# Patient Record
Sex: Female | Born: 1935
Health system: Southern US, Community
[De-identification: ages and names within clinical notes are randomized; demographics above are authoritative.]

## PROBLEM LIST (undated history)

## (undated) DIAGNOSIS — Z8711 Personal history of peptic ulcer disease: Secondary | ICD-10-CM

## (undated) DIAGNOSIS — F419 Anxiety disorder, unspecified: Secondary | ICD-10-CM

## (undated) DIAGNOSIS — Z8719 Personal history of other diseases of the digestive system: Secondary | ICD-10-CM

## (undated) DIAGNOSIS — N390 Urinary tract infection, site not specified: Secondary | ICD-10-CM

## (undated) DIAGNOSIS — M069 Rheumatoid arthritis, unspecified: Secondary | ICD-10-CM

## (undated) DIAGNOSIS — C801 Malignant (primary) neoplasm, unspecified: Secondary | ICD-10-CM

## (undated) DIAGNOSIS — K219 Gastro-esophageal reflux disease without esophagitis: Secondary | ICD-10-CM

## (undated) DIAGNOSIS — N19 Unspecified kidney failure: Secondary | ICD-10-CM

## (undated) DIAGNOSIS — N809 Endometriosis, unspecified: Secondary | ICD-10-CM

## (undated) HISTORY — DX: Personal history of other diseases of the digestive system: Z87.19

## (undated) HISTORY — PX: CATARACT EXTRACTION: SUR2

## (undated) HISTORY — DX: Personal history of peptic ulcer disease: Z87.11

## (undated) HISTORY — DX: Unspecified kidney failure: N19

## (undated) HISTORY — PX: CHOLECYSTECTOMY: SHX55

## (undated) HISTORY — DX: Gastro-esophageal reflux disease without esophagitis: K21.9

## (undated) HISTORY — PX: APPENDECTOMY: SHX54

## (undated) HISTORY — PX: ABDOMINAL HYSTERECTOMY: SHX81

## (undated) HISTORY — PX: TONSILLECTOMY: SUR1361

## (undated) HISTORY — DX: Endometriosis, unspecified: N80.9

## (undated) HISTORY — DX: Urinary tract infection, site not specified: N39.0

## (undated) HISTORY — PX: INNER EAR SURGERY: SHX679

## (undated) HISTORY — PX: COLON SURGERY: SHX602

## (undated) HISTORY — DX: Rheumatoid arthritis, unspecified: M06.9

---

## 2004-05-19 ENCOUNTER — Ambulatory Visit: Payer: Self-pay

## 2005-01-19 ENCOUNTER — Ambulatory Visit: Payer: Self-pay | Admitting: Physician Assistant

## 2006-09-27 ENCOUNTER — Ambulatory Visit: Payer: Self-pay

## 2007-12-23 ENCOUNTER — Ambulatory Visit: Payer: Self-pay

## 2009-05-24 ENCOUNTER — Ambulatory Visit: Payer: Self-pay

## 2010-05-26 ENCOUNTER — Ambulatory Visit: Payer: Self-pay | Admitting: Family Medicine

## 2011-06-01 ENCOUNTER — Ambulatory Visit: Payer: Self-pay | Admitting: Family Medicine

## 2012-06-04 ENCOUNTER — Ambulatory Visit: Payer: Self-pay | Admitting: Family Medicine

## 2012-06-20 ENCOUNTER — Ambulatory Visit: Payer: Self-pay | Admitting: Family Medicine

## 2013-01-22 ENCOUNTER — Ambulatory Visit: Payer: Self-pay | Admitting: Urology

## 2013-02-19 ENCOUNTER — Ambulatory Visit: Payer: Self-pay | Admitting: Obstetrics and Gynecology

## 2013-02-24 ENCOUNTER — Ambulatory Visit: Payer: Self-pay | Admitting: Obstetrics and Gynecology

## 2013-06-24 ENCOUNTER — Ambulatory Visit: Payer: Self-pay | Admitting: Family Medicine

## 2013-07-10 ENCOUNTER — Ambulatory Visit: Payer: Self-pay | Admitting: Gastroenterology

## 2013-07-11 ENCOUNTER — Ambulatory Visit: Payer: Self-pay | Admitting: Family Medicine

## 2013-07-11 LAB — PATHOLOGY REPORT

## 2014-07-17 NOTE — Op Note (Signed)
PATIENT NAME:  Heidi Arias, Heidi Arias MR#:  [id number] DATE OF BIRTH:  1932/03/16  DATE OF PROCEDURE:  02/24/2013  PREOPERATIVE DIAGNOSIS: Suburethral vaginal mass.  POSTOPERATIVE DIAGNOSIS: Suburethral vaginal mass.  PROCEDURE PERFORMED: Cystoscopy with excision of vaginal mass.  ANESTHESIA: General endotracheal.  SURGEON: Rubie Maid, MD   ASSISTANT: Malachi Paradise, MD  ESTIMATED BLOOD LOSS: Minimal.  OPERATIVE FLUIDS: 800 mL.  URINE OUTPUT: 300 mL.  COMPLICATIONS: None.  FINDINGS: A soft, 2 x 1 cm polypoid vascular vaginal mass that was flesh-colored, on the left anterior vaginal wall, near the suburethral region. There was no other drainage expressed. Cystoscopy revealed no gross abnormalities within the bladder, no evidence of suburethral diverticulum.  SPECIMENS: Vaginal mass.  PROCEDURE: The patient was taken to the Operating Room, where she was placed under general anesthesia without difficulty. She was placed in the dorsal lithotomy position, using candy-cane stirrups. She then was prepped and draped in the normal sterile fashion. A straight catheterization was performed. Next, a 0-degree cystoscope was inserted into the urethra and passed through to the bladder. No gross abnormalities were observed. On inspection of the bladder, both ureteral orifices were identified, and  no gross bladder abnormalities were noted.  The urethra was scanned, with no evidence of an ostium or diverticulum, thus confirming origin of mass from vagina.  The cystoscope was then removed from the urethra.  Next, a weighted speculum was then placed into the patient's vagina, and a sidewall retractor was placed on the left for better visualization. The mass was then palpated and then grasped, using an Allis clamp for manipulation. A 15-blade scalpel was used to excise the polypoid mass. After this the bovie was used for hemostasis.  Next, a interrupted sutures of 2-0 chromic were used to close the defect  of the excised mass. Good hemostasis was noted at this point.  The specimen was sent to pathology.   The weighted speculum and all other instruments were removed from the patient's vagina. Instrument and sponge counts were correct x2. The patient tolerated the procedure well, and was taken to the PACU stable condition.     ____________________________ Chesley Noon Marcelline Mates, MD asc:cg D: 02/24/2013 17:44:59 ET T: 02/24/2013 23:56:03 ET JOB#: [id number]  cc: Chesley Noon. Marcelline Mates, MD, <Dictator> Augusto Gamble MD ELECTRONICALLY SIGNED 02/25/2013 16:32

## 2014-09-23 ENCOUNTER — Other Ambulatory Visit: Payer: Self-pay | Admitting: Family Medicine

## 2014-09-23 DIAGNOSIS — Z1231 Encounter for screening mammogram for malignant neoplasm of breast: Secondary | ICD-10-CM

## 2014-09-29 ENCOUNTER — Ambulatory Visit
Admission: RE | Admit: 2014-09-29 | Discharge: 2014-09-29 | Disposition: A | Discharge status: Home / Self Care | Payer: Commercial Managed Care - HMO | Source: Ambulatory Visit | Attending: Family Medicine | Admitting: Family Medicine

## 2014-09-29 DIAGNOSIS — Z1231 Encounter for screening mammogram for malignant neoplasm of breast: Secondary | ICD-10-CM | POA: Insufficient documentation

## 2014-09-29 HISTORY — DX: Malignant (primary) neoplasm, unspecified: C80.1

## 2015-05-19 DIAGNOSIS — H7010 Chronic mastoiditis, unspecified ear: Secondary | ICD-10-CM | POA: Diagnosis not present

## 2015-05-19 DIAGNOSIS — H903 Sensorineural hearing loss, bilateral: Secondary | ICD-10-CM | POA: Diagnosis not present

## 2015-05-20 DIAGNOSIS — Z961 Presence of intraocular lens: Secondary | ICD-10-CM | POA: Diagnosis not present

## 2015-05-27 ENCOUNTER — Encounter: Payer: PRIVATE HEALTH INSURANCE | Admitting: Obstetrics and Gynecology

## 2015-06-17 ENCOUNTER — Encounter: Payer: Self-pay | Admitting: Obstetrics and Gynecology

## 2015-06-17 ENCOUNTER — Ambulatory Visit (INDEPENDENT_AMBULATORY_CARE_PROVIDER_SITE_OTHER): Payer: PRIVATE HEALTH INSURANCE | Admitting: Obstetrics and Gynecology

## 2015-06-17 VITALS — BP 133/72 | HR 76 | Ht 62.0 in | Wt 134.1 lb

## 2015-06-17 DIAGNOSIS — R6889 Other general symptoms and signs: Secondary | ICD-10-CM | POA: Diagnosis not present

## 2015-06-17 DIAGNOSIS — R159 Full incontinence of feces: Secondary | ICD-10-CM | POA: Diagnosis not present

## 2015-06-17 DIAGNOSIS — N952 Postmenopausal atrophic vaginitis: Secondary | ICD-10-CM | POA: Diagnosis not present

## 2015-06-17 DIAGNOSIS — R32 Unspecified urinary incontinence: Secondary | ICD-10-CM | POA: Diagnosis not present

## 2015-06-17 DIAGNOSIS — Z01419 Encounter for gynecological examination (general) (routine) without abnormal findings: Secondary | ICD-10-CM

## 2015-06-17 DIAGNOSIS — Z9181 History of falling: Secondary | ICD-10-CM | POA: Diagnosis not present

## 2015-06-17 NOTE — Patient Instructions (Signed)
Health Maintenance, Female Adopting a healthy lifestyle and getting preventive care can go a long way to promote health and wellness. Talk with your health care provider about what schedule of regular examinations is right for you. This is a good chance for you to check in with your provider about disease prevention and staying healthy. In between checkups, there are plenty of things you can do on your own. Experts have done a lot of research about which lifestyle changes and preventive measures are most likely to keep you healthy. Ask your health care provider for more information. WEIGHT AND DIET  Eat a healthy diet  Be sure to include plenty of vegetables, fruits, low-fat dairy products, and lean protein.  Do not eat a lot of foods high in solid fats, added sugars, or salt.  Get regular exercise. This is one of the most important things you can do for your health.  Most adults should exercise for at least 150 minutes each week. The exercise should increase your heart rate and make you sweat (moderate-intensity exercise).  Most adults should also do strengthening exercises at least twice a week. This is in addition to the moderate-intensity exercise.  Maintain a healthy weight  Body mass index (BMI) is a measurement that can be used to identify possible weight problems. It estimates body fat based on height and weight. Your health care provider can help determine your BMI and help you achieve or maintain a healthy weight.  For females 28 years of age and older:   A BMI below 18.5 is considered underweight.  A BMI of 18.5 to 24.9 is normal.  A BMI of 25 to 29.9 is considered overweight.  A BMI of 30 and above is considered obese.  Watch levels of cholesterol and blood lipids  You should start having your blood tested for lipids and cholesterol at 80 years of age, then have this test every 5 years.  You may need to have your cholesterol levels checked more often if:  Your lipid  or cholesterol levels are high.  You are older than 80 years of age.  You are at high risk for heart disease.  CANCER SCREENING   Lung Cancer  Lung cancer screening is recommended for adults 75-66 years old who are at high risk for lung cancer because of a history of smoking.  A yearly low-dose CT scan of the lungs is recommended for people who:  Currently smoke.  Have quit within the past 15 years.  Have at least a 30-pack-year history of smoking. A pack year is smoking an average of one pack of cigarettes a day for 1 year.  Yearly screening should continue until it has been 15 years since you quit.  Yearly screening should stop if you develop a health problem that would prevent you from having lung cancer treatment.  Breast Cancer  Practice breast self-awareness. This means understanding how your breasts normally appear and feel.  It also means doing regular breast self-exams. Let your health care provider know about any changes, no matter how small.  If you are in your 20s or 30s, you should have a clinical breast exam (CBE) by a health care provider every 1-3 years as part of a regular health exam.  If you are 25 or older, have a CBE every year. Also consider having a breast X-ray (mammogram) every year.  If you have a family history of breast cancer, talk to your health care provider about genetic screening.  If you  are at high risk for breast cancer, talk to your health care provider about having an MRI and a mammogram every year.  Breast cancer gene (BRCA) assessment is recommended for women who have family members with BRCA-related cancers. BRCA-related cancers include:  Breast.  Ovarian.  Tubal.  Peritoneal cancers.  Results of the assessment will determine the need for genetic counseling and BRCA1 and BRCA2 testing. Cervical Cancer Your health care provider may recommend that you be screened regularly for cancer of the pelvic organs (ovaries, uterus, and  vagina). This screening involves a pelvic examination, including checking for microscopic changes to the surface of your cervix (Pap test). You may be encouraged to have this screening done every 3 years, beginning at age 21.  For women ages 30-65, health care providers may recommend pelvic exams and Pap testing every 3 years, or they may recommend the Pap and pelvic exam, combined with testing for human papilloma virus (HPV), every 5 years. Some types of HPV increase your risk of cervical cancer. Testing for HPV may also be done on women of any age with unclear Pap test results.  Other health care providers may not recommend any screening for nonpregnant women who are considered low risk for pelvic cancer and who do not have symptoms. Ask your health care provider if a screening pelvic exam is right for you.  If you have had past treatment for cervical cancer or a condition that could lead to cancer, you need Pap tests and screening for cancer for at least 20 years after your treatment. If Pap tests have been discontinued, your risk factors (such as having a new sexual partner) need to be reassessed to determine if screening should resume. Some women have medical problems that increase the chance of getting cervical cancer. In these cases, your health care provider may recommend more frequent screening and Pap tests. Colorectal Cancer  This type of cancer can be detected and often prevented.  Routine colorectal cancer screening usually begins at 80 years of age and continues through 80 years of age.  Your health care provider may recommend screening at an earlier age if you have risk factors for colon cancer.  Your health care provider may also recommend using home test kits to check for hidden blood in the stool.  A small camera at the end of a tube can be used to examine your colon directly (sigmoidoscopy or colonoscopy). This is done to check for the earliest forms of colorectal  cancer.  Routine screening usually begins at age 50.  Direct examination of the colon should be repeated every 5-10 years through 80 years of age. However, you may need to be screened more often if early forms of precancerous polyps or small growths are found. Skin Cancer  Check your skin from head to toe regularly.  Tell your health care provider about any new moles or changes in moles, especially if there is a change in a mole's shape or color.  Also tell your health care provider if you have a mole that is larger than the size of a pencil eraser.  Always use sunscreen. Apply sunscreen liberally and repeatedly throughout the day.  Protect yourself by wearing long sleeves, pants, a wide-brimmed hat, and sunglasses whenever you are outside. HEART DISEASE, DIABETES, AND HIGH BLOOD PRESSURE   High blood pressure causes heart disease and increases the risk of stroke. High blood pressure is more likely to develop in:  People who have blood pressure in the high end   of the normal range (130-139/85-89 mm Hg).  People who are overweight or obese.  People who are African American.  If you are 38-23 years of age, have your blood pressure checked every 3-5 years. If you are 61 years of age or older, have your blood pressure checked every year. You should have your blood pressure measured twice--once when you are at a hospital or clinic, and once when you are not at a hospital or clinic. Record the average of the two measurements. To check your blood pressure when you are not at a hospital or clinic, you can use:  An automated blood pressure machine at a pharmacy.  A home blood pressure monitor.  If you are between 45 years and 39 years old, ask your health care provider if you should take aspirin to prevent strokes.  Have regular diabetes screenings. This involves taking a blood sample to check your fasting blood sugar level.  If you are at a normal weight and have a low risk for diabetes,  have this test once every three years after 80 years of age.  If you are overweight and have a high risk for diabetes, consider being tested at a younger age or more often. PREVENTING INFECTION  Hepatitis B  If you have a higher risk for hepatitis B, you should be screened for this virus. You are considered at high risk for hepatitis B if:  You were born in a country where hepatitis B is common. Ask your health care provider which countries are considered high risk.  Your parents were born in a high-risk country, and you have not been immunized against hepatitis B (hepatitis B vaccine).  You have HIV or AIDS.  You use needles to inject street drugs.  You live with someone who has hepatitis B.  You have had sex with someone who has hepatitis B.  You get hemodialysis treatment.  You take certain medicines for conditions, including cancer, organ transplantation, and autoimmune conditions. Hepatitis C  Blood testing is recommended for:  Everyone born from [birth year] through 1963.  Anyone with known risk factors for hepatitis C. Sexually transmitted infections (STIs)  You should be screened for sexually transmitted infections (STIs) including gonorrhea and chlamydia if:  You are sexually active and are younger than 80 years of age.  You are older than 80 years of age and your health care provider tells you that you are at risk for this type of infection.  Your sexual activity has changed since you were last screened and you are at an increased risk for chlamydia or gonorrhea. Ask your health care provider if you are at risk.  If you do not have HIV, but are at risk, it may be recommended that you take a prescription medicine daily to prevent HIV infection. This is called pre-exposure prophylaxis (PrEP). You are considered at risk if:  You are sexually active and do not regularly use condoms or know the HIV status of your partner(s).  You take drugs by injection.  You are sexually  active with a partner who has HIV. Talk with your health care provider about whether you are at high risk of being infected with HIV. If you choose to begin PrEP, you should first be tested for HIV. You should then be tested every 3 months for as long as you are taking PrEP.  PREGNANCY   If you are premenopausal and you may become pregnant, ask your health care provider about preconception counseling.  If you may  PrEP). You are considered at risk if:    You are sexually active and do not regularly use condoms or know the HIV status of your partner(s).    You take drugs by injection.    You are sexually  active with a partner who has HIV.  Talk with your health care provider about whether you are at high risk of being infected with HIV. If you choose to begin PrEP, you should first be tested for HIV. You should then be tested every 3 months for as long as you are taking PrEP.   PREGNANCY   · If you are premenopausal and you may become pregnant, ask your health care provider about preconception counseling.  · If you may become pregnant, take 400 to 800 micrograms (mcg) of folic acid every day.  · If you want to prevent pregnancy, talk to your health care provider about birth control (contraception).  OSTEOPOROSIS AND MENOPAUSE   · Osteoporosis is a disease in which the bones lose minerals and strength with aging. This can result in serious bone fractures. Your risk for osteoporosis can be identified using a bone density scan.  · If you are 65 years of age or older, or if you are at risk for osteoporosis and fractures, ask your health care provider if you should be screened.  · Ask your health care provider whether you should take a calcium or vitamin D supplement to lower your risk for osteoporosis.  · Menopause may have certain physical symptoms and risks.  · Hormone replacement therapy may reduce some of these symptoms and risks.  Talk to your health care provider about whether hormone replacement therapy is right for you.   HOME CARE INSTRUCTIONS   · Schedule regular health, dental, and eye exams.  · Stay current with your immunizations.    · Do not use any tobacco products including cigarettes, chewing tobacco, or electronic cigarettes.  · If you are pregnant, do not drink alcohol.  · If you are breastfeeding, limit how much and how often you drink alcohol.  · Limit alcohol intake to no more than 1 drink per day for nonpregnant women. One drink equals 12 ounces of beer, 5 ounces of wine, or 1½ ounces of hard liquor.  · Do not use street drugs.  · Do not share needles.  · Ask your health care provider for help if  you need support or information about quitting drugs.  · Tell your health care provider if you often feel depressed.  · Tell your health care provider if you have ever been abused or do not feel safe at home.     This information is not intended to replace advice given to you by your health care provider. Make sure you discuss any questions you have with your health care provider.     Document Released: 09/26/2010 Document Revised: 04/03/2014 Document Reviewed: 02/12/2013  [employer]vier Interactive Patient Education ©2016 [employer]  Menopause is a normal process in which your reproductive ability comes to an end. This process happens gradually over a span of months to years, usually between the ages of 48 and 55. Menopause is complete when you have missed 12 consecutive menstrual periods.  It is important to talk with your health care provider about some of the most common conditions that affect postmenopausal women, such as heart disease, cancer, and bone loss (osteoporosis). Adopting a healthy lifestyle and getting preventive care can help to promote your health and wellness. Those actions can also   lower your chances of developing some of these common conditions.  WHAT SHOULD I KNOW ABOUT MENOPAUSE?  During menopause, you may experience a number of symptoms, such as:  · Moderate-to-severe hot flashes.  · Night sweats.  · Decrease in sex drive.  · Mood swings.  · Headaches.  · Tiredness.  · Irritability.  · Memory problems.  · Insomnia.  Choosing to treat or not to treat menopausal changes is an individual decision that you make with your health care provider.  WHAT SHOULD I KNOW ABOUT HORMONE REPLACEMENT THERAPY AND SUPPLEMENTS?  Hormone therapy products are effective for treating symptoms that are associated with menopause, such as hot flashes and night sweats. Hormone replacement carries certain risks, especially as you become older. If you are thinking about using estrogen or estrogen with progestin treatments,  discuss the benefits and risks with your health care provider.  WHAT SHOULD I KNOW ABOUT HEART DISEASE AND STROKE?  Heart disease, heart attack, and stroke become more likely as you age. This may be due, in part, to the hormonal changes that your body experiences during menopause. These can affect how your body processes dietary fats, triglycerides, and cholesterol. Heart attack and stroke are both medical emergencies.  There are many things that you can do to help prevent heart disease and stroke:  · Have your blood pressure checked at least every 1-2 years. High blood pressure causes heart disease and increases the risk of stroke.  · If you are 55-79 years old, ask your health care provider if you should take aspirin to prevent a heart attack or a stroke.  · Do not use any tobacco products, including cigarettes, chewing tobacco, or electronic cigarettes. If you need help quitting, ask your health care provider.  · It is important to eat a healthy diet and maintain a healthy weight.    Be sure to include plenty of vegetables, fruits, low-fat dairy products, and lean protein.    Avoid eating foods that are high in solid fats, added sugars, or salt (sodium).  · Get regular exercise. This is one of the most important things that you can do for your health.    Try to exercise for at least 150 minutes each week. The type of exercise that you do should increase your heart rate and make you sweat. This is known as moderate-intensity exercise.    Try to do strengthening exercises at least twice each week. Do these in addition to the moderate-intensity exercise.  · Know your numbers. Ask your health care provider to check your cholesterol and your blood glucose. Continue to have your blood tested as directed by your health care provider.  WHAT SHOULD I KNOW ABOUT CANCER SCREENING?  There are several types of cancer. Take the following steps to reduce your risk and to catch any cancer development as early as  possible.  Breast Cancer  · Practice breast self-awareness.    This means understanding how your breasts normally appear and feel.    It also means doing regular breast self-exams. Let your health care provider know about any changes, no matter how small.  · If you are 40 or older, have a clinician do a breast exam (clinical breast exam or CBE) every year. Depending on your age, family history, and medical history, it may be recommended that you also have a yearly breast X-ray (mammogram).  · If you have a family history of breast cancer, talk with your health care provider about genetic screening.  ·   If you are at high risk for breast cancer, talk with your health care provider about having an MRI and a mammogram every year.  · Breast cancer (BRCA) gene test is recommended for women who have family members with BRCA-related cancers. Results of the assessment will determine the need for genetic counseling and BRCA1 and for BRCA2 testing. BRCA-related cancers include these types:    Breast. This occurs in males or females.    Ovarian.    Tubal. This may also be called fallopian tube cancer.    Cancer of the abdominal or pelvic lining (peritoneal cancer).    Prostate.    Pancreatic.  Cervical, Uterine, and Ovarian Cancer  Your health care provider may recommend that you be screened regularly for cancer of the pelvic organs. These include your ovaries, uterus, and vagina. This screening involves a pelvic exam, which includes checking for microscopic changes to the surface of your cervix (Pap test).  · For women ages 21-65, health care providers may recommend a pelvic exam and a Pap test every three years. For women ages 30-65, they may recommend the Pap test and pelvic exam, combined with testing for human papilloma virus (HPV), every five years. Some types of HPV increase your risk of cervical cancer. Testing for HPV may also be done on women of any age who have unclear Pap test results.  · Other health care providers  may not recommend any screening for nonpregnant women who are considered low risk for pelvic cancer and have no symptoms. Ask your health care provider if a screening pelvic exam is right for you.  · If you have had past treatment for cervical cancer or a condition that could lead to cancer, you need Pap tests and screening for cancer for at least 20 years after your treatment. If Pap tests have been discontinued for you, your risk factors (such as having a new sexual partner) need to be reassessed to determine if you should start having screenings again. Some women have medical problems that increase the chance of getting cervical cancer. In these cases, your health care provider may recommend that you have screening and Pap tests more often.  · If you have a family history of uterine cancer or ovarian cancer, talk with your health care provider about genetic screening.  · If you have vaginal bleeding after reaching menopause, tell your health care provider.  · There are currently no reliable tests available to screen for ovarian cancer.  Lung Cancer  Lung cancer screening is recommended for adults 55-80 years old who are at high risk for lung cancer because of a history of smoking. A yearly low-dose CT scan of the lungs is recommended if you:  · Currently smoke.  · Have a history of at least 30 pack-years of smoking and you currently smoke or have quit within the past 15 years. A pack-year is smoking an average of one pack of cigarettes per day for one year.  Yearly screening should:  · Continue until it has been 15 years since you quit.  · Stop if you develop a health problem that would prevent you from having lung cancer treatment.  Colorectal Cancer  · This type of cancer can be detected and can often be prevented.  · Routine colorectal cancer screening usually begins at age 50 and continues through age 75.  · If you have risk factors for colon cancer, your health care provider may recommend that you be  screened at an earlier age.  ·   If you have a family history of colorectal cancer, talk with your health care provider about genetic screening.  · Your health care provider may also recommend using home test kits to check for hidden blood in your stool.  · A small camera at the end of a tube can be used to examine your colon directly (sigmoidoscopy or colonoscopy). This is done to check for the earliest forms of colorectal cancer.  · Direct examination of the colon should be repeated every 5-10 years until age 75. However, if early forms of precancerous polyps or small growths are found or if you have a family history or genetic risk for colorectal cancer, you may need to be screened more often.  Skin Cancer  · Check your skin from head to toe regularly.  · Monitor any moles. Be sure to tell your health care provider:    About any new moles or changes in moles, especially if there is a change in a mole's shape or color.    If you have a mole that is larger than the size of a pencil eraser.  · If any of your family members has a history of skin cancer, especially at a young age, talk with your health care provider about genetic screening.  · Always use sunscreen. Apply sunscreen liberally and repeatedly throughout the day.  · Whenever you are outside, protect yourself by wearing long sleeves, pants, a wide-brimmed hat, and sunglasses.  WHAT SHOULD I KNOW ABOUT OSTEOPOROSIS?  Osteoporosis is a condition in which bone destruction happens more quickly than new bone creation. After menopause, you may be at an increased risk for osteoporosis. To help prevent osteoporosis or the bone fractures that can happen because of osteoporosis, the following is recommended:  · If you are 19-50 years old, get at least 1,000 mg of calcium and at least 600 mg of vitamin D per day.  · If you are older than age 50 but younger than age 70, get at least 1,200 mg of calcium and at least 600 mg of vitamin D per day.  · If you are older than  age 70, get at least 1,200 mg of calcium and at least 800 mg of vitamin D per day.  Smoking and excessive alcohol intake increase the risk of osteoporosis. Eat foods that are rich in calcium and vitamin D, and do weight-bearing exercises several times each week as directed by your health care provider.  WHAT SHOULD I KNOW ABOUT HOW MENOPAUSE AFFECTS MY MENTAL HEALTH?  Depression may occur at any age, but it is more common as you become older. Common symptoms of depression include:  · Low or sad mood.  · Changes in sleep patterns.  · Changes in appetite or eating patterns.  · Feeling an overall lack of motivation or enjoyment of activities that you previously enjoyed.  · Frequent crying spells.  Talk with your health care provider if you think that you are experiencing depression.  WHAT SHOULD I KNOW ABOUT IMMUNIZATIONS?  It is important that you get and maintain your immunizations. These include:  · Tetanus, diphtheria, and pertussis (Tdap) booster vaccine.  · Influenza every year before the flu season begins.  · Pneumonia vaccine.  · Shingles vaccine.  Your health care provider may also recommend other immunizations.     This information is not intended to replace advice given to you by your health care provider. Make sure you discuss any questions you have with your health care provider.

## 2015-06-17 NOTE — Progress Notes (Signed)
ANNUAL PREVENTATIVE CARE GYN  ENCOUNTER NOTE  Subjective:       Heidi Arias is a 82 y.o. P65 female here for a routine annual gynecologic exam. Denies postmenopausal symptoms.  Is not on hormone replacement therapy.  Denies postmenopausal bleeding. Does not currently participate in regular exercise. The patient is not sexually active. She denies history of domestic violence. Current complaints: 1.  Intermittent mild urinary and bowel incontinence over past 2-3 months.    Gynecologic History No LMP recorded. Patient has had a hysterectomy. Last Pap: cannot recall, but over 30 years ago. Results were: normal Last mammogram: 04/2014. Results were: normal Last Colonoscopy: ~ 2 years ago.  Results were normal.  Obstetric History OB History  Gravida Para Term Preterm AB SAB TAB Ectopic Multiple Living  2 2 2       2     # Outcome Date GA Lbr Len/2nd Weight Sex Delivery Anes PTL Lv  2 Term           1 Term               Past Medical History  Diagnosis Date  . Cancer ([place])     melanoma  . Recurrent UTI (urinary tract infection)   . GERD (gastroesophageal reflux disease)   . Endometriosis   . Rheumatoid arthritis ([place])     History reviewed. No pertinent family history.   Past Surgical History  Procedure Laterality Date  . Appendectomy    . Colon surgery    . Cataract extraction    . Cholecystectomy    . Abdominal hysterectomy    . Tonsillectomy      Social History   Social History  . Marital Status: Widowed    Spouse Name: N/A  . Number of Children: N/A  . Years of Education: N/A   Occupational History  . Not on file.   Social History Main Topics  . Smoking status: Never Smoker   . Smokeless tobacco: Not on file  . Alcohol Use: No  . Drug Use: No  . Sexual Activity: No   Other Topics Concern  . Not on file   Social History Narrative    Outpatient Encounter Prescriptions as of 06/17/2015  Medication Sig Note  . omeprazole (PRILOSEC) 20 MG capsule   06/17/2015: Received from: [facility]  . PREMARIN vaginal cream  06/17/2015: Received from: External Pharmacy  . sertraline (ZOLOFT) 50 MG tablet  06/17/2015: Received from: [facility]  . [DISCONTINUED] omeprazole (PRILOSEC) 20 MG capsule  06/17/2015: Received from: External Pharmacy  . [DISCONTINUED] sertraline (ZOLOFT) 50 MG tablet  06/17/2015: Received from: External Pharmacy   No facility-administered encounter medications on file as of 06/17/2015.     Allergies  Allergen Reactions  . Codeine Sulfate     Other reaction(s): Unknown  . Doxycycline Hyclate Rash  . Penicillin G Rash     The following portions of the patient's history were reviewed and updated as appropriate: allergies, current medications, past family history, past medical history, past social history, past surgical history and problem list.  Review of Systems ROS Review of Systems - General ROS: negative for - chills, fatigue, fever, hot flashes, night sweats, weight gain or weight loss Psychological ROS: negative for - anxiety, decreased libido, depression, mood swings, physical abuse or sexual abuse Ophthalmic ROS: negative for - blurry vision, eye pain or loss of vision ENT ROS: negative for - headaches, hearing change, visual changes or vocal changes Allergy and  Immunology ROS: negative for - hives, itchy/watery eyes or seasonal allergies Hematological and Lymphatic ROS: negative for - bleeding problems, bruising, swollen lymph nodes or weight loss Endocrine ROS: positive for cold intolerance.  negative for - galactorrhea, hair pattern changes, hot flashes, malaise/lethargy, mood swings, palpitations, polydipsia/polyuria, skin changes, temperature intolerance or unexpected weight changes Breast ROS: negative for - new or changing breast lumps or nipple discharge Respiratory ROS: negative for - cough or shortness of breath Cardiovascular ROS: negative for - chest pain, irregular  heartbeat, palpitations or shortness of breath Gastrointestinal ROS: positive for intermittent bowel incontinence.  No abdominal pain, change in bowel habits, or black or bloody stools Genito-Urinary ROS: positive for intermittent urinary incontinence. No dysuria, trouble voiding, or hematuria Musculoskeletal ROS: negative for - joint pain or joint stiffness Neurological ROS: positive for falls. Negative for - bowel and bladder control changes Dermatological ROS: negative for rash and skin lesion changes   Objective:   BP 133/72 mmHg  Pulse 76  Wt 134 lb 1.6 oz (60.827 kg) Body mass index is 24.52 kg/(m^2).  CONSTITUTIONAL: Well-developed, well-nourished female in no acute distress.  PSYCHIATRIC: Normal mood and affect. Normal behavior. Normal judgment and thought content. [place]: Alert and oriented to person, place, and time. Normal muscle tone coordination. No cranial nerve deficit noted. HENT:  Normocephalic, atraumatic, External right and left ear normal. Oropharynx is clear and moist EYES: Conjunctivae and EOM are normal. Pupils are equal, round, and reactive to light. No scleral icterus.  NECK: Normal range of motion, supple, no masses.  Normal thyroid.  SKIN: Skin is warm and dry. No rash noted. Not diaphoretic. No erythema. No pallor. CARDIOVASCULAR: Normal heart rate noted, regular rhythm, no murmur. RESPIRATORY: Clear to auscultation bilaterally. Effort and breath sounds normal, no problems with respiration noted. BREASTS: Symmetric in size. No masses, skin changes, nipple drainage, or lymphadenopathy. ABDOMEN: Soft, normal bowel sounds, no distention noted.  No tenderness, rebound or guarding.  BLADDER: Normal PELVIC:  External Genitalia: Normal  BUS: Normal  Vagina: Vaginal mucosa atrophic. Grade 1-2 cystocele present, Grade 1 rectocele.   Cervix: Surgically absent  Uterus: Surgically absent  Adnexa: Normal  RV: External Exam NormaI, No Rectal Masses and Normal  Sphincter tone  MUSCULOSKELETAL: Normal range of motion. No tenderness.  No cyanosis, clubbing, or edema.  2+ distal pulses. LYMPHATIC: No Axillary, Supraclavicular, or Inguinal Adenopathy.    Assessment:   Annual gynecologic examination 82 y.o. Contraception: status post hysterectomy Normal BMI  Vaginl atrophy Intermittent urinary and bowel incontinence Cold intolerance History of falls  Plan:  Pap: Not needed Mammogram: Completed 04/2014, was normal.  Stool Guaiac Testing:  Not Ordered.  Patient notes colonoscopy within past few years.  Labs: CBC, Complete Metabolic Panel, Vitamin D level, and TSH  DEXA scan ordered. Patient notes that she has never had one. Routine preventative health maintenance measures emphasized: Exercise/Diet/Weight control and Stress Management.  Advised on Vit D and calcium supplementation.  Patient notes using Premarin cream occasionally.  Discussed recent h/o falls (patient notes losing her balance more often).  Has been seen by ENT (has h/o bilateral eustachian tubes).  PCP also aware. Discussed Kegel exercises for incontinence.  Can refer to pelvic physical therapy, or can consider use of pessary.  Patient to schedule appointment if she desires to disucss management further.  Return to [place], MD

## 2015-06-18 LAB — COMPREHENSIVE METABOLIC PANEL
ALK PHOS: 86 IU/L (ref 39–117)
ALT: 6 IU/L (ref 0–32)
AST: 13 IU/L (ref 0–40)
Albumin/Globulin Ratio: 1.5 (ref 1.2–2.2)
Albumin: 4.1 g/dL (ref 3.5–4.7)
BUN/Creatinine Ratio: 20 (ref 11–26)
BUN: 19 mg/dL (ref 8–27)
CHLORIDE: 101 mmol/L (ref 96–106)
CO2: 18 mmol/L (ref 18–29)
Calcium: 9.6 mg/dL (ref 8.7–10.3)
Creatinine, Ser: 0.94 mg/dL (ref 0.57–1.00)
GFR calc Af Amer: 66 mL/min/{1.73_m2} (ref >59)
GFR calc non Af Amer: 57 mL/min/{1.73_m2} — ABNORMAL LOW (ref >59)
GLUCOSE: 81 mg/dL (ref 65–99)
Globulin, Total: 2.8 g/dL (ref 1.5–4.5)
Potassium: 4.7 mmol/L (ref 3.5–5.2)
Sodium: 138 mmol/L (ref 134–144)
TOTAL PROTEIN: 6.9 g/dL (ref 6.0–8.5)

## 2015-06-18 LAB — CBC
Hematocrit: 27.8 % — ABNORMAL LOW (ref 34.0–46.6)
Hemoglobin: 8.4 g/dL — ABNORMAL LOW (ref 11.1–15.9)
MCH: 20.9 pg — AB (ref 26.6–33.0)
MCHC: 30.2 g/dL — ABNORMAL LOW (ref 31.5–35.7)
MCV: 69 fL — ABNORMAL LOW (ref 79–97)
PLATELETS: 514 10*3/uL — AB (ref 150–379)
RBC: 4.02 x10E6/uL (ref 3.77–5.28)
RDW: 16.6 % — AB (ref 12.3–15.4)
WBC: 8.8 10*3/uL (ref 3.4–10.8)

## 2015-06-18 LAB — VITAMIN D 25 HYDROXY (VIT D DEFICIENCY, FRACTURES): VIT D 25 HYDROXY: 15.8 ng/mL — AB (ref 30.0–100.0)

## 2015-06-18 LAB — TSH: TSH: 2.13 u[IU]/mL (ref 0.450–4.500)

## 2015-06-19 ENCOUNTER — Encounter: Payer: Self-pay | Admitting: Obstetrics and Gynecology

## 2015-06-21 ENCOUNTER — Other Ambulatory Visit: Payer: Self-pay

## 2015-06-21 ENCOUNTER — Telehealth: Payer: Self-pay

## 2015-06-21 DIAGNOSIS — E559 Vitamin D deficiency, unspecified: Secondary | ICD-10-CM

## 2015-06-21 DIAGNOSIS — D509 Iron deficiency anemia, unspecified: Secondary | ICD-10-CM

## 2015-06-21 MED ORDER — FERROUS SULFATE 325 (65 FE) MG PO TABS: 325.0000 mg | ORAL_TABLET | Freq: Two times a day (BID) | ORAL | Status: DC

## 2015-06-21 MED ORDER — CALCIUM-MAGNESIUM-VITAMIN D ER 600-40-500 MG-MG-UNIT PO TB24: ORAL_TABLET | ORAL | Status: DC

## 2015-06-21 MED ORDER — FERROUS SULFATE 325 (65 FE) MG PO TABS
325.0000 mg | ORAL_TABLET | Freq: Two times a day (BID) | ORAL | Status: DC
Start: 2015-06-21 — End: 2015-06-21

## 2015-06-21 NOTE — Telephone Encounter (Signed)
-----   Message from Rubie Maid, MD sent at 06/18/2015  8:28 AM EDT ----- Please inform patient of moderate anemia.  Needs to begin taking iron tablets twice daily, and colace for constipation as needed. Should also f/u with PCP for further workup of anemia. Also with low vitamin d levels.  Would benefit from a dailyVit D + calcium daily supplement.

## 2015-06-21 NOTE — Telephone Encounter (Signed)
Called pt informed her of the need for supplements, rx sent in as pt states pharmacy delivers her medications and pt is hard of hearing.

## 2015-07-02 ENCOUNTER — Telehealth: Payer: Self-pay | Admitting: Obstetrics and Gynecology

## 2015-07-02 ENCOUNTER — Other Ambulatory Visit: Payer: Self-pay

## 2015-07-02 NOTE — Telephone Encounter (Signed)
Called Anderson Malta at corrected number [phone]. No answer, LM for Anderson Malta giving her the updated dx code Z87.898.

## 2015-07-02 NOTE — Telephone Encounter (Signed)
Bone Density ordered ..diagnosis code does not qualify for bone density. Please call Anderson Malta @ [place] [phone]

## 2015-07-07 DIAGNOSIS — D508 Other iron deficiency anemias: Secondary | ICD-10-CM | POA: Diagnosis not present

## 2015-07-07 DIAGNOSIS — F419 Anxiety disorder, unspecified: Secondary | ICD-10-CM | POA: Diagnosis not present

## 2015-07-07 DIAGNOSIS — R7989 Other specified abnormal findings of blood chemistry: Secondary | ICD-10-CM | POA: Diagnosis not present

## 2015-09-14 DIAGNOSIS — N390 Urinary tract infection, site not specified: Secondary | ICD-10-CM | POA: Diagnosis not present

## 2015-09-14 DIAGNOSIS — R300 Dysuria: Secondary | ICD-10-CM | POA: Diagnosis not present

## 2015-09-21 DIAGNOSIS — B370 Candidal stomatitis: Secondary | ICD-10-CM | POA: Diagnosis not present

## 2015-10-14 DIAGNOSIS — N390 Urinary tract infection, site not specified: Secondary | ICD-10-CM | POA: Diagnosis not present

## 2015-10-18 ENCOUNTER — Other Ambulatory Visit: Payer: Self-pay | Admitting: Family Medicine

## 2015-10-18 DIAGNOSIS — Z1231 Encounter for screening mammogram for malignant neoplasm of breast: Secondary | ICD-10-CM

## 2015-10-26 ENCOUNTER — Other Ambulatory Visit: Payer: Self-pay | Admitting: Family Medicine

## 2015-10-26 DIAGNOSIS — R1031 Right lower quadrant pain: Secondary | ICD-10-CM | POA: Diagnosis not present

## 2015-10-26 DIAGNOSIS — R1032 Left lower quadrant pain: Secondary | ICD-10-CM | POA: Diagnosis not present

## 2015-10-26 DIAGNOSIS — N390 Urinary tract infection, site not specified: Secondary | ICD-10-CM | POA: Diagnosis not present

## 2015-10-26 DIAGNOSIS — R300 Dysuria: Secondary | ICD-10-CM | POA: Diagnosis not present

## 2015-10-26 DIAGNOSIS — Z8742 Personal history of other diseases of the female genital tract: Secondary | ICD-10-CM

## 2015-10-28 ENCOUNTER — Ambulatory Visit
Admission: RE | Admit: 2015-10-28 | Discharge: 2015-10-28 | Disposition: A | Discharge status: Home / Self Care | Payer: PPO | Source: Ambulatory Visit | Attending: Family Medicine | Admitting: Family Medicine

## 2015-10-28 ENCOUNTER — Other Ambulatory Visit: Payer: Self-pay | Admitting: Family Medicine

## 2015-10-28 DIAGNOSIS — Z8742 Personal history of other diseases of the female genital tract: Secondary | ICD-10-CM | POA: Diagnosis not present

## 2015-10-28 DIAGNOSIS — Z90710 Acquired absence of both cervix and uterus: Secondary | ICD-10-CM | POA: Diagnosis not present

## 2015-10-28 DIAGNOSIS — R1032 Left lower quadrant pain: Principal | ICD-10-CM

## 2015-10-28 DIAGNOSIS — R1031 Right lower quadrant pain: Secondary | ICD-10-CM | POA: Diagnosis not present

## 2015-10-28 DIAGNOSIS — R1030 Lower abdominal pain, unspecified: Secondary | ICD-10-CM | POA: Diagnosis not present

## 2015-11-03 ENCOUNTER — Ambulatory Visit
Admission: RE | Admit: 2015-11-03 | Discharge: 2015-11-03 | Disposition: A | Discharge status: Home / Self Care | Payer: PPO | Source: Ambulatory Visit | Attending: Family Medicine | Admitting: Family Medicine

## 2015-11-03 DIAGNOSIS — Z1231 Encounter for screening mammogram for malignant neoplasm of breast: Secondary | ICD-10-CM | POA: Insufficient documentation

## 2015-11-10 DIAGNOSIS — M79602 Pain in left arm: Secondary | ICD-10-CM | POA: Diagnosis not present

## 2015-11-10 DIAGNOSIS — S42252A Displaced fracture of greater tuberosity of left humerus, initial encounter for closed fracture: Secondary | ICD-10-CM | POA: Diagnosis not present

## 2015-11-16 DIAGNOSIS — H7013 Chronic mastoiditis, bilateral: Secondary | ICD-10-CM | POA: Diagnosis not present

## 2015-11-16 DIAGNOSIS — H903 Sensorineural hearing loss, bilateral: Secondary | ICD-10-CM | POA: Diagnosis not present

## 2016-01-07 DIAGNOSIS — D649 Anemia, unspecified: Secondary | ICD-10-CM | POA: Diagnosis not present

## 2016-01-07 DIAGNOSIS — L989 Disorder of the skin and subcutaneous tissue, unspecified: Secondary | ICD-10-CM | POA: Diagnosis not present

## 2016-01-07 DIAGNOSIS — K219 Gastro-esophageal reflux disease without esophagitis: Secondary | ICD-10-CM | POA: Diagnosis not present

## 2016-01-07 DIAGNOSIS — F419 Anxiety disorder, unspecified: Secondary | ICD-10-CM | POA: Diagnosis not present

## 2016-01-12 ENCOUNTER — Encounter: Payer: Self-pay | Admitting: *Deleted

## 2016-01-12 ENCOUNTER — Observation Stay
Admission: EM | Admit: 2016-01-12 | Discharge: 2016-01-12 | Disposition: A | Discharge status: Home / Self Care | Payer: PPO | Attending: Internal Medicine | Admitting: Internal Medicine

## 2016-01-12 DIAGNOSIS — Z79899 Other long term (current) drug therapy: Secondary | ICD-10-CM | POA: Insufficient documentation

## 2016-01-12 DIAGNOSIS — K219 Gastro-esophageal reflux disease without esophagitis: Secondary | ICD-10-CM | POA: Diagnosis not present

## 2016-01-12 DIAGNOSIS — M069 Rheumatoid arthritis, unspecified: Secondary | ICD-10-CM | POA: Insufficient documentation

## 2016-01-12 DIAGNOSIS — D649 Anemia, unspecified: Secondary | ICD-10-CM

## 2016-01-12 DIAGNOSIS — Z85820 Personal history of malignant melanoma of skin: Secondary | ICD-10-CM | POA: Diagnosis not present

## 2016-01-12 DIAGNOSIS — F419 Anxiety disorder, unspecified: Secondary | ICD-10-CM | POA: Insufficient documentation

## 2016-01-12 DIAGNOSIS — Z87440 Personal history of urinary (tract) infections: Secondary | ICD-10-CM | POA: Insufficient documentation

## 2016-01-12 DIAGNOSIS — D509 Iron deficiency anemia, unspecified: Secondary | ICD-10-CM | POA: Diagnosis present

## 2016-01-12 DIAGNOSIS — R5383 Other fatigue: Secondary | ICD-10-CM | POA: Diagnosis not present

## 2016-01-12 DIAGNOSIS — D508 Other iron deficiency anemias: Secondary | ICD-10-CM | POA: Diagnosis not present

## 2016-01-12 DIAGNOSIS — F329 Major depressive disorder, single episode, unspecified: Secondary | ICD-10-CM | POA: Diagnosis not present

## 2016-01-12 HISTORY — DX: Anxiety disorder, unspecified: F41.9

## 2016-01-12 LAB — COMPREHENSIVE METABOLIC PANEL
ALK PHOS: 68 U/L (ref 38–126)
ALT: 12 U/L — AB (ref 14–54)
ANION GAP: 7 (ref 5–15)
AST: 17 U/L (ref 15–41)
Albumin: 3.6 g/dL (ref 3.5–5.0)
BUN: 13 mg/dL (ref 6–20)
CALCIUM: 8.8 mg/dL — AB (ref 8.9–10.3)
CHLORIDE: 108 mmol/L (ref 101–111)
CO2: 23 mmol/L (ref 22–32)
CREATININE: 0.83 mg/dL (ref 0.44–1.00)
Glucose, Bld: 104 mg/dL — ABNORMAL HIGH (ref 65–99)
Potassium: 3.6 mmol/L (ref 3.5–5.1)
SODIUM: 138 mmol/L (ref 135–145)
Total Bilirubin: 0.2 mg/dL — ABNORMAL LOW (ref 0.3–1.2)
Total Protein: 7 g/dL (ref 6.5–8.1)

## 2016-01-12 LAB — CBC
HCT: 23.5 % — ABNORMAL LOW (ref 35.0–47.0)
HEMOGLOBIN: 7.3 g/dL — AB (ref 12.0–16.0)
MCH: 20.1 pg — ABNORMAL LOW (ref 26.0–34.0)
MCHC: 31 g/dL — ABNORMAL LOW (ref 32.0–36.0)
MCV: 64.7 fL — ABNORMAL LOW (ref 80.0–100.0)
PLATELETS: 445 10*3/uL — AB (ref 150–440)
RBC: 3.63 MIL/uL — AB (ref 3.80–5.20)
RDW: 19 % — ABNORMAL HIGH (ref 11.5–14.5)
WBC: 7 10*3/uL (ref 3.6–11.0)

## 2016-01-12 LAB — LACTATE DEHYDROGENASE: LDH: 155 U/L (ref 98–192)

## 2016-01-12 LAB — RETICULOCYTES
RBC.: 3.73 MIL/uL — AB (ref 3.80–5.20)
RETIC COUNT ABSOLUTE: 41 10*3/uL (ref 19.0–183.0)
RETIC CT PCT: 1.1 % (ref 0.4–3.1)

## 2016-01-12 LAB — ABO/RH: ABO/RH(D): O POS

## 2016-01-12 LAB — VITAMIN B12: VITAMIN B 12: 304 pg/mL (ref 180–914)

## 2016-01-12 LAB — PREPARE RBC (CROSSMATCH)

## 2016-01-12 MED ORDER — IRON (FERROUS GLUCONATE) 256 (28 FE) MG PO TABS: 1.0000 | ORAL_TABLET | Freq: Every day | ORAL | 0 refills | Status: DC

## 2016-01-12 MED ORDER — ESTROGENS, CONJUGATED 0.625 MG/GM VA CREA: 1.0000 | TOPICAL_CREAM | Freq: Every day | VAGINAL | Status: DC

## 2016-01-12 MED ORDER — SERTRALINE HCL 50 MG PO TABS: 50.0000 mg | ORAL_TABLET | Freq: Every day | ORAL | Status: DC

## 2016-01-12 MED ORDER — ACETAMINOPHEN 325 MG PO TABS: 650.0000 mg | ORAL_TABLET | Freq: Four times a day (QID) | ORAL | Status: DC | PRN

## 2016-01-12 MED ORDER — ACETAMINOPHEN 650 MG RE SUPP: 650.0000 mg | Freq: Four times a day (QID) | RECTAL | Status: DC | PRN

## 2016-01-12 MED ORDER — SODIUM CHLORIDE 0.9 % IV SOLN
25.0000 mg | Freq: Once | INTRAVENOUS | Status: DC
  Filled 2016-01-12: qty 0.5

## 2016-01-12 MED ORDER — PANTOPRAZOLE SODIUM 40 MG PO TBEC
40.0000 mg | DELAYED_RELEASE_TABLET | Freq: Every day | ORAL | Status: DC
  Administered 2016-01-12: 40 mg via ORAL
  Filled 2016-01-12: qty 1

## 2016-01-12 MED ORDER — SODIUM CHLORIDE 0.9 % IV SOLN
400.0000 mg | Freq: Once | INTRAVENOUS | Status: AC
  Administered 2016-01-12: 400 mg via INTRAVENOUS
  Filled 2016-01-12: qty 20

## 2016-01-12 MED ORDER — FUROSEMIDE 10 MG/ML IJ SOLN
40.0000 mg | Freq: Once | INTRAMUSCULAR | Status: AC
  Administered 2016-01-12: 40 mg via INTRAVENOUS
  Filled 2016-01-12: qty 4

## 2016-01-12 MED ORDER — SODIUM CHLORIDE 0.9 % IV SOLN: 10.0000 mL/h | Freq: Once | INTRAVENOUS | Status: DC

## 2016-01-12 NOTE — Consult Note (Signed)
[facility] [place] NAME: Heidi Arias    MR#:  [id number]  DATE OF BIRTH:  Nov 18, 1931  DATE OF ADMISSION:  01/12/2016  PRIMARY CARE PHYSICIAN: Sherrin Daisy, MD   REQUESTING/REFERRING PHYSICIAN: Dr Merlyn Lot  CHIEF COMPLAINT:   Chief Complaint  Patient presents with  . Fatigue    HISTORY OF PRESENT ILLNESS:  Heidi Arias  is a 82 y.o. female was told to come in secondary to low blood counts. In the ER her hemoglobin was 7.3. Prior ferritin was 3 as outpatient. Patient does not notice any blood loss. She does feel weak with walking. Her legs cramp at night. She does complain of some fatigue. In the ER she was guaiac negative. Hospitalist services were contacted for further evaluation. I contacted Dr. Vira Agar gastroenterology and he said he could not do procedures today or tomorrow. She can follow-up as outpatient with him next week.  PAST MEDICAL HISTORY:   Past Medical History:  Diagnosis Date  . Anxiety   . Cancer ([place])    melanoma  . Endometriosis   . GERD (gastroesophageal reflux disease)   . Recurrent UTI (urinary tract infection)   . Rheumatoid arthritis ([place])     PAST SURGICAL HISTOIRY:   Past Surgical History:  Procedure Laterality Date  . ABDOMINAL HYSTERECTOMY    . APPENDECTOMY    . CATARACT EXTRACTION    . CHOLECYSTECTOMY    . COLON SURGERY    . TONSILLECTOMY      SOCIAL HISTORY:   Social History  Substance Use Topics  . Smoking status: Never Smoker  . Smokeless tobacco: Not on file  . Alcohol use No    FAMILY HISTORY:   Family History  Problem Relation Age of Onset  . CVA Father   . Breast cancer Neg Hx     DRUG ALLERGIES:   Allergies  Allergen Reactions  . Codeine Sulfate     Other reaction(s): Unknown  . Doxycycline Hyclate Rash  . Penicillin G Rash    REVIEW OF SYSTEMS:  CONSTITUTIONAL: No fever. Positive for fatigue. Cold feeling EYES: No blurred or double vision.  Wears glasses EARS, NOSE, AND THROAT: No tinnitus or ear pain. Has a hearing aid. Positive for runny nose. Positive for sore throat. Occasional dysphagia to solids RESPIRATORY: No cough, shortness of breath, wheezing or hemoptysis.  CARDIOVASCULAR: No chest pain, orthopnea, edema.  GASTROINTESTINAL: No nausea, vomiting, or abdominal pain. Occasional diarrhea GENITOURINARY: No dysuria, hematuria.  ENDOCRINE: No polyuria, nocturia,  HEMATOLOGY: Recent anemia SKIN: No rash or lesion. MUSCULOSKELETAL: Positive for joint pain NEUROLOGIC: No tingling, numbness, weakness.  PSYCHIATRY: No anxiety or depression.   MEDICATIONS AT HOME:   Prior to Admission medications   Medication Sig Start Date End Date Taking? Authorizing Provider  Calcium-Magnesium-Vitamin D [id number] MG-MG-UNIT TB24 Take 1 daily bu mouth 06/21/15   Rubie Maid, MD  ferrous sulfate 325 (65 FE) MG tablet Take 1 tablet (325 mg total) by mouth 2 (two) times daily with a meal. 06/21/15   Rubie Maid, MD  omeprazole (PRILOSEC) 20 MG capsule  05/13/15   Historical Provider, MD  PREMARIN vaginal cream  06/14/15   Historical Provider, MD  sertraline (ZOLOFT) 50 MG tablet  05/13/15   Historical Provider, MD      VITAL SIGNS:  Blood pressure 140/63, pulse 66, temperature 98.6 F (37 C), temperature source Oral, resp. rate 14, height 5\' 2"  (1.575 m), weight 62.6 kg (138 lb), SpO2 100 %.  PHYSICAL EXAMINATION:  GENERAL:  82 y.o.-year-old patient lying in the bed with no acute distress.  EYES: Pupils equal, round, reactive to light and accommodation. No scleral icterus. Extraocular muscles intact.  HEENT: Head atraumatic, normocephalic. Oropharynx and nasopharynx clear.  NECK:  Supple, no jugular venous distention. No thyroid enlargement, no tenderness.  LUNGS: Normal breath sounds bilaterally, no wheezing, rales,rhonchi or crepitation. No use of accessory muscles of respiration.  CARDIOVASCULAR: S1, S2 normal. No murmurs, rubs, or  gallops.  ABDOMEN: Soft, nontender, nondistended. Bowel sounds present. No organomegaly or mass.  EXTREMITIES: No pedal edema, cyanosis, or clubbing.  NEUROLOGIC: Cranial nerves II through XII are intact. Muscle strength 5/5 in all extremities. Sensation intact. Gait not checked.  PSYCHIATRIC: The patient is alert and oriented x 3.  SKIN: No obvious rash, lesion, or ulcer.   LABORATORY PANEL:   CBC  Recent Labs Lab 01/12/16 1044  WBC 7.0  HGB 7.3*  HCT 23.5*  PLT 445*   ------------------------------------------------------------------------------------------------------------------  Chemistries   Recent Labs Lab 01/12/16 1044  NA 138  K 3.6  CL 108  CO2 23  GLUCOSE 104*  BUN 13  CREATININE 0.83  CALCIUM 8.8*  AST 17  ALT 12*  ALKPHOS 68  BILITOT 0.2*   ------------------------------------------------------------------------------------------------------------------   IMPRESSION AND PLAN:   1. Iron deficiency anemia that is symptomatic with fatigue. Patient is guaiac negative. Case discussed with Dr. Vira Agar and he will unable to do an endoscopy today or tomorrow. Can follow-up in the office with him next week. ER physician ordered a transfusion of blood which we will do here in the ER. I will also give IV venofer for 400 mg IV 1. Continue iron as outpatient. 2. GERD and occasional dysphasia on PPI 3. Anxiety on Zoloft 4. Arthritis  All the records are reviewed and case discussed with Consulting provider. Management plans discussed with the patient, family and they are in agreement.  CODE STATUS: Full Code  TOTAL TIME TAKING CARE OF THIS PATIENT: 50 minutes.    Loletha Grayer M.D on 01/12/2016 at 1:42 PM  Between 7am to 6pm - Pager - [phone]  After 6pm go to [email or web address] - password [email or web address]  [facility]  Office  [phone]  CC: Primary care Physician: Sherrin Daisy, MD

## 2016-01-12 NOTE — ED Provider Notes (Signed)
-----------------------------------------   6:37 PM on 01/12/2016 -----------------------------------------   Blood pressure (!) 146/72, pulse 68, temperature 98.4 F (36.9 C), temperature source Oral, resp. rate 19, height 5\' 2"  (1.575 m), weight 138 lb (62.6 kg), SpO2 99 %.  Assuming care from Dr. Quentin Cornwall of [facility] [place] is a 82 y.o. female with a chief complaint of Fatigue .    82 year old female history of deficiency anemia who presented with severe fatigue and weakness. Patient was found to be anemic with hgb 7.3. Patient was initially admitted by Dr. Quentin Cornwall however due to lack of bed availability patient received 1 unit of PRBCs and IV iron infusion in the emergency room. He signed out to me and requested I recheck her vital signs and reassess patient after those infusions were done. Patient has been observed for an hour post infusion. She has no signs or symptoms of allergic reaction. Her vital signs are within normal limits. Patient feels markedly improved. Patient be discharged home with instructions provided by Dr. Quentin Cornwall and close follow-up with primary care doctor.   Rudene Re, MD 01/12/16 1840

## 2016-01-12 NOTE — ED Notes (Signed)
Pt assisted to restroom. Ambulated well with one assist. NAD noted.

## 2016-01-12 NOTE — ED Provider Notes (Signed)
[facility] Emergency Department Provider Note    First MD Initiated Contact with Patient 01/12/16 1156     (approximate)  I have reviewed the triage vital signs and the nursing notes.   HISTORY  Chief Complaint Fatigue    HPI Heidi Arias is a 82 y.o. female with a history of iron deficiency anemia presents with severe fatigue and weakness. Patient presented from primary care due to acutely abnormal low hemoglobin and report of worsening symptomatic anemia. Denies any melena or hematochezia. Does have a history of iron deficiency anemia. Denies any history of recurrent malignancy or lymphomas. Denies any shortness of breath or chest pain. Denies any fevers. No abdominal pain. History of diverticulitis.   Past Medical History:  Diagnosis Date  . Cancer ([place])    melanoma  . Endometriosis   . GERD (gastroesophageal reflux disease)   . Recurrent UTI (urinary tract infection)   . Rheumatoid arthritis ([place])     There are no active problems to display for this patient.   Past Surgical History:  Procedure Laterality Date  . ABDOMINAL HYSTERECTOMY    . APPENDECTOMY    . CATARACT EXTRACTION    . CHOLECYSTECTOMY    . COLON SURGERY    . TONSILLECTOMY      Prior to Admission medications   Medication Sig Start Date End Date Taking? Authorizing Provider  Calcium-Magnesium-Vitamin D [id number] MG-MG-UNIT TB24 Take 1 daily bu mouth 06/21/15   Rubie Maid, MD  ferrous sulfate 325 (65 FE) MG tablet Take 1 tablet (325 mg total) by mouth 2 (two) times daily with a meal. 06/21/15   Rubie Maid, MD  omeprazole (PRILOSEC) 20 MG capsule  05/13/15   Historical Provider, MD  PREMARIN vaginal cream  06/14/15   Historical Provider, MD  sertraline (ZOLOFT) 50 MG tablet  05/13/15   Historical Provider, MD    Allergies Codeine sulfate; Doxycycline hyclate; and Penicillin g  Family History  Problem Relation Age of Onset  . Breast cancer Neg Hx     Social  History Social History  Substance Use Topics  . Smoking status: Never Smoker  . Smokeless tobacco: Not on file  . Alcohol use No    Review of Systems Patient denies headaches, rhinorrhea, blurry vision, numbness, shortness of breath, chest pain, edema, cough, abdominal pain, nausea, vomiting, diarrhea, dysuria, fevers, rashes or hallucinations unless otherwise stated above in HPI. ____________________________________________   PHYSICAL EXAM:  VITAL SIGNS: Vitals:   01/12/16 1231 01/12/16 1232  BP: 140/63   Pulse:  66  Resp: 13 14  Temp:      Constitutional: Alert and oriented. Well appearing and in no acute distress. Eyes: Conjunctivae are pale. PERRL. EOMI. Head: Atraumatic. Nose: No congestion/rhinnorhea. Mouth/Throat: Mucous membranes are moist.  Oropharynx non-erythematous. Neck: No stridor. Painless ROM. No cervical spine tenderness to palpation Hematological/Lymphatic/Immunilogical: No cervical lymphadenopathy. Cardiovascular: Normal rate, regular rhythm. Grossly normal heart sounds.  Good peripheral circulation. Respiratory: Normal respiratory effort.  No retractions. Lungs CTAB. Gastrointestinal: Soft and nontender. No distention. No abdominal bruits. No CVA tenderness. Genitourinary:  Musculoskeletal: No lower extremity tenderness nor edema.  No joint effusions. Neurologic:  Normal speech and language. No gross focal neurologic deficits are appreciated. No gait instability. Skin:  Skin is warm, dry and intact. Pale , No rash noted. Psychiatric: Mood and affect are normal. Speech and behavior are normal. _______________________________________   LABS (all labs ordered are listed, but only abnormal results are displayed)  Results for orders placed  or performed during the hospital encounter of 01/12/16 (from the past 24 hour(s))  Comprehensive metabolic panel     Status: Abnormal   Collection Time: 01/12/16 10:44 AM  Result Value Ref Range   Sodium 138 135 - 145  mmol/L   Potassium 3.6 3.5 - 5.1 mmol/L   Chloride 108 101 - 111 mmol/L   CO2 23 22 - 32 mmol/L   Glucose, Bld 104 (H) 65 - 99 mg/dL   BUN 13 6 - 20 mg/dL   Creatinine, Ser 0.83 0.44 - 1.00 mg/dL   Calcium 8.8 (L) 8.9 - 10.3 mg/dL   Total Protein 7.0 6.5 - 8.1 g/dL   Albumin 3.6 3.5 - 5.0 g/dL   AST 17 15 - 41 U/L   ALT 12 (L) 14 - 54 U/L   Alkaline Phosphatase 68 38 - 126 U/L   Total Bilirubin 0.2 (L) 0.3 - 1.2 mg/dL   GFR calc non Af Amer >60 >60 mL/min   GFR calc Af Amer >60 >60 mL/min   Anion gap 7 5 - 15  CBC     Status: Abnormal   Collection Time: 01/12/16 10:44 AM  Result Value Ref Range   WBC 7.0 3.6 - 11.0 K/uL   RBC 3.63 (L) 3.80 - 5.20 MIL/uL   Hemoglobin 7.3 (L) 12.0 - 16.0 g/dL   HCT 23.5 (L) 35.0 - 47.0 %   MCV 64.7 (L) 80.0 - 100.0 fL   MCH 20.1 (L) 26.0 - 34.0 pg   MCHC 31.0 (L) 32.0 - 36.0 g/dL   RDW 19.0 (H) 11.5 - 14.5 %   Platelets 445 (H) 150 - 440 K/uL  Type and screen [facility]     Status: None   Collection Time: 01/12/16 10:45 AM  Result Value Ref Range   ABO/RH(D) O POS    Antibody Screen NEG    Sample Expiration 01/15/2016    ____________________________________________ ____________________________________________  RADIOLOGY   ____________________________________________   PROCEDURES  Procedure(s) performed: none    Critical Care performed: no ____________________________________________   INITIAL IMPRESSION / ASSESSMENT AND PLAN / ED COURSE  Pertinent labs & imaging results that were available during my care of the patient were reviewed by me and considered in my medical decision making (see chart for details).  DDX: ida, gi bleed, electrolyte abn,  malignancy  Heidi Arias is a 82 y.o. who presents to the ED with central back anemia. No evidence of GI bleed as she is guaiac negative. She is otherwise he mechanically stable but is having symptoms. Review of her chart does show profound iron  deficiency anemia. Based on her symptoms will transfuse 1 unit as well as give a test dose of IV iron. Patient will be admitted for further evaluation and management.  Have discussed with the patient and available family all diagnostics and treatments performed thus far and all questions were answered to the best of my ability. The patient demonstrates understanding and agreement with plan.   Clinical Course     ____________________________________________   FINAL CLINICAL IMPRESSION(S) / ED DIAGNOSES  Final diagnoses:  Other iron deficiency anemia  Symptomatic anemia      NEW MEDICATIONS STARTED DURING THIS VISIT:  New Prescriptions   No medications on file     Note:  This document was prepared using Dragon voice recognition software and may include unintentional dictation errors.    Merlyn Lot, MD 01/12/16 [phone]

## 2016-01-12 NOTE — ED Triage Notes (Signed)
Pt sent by Dr Lavone Neri for a hemglobin of 6, pt complains of generalized weakness

## 2016-01-12 NOTE — ED Notes (Signed)
Hemoccult stool negative

## 2016-01-13 LAB — TYPE AND SCREEN
ABO/RH(D): O POS
Antibody Screen: NEGATIVE
Unit division: 0

## 2016-01-13 LAB — HAPTOGLOBIN: HAPTOGLOBIN: 232 mg/dL — AB (ref 34–200)

## 2016-01-19 DIAGNOSIS — K219 Gastro-esophageal reflux disease without esophagitis: Secondary | ICD-10-CM | POA: Diagnosis not present

## 2016-01-19 DIAGNOSIS — D509 Iron deficiency anemia, unspecified: Secondary | ICD-10-CM | POA: Diagnosis not present

## 2016-01-19 DIAGNOSIS — Z8619 Personal history of other infectious and parasitic diseases: Secondary | ICD-10-CM | POA: Diagnosis not present

## 2016-01-27 DIAGNOSIS — L821 Other seborrheic keratosis: Secondary | ICD-10-CM | POA: Diagnosis not present

## 2016-01-27 DIAGNOSIS — L570 Actinic keratosis: Secondary | ICD-10-CM | POA: Diagnosis not present

## 2016-01-28 DIAGNOSIS — D509 Iron deficiency anemia, unspecified: Secondary | ICD-10-CM | POA: Diagnosis not present

## 2016-02-03 DIAGNOSIS — M79602 Pain in left arm: Secondary | ICD-10-CM | POA: Diagnosis not present

## 2016-02-03 DIAGNOSIS — S42255D Nondisplaced fracture of greater tuberosity of left humerus, subsequent encounter for fracture with routine healing: Secondary | ICD-10-CM | POA: Diagnosis not present

## 2016-02-03 DIAGNOSIS — S46002A Unspecified injury of muscle(s) and tendon(s) of the rotator cuff of left shoulder, initial encounter: Secondary | ICD-10-CM | POA: Diagnosis not present

## 2016-02-05 ENCOUNTER — Encounter: Payer: Self-pay | Admitting: Emergency Medicine

## 2016-02-05 ENCOUNTER — Emergency Department: Payer: PPO

## 2016-02-05 ENCOUNTER — Emergency Department
Admission: EM | Admit: 2016-02-05 | Discharge: 2016-02-05 | Disposition: A | Discharge status: Home / Self Care | Payer: PPO | Attending: Emergency Medicine | Admitting: Emergency Medicine

## 2016-02-05 DIAGNOSIS — Z79899 Other long term (current) drug therapy: Secondary | ICD-10-CM | POA: Insufficient documentation

## 2016-02-05 DIAGNOSIS — N390 Urinary tract infection, site not specified: Secondary | ICD-10-CM | POA: Diagnosis not present

## 2016-02-05 DIAGNOSIS — W19XXXA Unspecified fall, initial encounter: Secondary | ICD-10-CM | POA: Diagnosis not present

## 2016-02-05 DIAGNOSIS — Y929 Unspecified place or not applicable: Secondary | ICD-10-CM | POA: Diagnosis not present

## 2016-02-05 DIAGNOSIS — W1839XA Other fall on same level, initial encounter: Secondary | ICD-10-CM | POA: Diagnosis not present

## 2016-02-05 DIAGNOSIS — Y939 Activity, unspecified: Secondary | ICD-10-CM | POA: Insufficient documentation

## 2016-02-05 DIAGNOSIS — M25551 Pain in right hip: Secondary | ICD-10-CM | POA: Diagnosis not present

## 2016-02-05 DIAGNOSIS — S79911A Unspecified injury of right hip, initial encounter: Secondary | ICD-10-CM | POA: Diagnosis not present

## 2016-02-05 DIAGNOSIS — Y999 Unspecified external cause status: Secondary | ICD-10-CM | POA: Diagnosis not present

## 2016-02-05 DIAGNOSIS — Z8579 Personal history of other malignant neoplasms of lymphoid, hematopoietic and related tissues: Secondary | ICD-10-CM | POA: Insufficient documentation

## 2016-02-05 DIAGNOSIS — G9389 Other specified disorders of brain: Secondary | ICD-10-CM | POA: Diagnosis not present

## 2016-02-05 DIAGNOSIS — R319 Hematuria, unspecified: Secondary | ICD-10-CM

## 2016-02-05 DIAGNOSIS — M545 Low back pain: Secondary | ICD-10-CM | POA: Diagnosis not present

## 2016-02-05 DIAGNOSIS — S3992XA Unspecified injury of lower back, initial encounter: Secondary | ICD-10-CM | POA: Diagnosis not present

## 2016-02-05 DIAGNOSIS — S0990XA Unspecified injury of head, initial encounter: Secondary | ICD-10-CM | POA: Diagnosis not present

## 2016-02-05 LAB — CBC WITH DIFFERENTIAL/PLATELET
BASOS PCT: 1 %
Basophils Absolute: 0.1 10*3/uL (ref 0–0.1)
EOS ABS: 0 10*3/uL (ref 0–0.7)
Eosinophils Relative: 0 %
HCT: 36.2 % (ref 35.0–47.0)
HEMOGLOBIN: 11.3 g/dL — AB (ref 12.0–16.0)
Lymphocytes Relative: 8 %
Lymphs Abs: 1 10*3/uL (ref 1.0–3.6)
MCH: 23 pg — ABNORMAL LOW (ref 26.0–34.0)
MCHC: 31.1 g/dL — AB (ref 32.0–36.0)
MCV: 73.8 fL — ABNORMAL LOW (ref 80.0–100.0)
MONOS PCT: 7 %
Monocytes Absolute: 0.9 10*3/uL (ref 0.2–0.9)
NEUTROS PCT: 84 %
Neutro Abs: 10.8 10*3/uL — ABNORMAL HIGH (ref 1.4–6.5)
Platelets: 424 10*3/uL (ref 150–440)
RBC: 4.9 MIL/uL (ref 3.80–5.20)
RDW: 28.5 % — AB (ref 11.5–14.5)
WBC: 12.9 10*3/uL — ABNORMAL HIGH (ref 3.6–11.0)

## 2016-02-05 LAB — URINALYSIS COMPLETE WITH MICROSCOPIC (ARMC ONLY)
Bilirubin Urine: NEGATIVE
GLUCOSE, UA: 150 mg/dL — AB
Nitrite: NEGATIVE
Protein, ur: 100 mg/dL — AB
Specific Gravity, Urine: 1.018 (ref 1.005–1.030)
pH: 6 (ref 5.0–8.0)

## 2016-02-05 LAB — CK: CK TOTAL: 992 U/L — AB (ref 38–234)

## 2016-02-05 LAB — BASIC METABOLIC PANEL
ANION GAP: 9 (ref 5–15)
BUN: 16 mg/dL (ref 6–20)
CALCIUM: 9.2 mg/dL (ref 8.9–10.3)
CO2: 23 mmol/L (ref 22–32)
Chloride: 101 mmol/L (ref 101–111)
Creatinine, Ser: 0.78 mg/dL (ref 0.44–1.00)
GFR calc Af Amer: >60 mL/min (ref >60)
GLUCOSE: 117 mg/dL — AB (ref 65–99)
Potassium: 3.3 mmol/L — ABNORMAL LOW (ref 3.5–5.1)
Sodium: 133 mmol/L — ABNORMAL LOW (ref 135–145)

## 2016-02-05 LAB — TROPONIN I: TROPONIN I: 0.03 ng/mL — AB (ref <0.03)

## 2016-02-05 MED ORDER — CEFTRIAXONE SODIUM-DEXTROSE 1-3.74 GM-% IV SOLR
1.0000 g | Freq: Once | INTRAVENOUS | Status: AC
  Administered 2016-02-05: 1 g via INTRAVENOUS
  Filled 2016-02-05: qty 50

## 2016-02-05 MED ORDER — DEXTROSE 5 % IV SOLN: 1.0000 g | Freq: Once | INTRAVENOUS | Status: DC

## 2016-02-05 MED ORDER — CEPHALEXIN 500 MG PO CAPS: 500.0000 mg | ORAL_CAPSULE | Freq: Three times a day (TID) | ORAL | 0 refills | Status: DC

## 2016-02-05 MED ORDER — SODIUM CHLORIDE 0.9 % IV BOLUS (SEPSIS)
1000.0000 mL | Freq: Once | INTRAVENOUS | Status: AC
  Administered 2016-02-05: 1000 mL via INTRAVENOUS

## 2016-02-05 NOTE — ED Notes (Signed)
Pt recently completed course of Pylera for H. Pylori infection.

## 2016-02-05 NOTE — ED Notes (Signed)

## 2016-02-05 NOTE — ED Triage Notes (Signed)
Pt presents to ED via [facility] c/o fall occurring last night around 1800. Pt has been on floor all night long. States she felt her leg give out from under her, slid to floor and couldn't get back up. C/o pain to lower back and bil hips. Ambulatory with EMS per report. Denies hitting head, denies LOC. Finished abt treatment for "bacterial infection" yesterday.

## 2016-02-05 NOTE — ED Provider Notes (Signed)
[facility] Emergency Department Provider Note   ____________________________________________   I have reviewed the triage vital signs and the nursing notes.   HISTORY  Chief Complaint Fall   History limited by: Not Limited   HPI Heidi Arias is a 82 y.o. female who presents to ED after a fall that occurred yesterday. Patient states she was bending forward when she fell. She denies any chest pain, shortness of breath or lightheadedness during the fall. She was on the ground until EMS came this morning. She states she normally cannot get up off the ground when she falls. She denies hitting her head. She is complaining of some pain in her lower back and right hip although she was able to get up and put weight on when she was being transferred by EMS. The patient has not had any recent fevers. Was recently treated for h. Pylori.   Past Medical History:  Diagnosis Date  . Anxiety   . Cancer ([place])    melanoma  . Endometriosis   . GERD (gastroesophageal reflux disease)   . Recurrent UTI (urinary tract infection)   . Rheumatoid arthritis [facility])     Patient Active Problem List   Diagnosis Date Noted  . Iron deficiency anemia 01/12/2016    Past Surgical History:  Procedure Laterality Date  . ABDOMINAL HYSTERECTOMY    . APPENDECTOMY    . CATARACT EXTRACTION    . CHOLECYSTECTOMY    . COLON SURGERY    . TONSILLECTOMY      Prior to Admission medications   Medication Sig Start Date End Date Taking? Authorizing Provider  Iron, Ferrous Gluconate, 256 (28 Fe) MG TABS Take 1 tablet by mouth daily. 01/12/16  Yes Merlyn Lot, MD  omeprazole (PRILOSEC) 20 MG capsule Take 20 mg by mouth daily.  05/13/15  Yes Historical Provider, MD  omeprazole (PRILOSEC) 40 MG capsule Take 1 capsule by mouth 2 (two) times daily. Take 30 minutes before breakfast and take 30 minutes before dinner. 01/19/16  Yes Historical Provider, MD  PREMARIN vaginal cream  06/14/15   Yes Historical Provider, MD  sertraline (ZOLOFT) 50 MG tablet  05/13/15  Yes Historical Provider, MD    Allergies Codeine sulfate; Doxycycline hyclate; and Penicillin g  Family History  Problem Relation Age of Onset  . CVA Father   . Breast cancer Neg Hx     Social History Social History  Substance Use Topics  . Smoking status: Never Smoker  . Smokeless tobacco: Never Used  . Alcohol use No    Review of Systems  Constitutional: Negative for fever. Cardiovascular: Negative for chest pain. Respiratory: Negative for shortness of breath. Gastrointestinal: Negative for abdominal pain, vomiting. Musculoskeletal: Positive for right hip and lower back pain. Skin: Negative for rash. Neurological: Negative for headaches, focal weakness or numbness.  10-point ROS otherwise negative.  ____________________________________________   PHYSICAL EXAM:  VITAL SIGNS: ED Triage Vitals  Enc Vitals Group     BP 02/05/16 0954 120/68     Pulse Rate 02/05/16 0954 72     Resp 02/05/16 0954 13     Temp 02/05/16 0954 98 F (36.7 C)     Temp Source 02/05/16 0954 Oral     SpO2 02/05/16 0954 99 %     Weight 02/05/16 0955 138 lb (62.6 kg)     Height 02/05/16 0955 5\' 2"  (1.575 m)   Constitutional: Alert. Well appearing and in no distress. Eyes: Conjunctivae are normal. Normal extraocular movements. ENT  Head: Normocephalic and atraumatic.   Nose: No congestion/rhinnorhea.   Mouth/Throat: Mucous membranes are moist.   Neck: No stridor. Hematological/Lymphatic/Immunilogical: No cervical lymphadenopathy. Cardiovascular: Normal rate, regular rhythm.  No murmurs, rubs, or gallops.  Respiratory: Normal respiratory effort without tachypnea nor retractions. Breath sounds are clear and equal bilaterally. No wheezes/rales/rhonchi. Gastrointestinal: Soft and nontender. No distention.  Genitourinary: Deferred Musculoskeletal: Normal range of motion in all extremities. No lower extremity  edema. Neurologic:  Normal speech and language. Some confusion. No gross focal neurologic deficits are appreciated.  Skin:  Skin is warm, dry and intact. No rash noted. Psychiatric: Mood and affect are normal. Speech and behavior are normal. Patient exhibits appropriate insight and judgment.  ____________________________________________    LABS (pertinent positives/negatives)  Labs Reviewed  CBC WITH DIFFERENTIAL/PLATELET - Abnormal; Notable for the following:       Result Value   WBC 12.9 (*)    Hemoglobin 11.3 (*)    MCV 73.8 (*)    MCH 23.0 (*)    MCHC 31.1 (*)    RDW 28.5 (*)    Neutro Abs 10.8 (*)    All other components within normal limits  CK - Abnormal; Notable for the following:    Total CK 992 (*)    All other components within normal limits  TROPONIN I - Abnormal; Notable for the following:    Troponin I 0.03 (*)    All other components within normal limits  BASIC METABOLIC PANEL - Abnormal; Notable for the following:    Sodium 133 (*)    Potassium 3.3 (*)    Glucose, Bld 117 (*)    All other components within normal limits  URINALYSIS COMPLETEWITH MICROSCOPIC ([facility] ONLY) - Abnormal; Notable for the following:    Color, Urine YELLOW (*)    APPearance CLOUDY (*)    Glucose, UA 150 (*)    Ketones, ur TRACE (*)    Hgb urine dipstick 2+ (*)    Protein, ur 100 (*)    Leukocytes, UA 3+ (*)    Bacteria, UA RARE (*)    Squamous Epithelial / LPF 6-30 (*)    All other components within normal limits  URINE CULTURE     ____________________________________________   EKG  None  ____________________________________________    RADIOLOGY  CT head IMPRESSION:  No acute intracranial pathology.      Lumbar spine x-ray IMPRESSION:  1. No appreciable fracture or spondylolisthesis in the lumbar spine,  with limitations as described.  2. Moderate to severe degenerative disc disease throughout the  lumbar spine.  3. Mild symmetric facet arthropathy.       Right hip x-ray IMPRESSION:  No acute osseous injury of the right hip.      ____________________________________________   PROCEDURES  Procedures  ____________________________________________   INITIAL IMPRESSION / ASSESSMENT AND PLAN / ED COURSE  Pertinent labs & imaging results that were available during my care of the patient were reviewed by me and considered in my medical decision making (see chart for details).  Patient presented to the emergency department today after a fall and was found on the ground. She was on the ground for a number of hours. CK was slightly elevated at 992. She was given IV fluids for this. Creatinine is not elevated. Urine hemoglobin was positive. Patient also had white blood cells in the urine. Will give dose of IV antibiotics here. We will start on course of oral antibiotics for likely urinary tract infection. ____________________________________________   FINAL CLINICAL  IMPRESSION(S) / ED DIAGNOSES  Final diagnoses:  Fall, initial encounter  Urinary tract infection with hematuria, site unspecified     Note: This dictation was prepared with Dragon dictation. Any transcriptional errors that result from this process are unintentional    Nance Pear, MD 02/05/16 1552

## 2016-02-05 NOTE — Discharge Instructions (Signed)
Please seek medical attention for any high fevers, chest pain, shortness of breath, change in behavior, persistent vomiting, bloody stool or any other new or concerning symptoms.  

## 2016-02-06 LAB — URINE CULTURE

## 2016-02-24 DIAGNOSIS — L570 Actinic keratosis: Secondary | ICD-10-CM | POA: Diagnosis not present

## 2016-02-24 DIAGNOSIS — R300 Dysuria: Secondary | ICD-10-CM | POA: Diagnosis not present

## 2016-03-14 ENCOUNTER — Encounter: Payer: Self-pay | Admitting: *Deleted

## 2016-03-15 ENCOUNTER — Ambulatory Visit: Payer: PPO | Admitting: Anesthesiology

## 2016-03-15 ENCOUNTER — Ambulatory Visit
Admission: RE | Admit: 2016-03-15 | Discharge: 2016-03-15 | Disposition: A | Discharge status: Home / Self Care | Payer: PPO | Source: Ambulatory Visit | Attending: Unknown Physician Specialty | Admitting: Unknown Physician Specialty

## 2016-03-15 ENCOUNTER — Encounter
Admission: RE | Disposition: A | Discharge status: Home / Self Care | Payer: Self-pay | Source: Ambulatory Visit | Attending: Unknown Physician Specialty

## 2016-03-15 ENCOUNTER — Encounter: Payer: Self-pay | Admitting: *Deleted

## 2016-03-15 DIAGNOSIS — K219 Gastro-esophageal reflux disease without esophagitis: Secondary | ICD-10-CM | POA: Insufficient documentation

## 2016-03-15 DIAGNOSIS — K2950 Unspecified chronic gastritis without bleeding: Secondary | ICD-10-CM | POA: Diagnosis not present

## 2016-03-15 DIAGNOSIS — Z880 Allergy status to penicillin: Secondary | ICD-10-CM | POA: Insufficient documentation

## 2016-03-15 DIAGNOSIS — Z87440 Personal history of urinary (tract) infections: Secondary | ICD-10-CM | POA: Diagnosis not present

## 2016-03-15 DIAGNOSIS — M069 Rheumatoid arthritis, unspecified: Secondary | ICD-10-CM | POA: Insufficient documentation

## 2016-03-15 DIAGNOSIS — F419 Anxiety disorder, unspecified: Secondary | ICD-10-CM | POA: Insufficient documentation

## 2016-03-15 DIAGNOSIS — K2960 Other gastritis without bleeding: Secondary | ICD-10-CM | POA: Diagnosis not present

## 2016-03-15 DIAGNOSIS — D509 Iron deficiency anemia, unspecified: Secondary | ICD-10-CM | POA: Diagnosis not present

## 2016-03-15 DIAGNOSIS — Z85820 Personal history of malignant melanoma of skin: Secondary | ICD-10-CM | POA: Diagnosis not present

## 2016-03-15 DIAGNOSIS — K2990 Gastroduodenitis, unspecified, without bleeding: Secondary | ICD-10-CM | POA: Diagnosis not present

## 2016-03-15 DIAGNOSIS — K2970 Gastritis, unspecified, without bleeding: Secondary | ICD-10-CM | POA: Insufficient documentation

## 2016-03-15 DIAGNOSIS — K449 Diaphragmatic hernia without obstruction or gangrene: Secondary | ICD-10-CM | POA: Diagnosis not present

## 2016-03-15 DIAGNOSIS — K3189 Other diseases of stomach and duodenum: Secondary | ICD-10-CM | POA: Diagnosis not present

## 2016-03-15 DIAGNOSIS — F418 Other specified anxiety disorders: Secondary | ICD-10-CM | POA: Diagnosis not present

## 2016-03-15 DIAGNOSIS — K2980 Duodenitis without bleeding: Secondary | ICD-10-CM | POA: Diagnosis not present

## 2016-03-15 DIAGNOSIS — D219 Benign neoplasm of connective and other soft tissue, unspecified: Secondary | ICD-10-CM | POA: Diagnosis not present

## 2016-03-15 HISTORY — PX: ESOPHAGOGASTRODUODENOSCOPY (EGD) WITH PROPOFOL: SHX5813

## 2016-03-15 SURGERY — ESOPHAGOGASTRODUODENOSCOPY (EGD) WITH PROPOFOL
Anesthesia: General

## 2016-03-15 MED ORDER — PROPOFOL 10 MG/ML IV BOLUS
INTRAVENOUS | Status: AC
  Filled 2016-03-15: qty 20

## 2016-03-15 MED ORDER — SODIUM CHLORIDE 0.9 % IV SOLN: INTRAVENOUS | Status: DC

## 2016-03-15 MED ORDER — PROPOFOL 10 MG/ML IV BOLUS
INTRAVENOUS | Status: DC | PRN
  Administered 2016-03-15: 90 mg via INTRAVENOUS
  Administered 2016-03-15: 10 mg via INTRAVENOUS

## 2016-03-15 MED ORDER — SODIUM CHLORIDE 0.9 % IV SOLN
INTRAVENOUS | Status: DC
  Administered 2016-03-15: 10:00:00 via INTRAVENOUS

## 2016-03-15 NOTE — Transfer of Care (Signed)
Immediate Anesthesia Transfer of Care Note  Patient: Halsey Neal  Procedure(s) Performed: Procedure(s): ESOPHAGOGASTRODUODENOSCOPY (EGD) WITH PROPOFOL (N/A)  Patient Location: PACU and Endoscopy Unit  Anesthesia Type:General  Level of Consciousness: awake, alert  and oriented  Airway & Oxygen Therapy: Patient Spontanous Breathing and Patient connected to nasal cannula oxygen  Post-op Assessment: Report given to RN and Post -op Vital signs reviewed and stable  Post vital signs: Reviewed and stable  Last Vitals:  Vitals:   03/15/16 1040 03/15/16 1041  BP: (!) 117/54 (!) 117/54  Pulse: 73 74  Resp: 17 16  Temp: 36.5 C     Last Pain:  Vitals:   03/15/16 0951  TempSrc: Tympanic         Complications: No apparent anesthesia complications

## 2016-03-15 NOTE — Anesthesia Postprocedure Evaluation (Signed)
Anesthesia Post Note  Patient: Heidi Arias  Procedure(s) Performed: Procedure(s) (LRB): ESOPHAGOGASTRODUODENOSCOPY (EGD) WITH PROPOFOL (N/A)  Patient location during evaluation: PACU Anesthesia Type: General Level of consciousness: awake Pain management: pain level controlled Vital Signs Assessment: post-procedure vital signs reviewed and stable Cardiovascular status: stable Anesthetic complications: no     Last Vitals:  Vitals:   03/15/16 1100 03/15/16 1110  BP: 134/67 (!) 133/91  Pulse: 68 69  Resp: 12 14  Temp:      Last Pain:  Vitals:   03/15/16 0951  TempSrc: Tympanic                 VAN STAVEREN,Pang Robers

## 2016-03-15 NOTE — H&P (Signed)
   Primary Care Physician:  Sherrin Daisy, MD Primary Gastroenterologist:  Dr. Vira Agar  Pre-Procedure History & Physical: HPI:  Heidi Arias is a 82 y.o. female is here for an endoscopy.   Past Medical History:  Diagnosis Date  . Anxiety   . Cancer ([place])    melanoma  . Endometriosis   . GERD (gastroesophageal reflux disease)   . Recurrent UTI (urinary tract infection)   . Rheumatoid arthritis [facility])     Past Surgical History:  Procedure Laterality Date  . ABDOMINAL HYSTERECTOMY    . APPENDECTOMY    . CATARACT EXTRACTION    . CHOLECYSTECTOMY    . COLON SURGERY    . TONSILLECTOMY      Prior to Admission medications   Medication Sig Start Date End Date Taking? Authorizing Provider  Iron, Ferrous Gluconate, 256 (28 Fe) MG TABS Take 1 tablet by mouth daily. 01/12/16  Yes Merlyn Lot, MD  omeprazole (PRILOSEC) 40 MG capsule Take 1 capsule by mouth 2 (two) times daily. Take 30 minutes before breakfast and take 30 minutes before dinner. 01/19/16  Yes Historical Provider, MD  sertraline (ZOLOFT) 50 MG tablet  05/13/15  Yes Historical Provider, MD  cephALEXin (KEFLEX) 500 MG capsule Take 1 capsule (500 mg total) by mouth 3 (three) times daily. Patient not taking: Reported on 03/15/2016 02/05/16   Nance Pear, MD  omeprazole (PRILOSEC) 20 MG capsule Take 20 mg by mouth daily.  05/13/15   Historical Provider, MD  PREMARIN vaginal cream  06/14/15   Historical Provider, MD    Allergies as of 03/07/2016 - Review Complete 02/05/2016  Allergen Reaction Noted  . Codeine sulfate  06/17/2015  . Doxycycline hyclate Rash 06/17/2015  . Penicillin g Rash 06/17/2015    Family History  Problem Relation Age of Onset  . CVA Father   . Breast cancer Neg Hx     Social History   Social History  . Marital status: Widowed    Spouse name: N/A  . Number of children: N/A  . Years of education: N/A   Occupational History  . Not on file.   Social History Main Topics  .  Smoking status: Never Smoker  . Smokeless tobacco: Never Used  . Alcohol use No  . Drug use: No  . Sexual activity: No   Other Topics Concern  . Not on file   Social History Narrative  . No narrative on file    Review of Systems: See HPI, otherwise negative ROS  Physical Exam: BP 125/68   Pulse 68   Temp 97.9 F (36.6 C) (Tympanic)   Resp 18   Ht 5\' 2"  (1.575 m)   Wt 62.6 kg (138 lb)   SpO2 100%   BMI 25.24 kg/m  General:   Alert,  pleasant and cooperative in NAD Head:  Normocephalic and atraumatic. Neck:  Supple; no masses or thyromegaly. Lungs:  Clear throughout to auscultation.    Heart:  Regular rate and rhythm. Abdomen:  Soft, nontender and nondistended. Normal bowel sounds, without guarding, and without rebound.   Neurologic:  Alert and  oriented x4;  grossly normal neurologically.  Impression/Plan: Heidi Arias is here for an endoscopy to be performed for Iron def anemia.  Risks, benefits, limitations, and alternatives regarding  endoscopy have been reviewed with the patient.  Questions have been answered.  All parties agreeable.   Gaylyn Cheers, MD  03/15/2016, 10:19 AM

## 2016-03-15 NOTE — Anesthesia Preprocedure Evaluation (Signed)
Anesthesia Evaluation  Patient identified by MRN, date of birth, ID band Patient awake    Reviewed: Allergy & Precautions, NPO status , Patient's Chart, lab work & pertinent test results  Airway Mallampati: II       Dental  (+) Upper Dentures, Lower Dentures   Pulmonary neg pulmonary ROS,    breath sounds clear to auscultation       Cardiovascular Exercise Tolerance: Good  Rhythm:Regular     Neuro/Psych Anxiety negative neurological ROS     GI/Hepatic Neg liver ROS, GERD  Medicated,  Endo/Other  negative endocrine ROS  Renal/GU negative Renal ROS     Musculoskeletal   Abdominal Normal abdominal exam  (+)   Peds negative pediatric ROS (+)  Hematology  (+) anemia ,   Anesthesia Other Findings   Reproductive/Obstetrics                             Anesthesia Physical Anesthesia Plan  ASA: III  Anesthesia Plan: General   Post-op Pain Management:    Induction: Intravenous  Airway Management Planned: Natural Airway and Nasal Cannula  Additional Equipment:   Intra-op Plan:   Post-operative Plan:   Informed Consent: I have reviewed the patients History and Physical, chart, labs and discussed the procedure including the risks, benefits and alternatives for the proposed anesthesia with the patient or authorized representative who has indicated his/her understanding and acceptance.     Plan Discussed with: CRNA  Anesthesia Plan Comments:         Anesthesia Quick Evaluation

## 2016-03-15 NOTE — Op Note (Signed)
[facility] Gastroenterology Patient Name: Heidi Arias Procedure Date: 03/15/2016 10:22 AM MRN: [id number] Account #: [id number] Date of Birth: Oct 23, 1931 Admit Type: Outpatient Age: 82 Room: [facility] ENDO ROOM 4 Gender: Female Note Status: Finalized Procedure:            Upper GI endoscopy Indications:          Unexplained iron deficiency anemia Providers:            Manya Silvas, MD Referring MD:         Shirline Frees (Referring MD) Medicines:            Propofol per Anesthesia Complications:        No immediate complications. Procedure:            Pre-Anesthesia Assessment:                       - After reviewing the risks and benefits, the patient                        was deemed in satisfactory condition to undergo the                        procedure.                       After obtaining informed consent, the endoscope was                        passed under direct vision. Throughout the procedure,                        the patient's blood pressure, pulse, and oxygen                        saturations were monitored continuously. The                        Colonoscope was introduced through the mouth, and                        advanced to the second part of duodenum. The upper GI                        endoscopy was accomplished without difficulty. The                        patient tolerated the procedure well. Findings:      Mild rings were seen in the middle esophagus. A small- medium size       hiatal hernia was seen.      Diffuse and patchy mild inflammation characterized by erythema and       granularity was found in the gastric body and in the gastric antrum.       Biopsies were taken with a cold forceps for histology. Biopsies were       taken with a cold forceps for Helicobacter pylori testing.      The examined duodenum was normal. Impression:           - Non-obstructing and mild esophageal rings.                       -  Gastritis. Biopsied.                       - Normal examined duodenum. Recommendation:       - Await pathology results.                       - The findings and recommendations were discussed with                        the patient's family. Continue medications. Get blood                        count checked at primary doctor in a few weeks. Manya Silvas, MD 03/15/2016 10:41:07 AM This report has been signed electronically. Number of Addenda: 0 Note Initiated On: 03/15/2016 10:22 AM      [facility]

## 2016-03-16 ENCOUNTER — Encounter: Payer: Self-pay | Admitting: Unknown Physician Specialty

## 2016-03-17 LAB — SURGICAL PATHOLOGY

## 2016-03-21 DIAGNOSIS — D509 Iron deficiency anemia, unspecified: Secondary | ICD-10-CM | POA: Diagnosis not present

## 2016-03-21 DIAGNOSIS — R300 Dysuria: Secondary | ICD-10-CM | POA: Diagnosis not present

## 2016-04-27 ENCOUNTER — Other Ambulatory Visit: Payer: Self-pay | Admitting: *Deleted

## 2016-04-27 DIAGNOSIS — N390 Urinary tract infection, site not specified: Secondary | ICD-10-CM

## 2016-04-28 ENCOUNTER — Encounter: Payer: Self-pay | Admitting: Urology

## 2016-04-28 ENCOUNTER — Other Ambulatory Visit
Admission: RE | Admit: 2016-04-28 | Discharge: 2016-04-28 | Disposition: A | Discharge status: Home / Self Care | Payer: PPO | Source: Ambulatory Visit | Attending: Urology | Admitting: Urology

## 2016-04-28 ENCOUNTER — Ambulatory Visit (INDEPENDENT_AMBULATORY_CARE_PROVIDER_SITE_OTHER): Payer: PRIVATE HEALTH INSURANCE | Admitting: Urology

## 2016-04-28 VITALS — BP 132/67 | HR 73 | Ht 63.0 in | Wt 137.0 lb

## 2016-04-28 DIAGNOSIS — N390 Urinary tract infection, site not specified: Secondary | ICD-10-CM

## 2016-04-28 DIAGNOSIS — N952 Postmenopausal atrophic vaginitis: Secondary | ICD-10-CM | POA: Diagnosis not present

## 2016-04-28 LAB — URINALYSIS, COMPLETE (UACMP) WITH MICROSCOPIC
Bilirubin Urine: NEGATIVE
GLUCOSE, UA: NEGATIVE mg/dL
KETONES UR: NEGATIVE mg/dL
NITRITE: NEGATIVE
PH: 5.5 (ref 5.0–8.0)
PROTEIN: NEGATIVE mg/dL
Specific Gravity, Urine: 1.015 (ref 1.005–1.030)

## 2016-04-28 LAB — BLADDER SCAN AMB NON-IMAGING: Scan Result: 89

## 2016-04-28 MED ORDER — ESTROGENS, CONJUGATED 0.625 MG/GM VA CREA: 1.0000 | TOPICAL_CREAM | Freq: Every day | VAGINAL | 12 refills | Status: DC

## 2016-04-28 NOTE — Progress Notes (Signed)
04/28/2016 11:42 AM   Heidi Arias 07-Feb-1932 [id number]  Referring provider: Sherrin Daisy, MD [place], [place] [id number]  Chief Complaint  Patient presents with  . New Patient (Initial Visit)    recurrent uti referred by Dr. Kandice Arias    HPI: Patient is a 83 -year-old Caucasian female who is referred to [place] by, Dr. Kandice Arias, for recurrent urinary tract infections who presents with her daughter, Heidi Arias.    Patient states that she has had urinary tract infections since November 2017.  She has had three different rounds of antibiotics without relief.    Her symptoms with a urinary tract infection consist of mental status changes and suprapubic pain.  She denies dysuria, gross hematuria, back pain, abdominal pain and flank pain.  She has not had any recent fevers, chills, nausea or vomiting.  She does have a remote history of nephrolithiasis.   She denies GU surgery and GU trauma.   Reviewing her records,  she has had a positive culture was beta hemolytic streptococcus group, B in 01/2016 and enterobacter cloacae with variable resistance pattern in 02/2016.    She is not sexually active.  She is post menopausal.  She admits to diarrhea.  She does not engage in good perineal hygiene. She does not take tub baths.   She has incontinence.  She is using a whole lot of incontinence pads.  She is not having pain with bladder filling.    She has not had any recent imaging studies.    Her daughter is encouraging her to drink more water.  She drinks two cups of coffee daily.  She also drinks sweat tea.    Her PVR today is 89 mL.  Her UA today notes 6-30 WBC's.  She is using vaginal cream on a prn basis.     PMH: Past Medical History:  Diagnosis Date  . Anxiety   . Cancer ([place])    melanoma  . Endometriosis   . GERD (gastroesophageal reflux disease)   . History of stomach ulcers   . Kidney failure   . Recurrent UTI (urinary tract infection)   . Rheumatoid  arthritis [facility])     Surgical History: Past Surgical History:  Procedure Laterality Date  . ABDOMINAL HYSTERECTOMY    . APPENDECTOMY    . CATARACT EXTRACTION    . CHOLECYSTECTOMY    . COLON SURGERY    . ESOPHAGOGASTRODUODENOSCOPY (EGD) WITH PROPOFOL N/A 03/15/2016   Procedure: ESOPHAGOGASTRODUODENOSCOPY (EGD) WITH PROPOFOL;  Surgeon: Manya Silvas, MD;  Location: [facility] ENDOSCOPY;  Service: Endoscopy;  Laterality: N/A;  . [place]     wears hearing aide  . TONSILLECTOMY      Home Medications:  Allergies as of 04/28/2016      Reactions   Ciprofloxacin Hcl Rash   Codeine Sulfate    Other reaction(s): Unknown   Doxycycline Hyclate Rash   Penicillin G Rash   Sulfamethoxazole-trimethoprim Rash      Medication List       Accurate as of 04/28/16 11:42 AM. Always use your most recent med list.          cephALEXin 500 MG capsule Commonly known as:  KEFLEX Take 1 capsule (500 mg total) by mouth 3 (three) times daily.   Iron (Ferrous Gluconate) 256 (28 Fe) MG Tabs Take 1 tablet by mouth daily.   omeprazole 20 MG capsule Commonly known as:  PRILOSEC Take 20 mg by mouth daily.   omeprazole 40  MG capsule Commonly known as:  PRILOSEC Take 1 capsule by mouth 2 (two) times daily. Take 30 minutes before breakfast and take 30 minutes before dinner.   PREMARIN vaginal cream Generic drug:  conjugated estrogens   sertraline 50 MG tablet Commonly known as:  ZOLOFT   VITAMIN D3 PO Take by mouth.       Allergies:  Allergies  Allergen Reactions  . Ciprofloxacin Hcl Rash  . Codeine Sulfate     Other reaction(s): Unknown  . Doxycycline Hyclate Rash  . Penicillin G Rash  . Sulfamethoxazole-Trimethoprim Rash    Family History: Family History  Problem Relation Age of Onset  . CVA Father   . Breast cancer Neg Hx   . Prostate cancer Neg Hx   . Kidney cancer Neg Hx   . Kidney disease Neg Hx   . Bladder Cancer Neg Hx     Social History:  reports that she has  never smoked. She has never used smokeless tobacco. She reports that she does not drink alcohol or use drugs.  ROS: UROLOGY Frequent Urination?: Yes Hard to postpone urination?: Yes Burning/pain with urination?: No Get up at night to urinate?: Yes Leakage of urine?: Yes Urine stream starts and stops?: No Trouble starting stream?: No Do you have to strain to urinate?: No Blood in urine?: No Urinary tract infection?: Yes Sexually transmitted disease?: No Injury to kidneys or bladder?: No Painful intercourse?: No Weak stream?: No Currently pregnant?: No Vaginal bleeding?: No Last menstrual period?: n  Gastrointestinal Nausea?: No Vomiting?: No Indigestion/heartburn?: Yes Diarrhea?: Yes Constipation?: No  Constitutional Fever: No Night sweats?: No Weight loss?: No Fatigue?: No  Skin Skin rash/lesions?: No Itching?: No  Eyes Blurred vision?: No Double vision?: No  Ears/Nose/Throat Sore throat?: No Sinus problems?: No  Hematologic/Lymphatic Swollen glands?: No Easy bruising?: Yes  Cardiovascular Leg swelling?: No Chest pain?: No  Respiratory Cough?: No Shortness of breath?: No  Endocrine Excessive thirst?: No  Musculoskeletal Back pain?: Yes Joint pain?: Yes  Neurological Headaches?: No Dizziness?: No  Psychologic Depression?: Yes Anxiety?: Yes  Physical Exam: BP 132/67   Pulse 73   Ht 5\' 3"  (1.6 m)   Wt 137 lb (62.1 kg)   BMI 24.27 kg/m   Constitutional: Well nourished. Alert and oriented, No acute distress. HEENT:  AT, moist mucus membranes. Trachea midline, no masses. Cardiovascular: No clubbing, cyanosis, or edema. Respiratory: Normal respiratory effort, no increased work of breathing. GI: Abdomen is soft, non tender, non distended, no abdominal masses. Liver and spleen not palpable.  No hernias appreciated.  Stool sample for occult testing is not indicated.   GU: No CVA tenderness.  No bladder fullness or masses.  Atrophic  external genitalia, normal pubic hair distribution, no lesions.  Normal urethral meatus, no lesions, no prolapse, no discharge.   No urethral masses, tenderness and/or tenderness. No bladder fullness, tenderness or masses. Pale vagina mucosa, poor estrogen effect, no discharge, no lesions, good pelvic support, no cystocele or rectocele noted.  Cervix, uterus and left ovary are surgically absent.  Anus and perineum are without rashes or lesions.    Skin: No rashes, bruises or suspicious lesions. Lymph: No cervical or inguinal adenopathy. Neurologic: Grossly intact, no focal deficits, moving all 4 extremities. Psychiatric: Normal mood and affect.  Laboratory Data: Lab Results  Component Value Date   WBC 12.9 (H) 02/05/2016   HGB 11.3 (L) 02/05/2016   HCT 36.2 02/05/2016   MCV 73.8 (L) 02/05/2016   PLT 424 02/05/2016  Lab Results  Component Value Date   CREATININE 0.78 02/05/2016     Lab Results  Component Value Date   TSH 2.130 06/17/2015     Lab Results  Component Value Date   AST 17 01/12/2016   Lab Results  Component Value Date   ALT 12 (L) 01/12/2016     Urinalysis 6-30 WBC's.  See EPIC.    Assessment & Plan:    1. Recurrent UTI's  - Patient is instructed to increase her water intake until the urine is pale yellow or clear.  I have advised her to take probiotics (yogurt, oral pills or vaginal suppositories), take cranberry pills or drink the juice and use the estrogen cream.  She is to take Vitamin C 1,000 mg daily to acidify the urine.  She should also avoid soaking in tubs and wipe front to back after urinating.  She may benefit from core strengthening exercises.  We can refer her to PT if she desires.    - Because of her history of recurrent UTIs, I have asked the patient to contact our office if she should experience symptoms of urinary tract infection so that we can CATH her for an urine specimen for urinalysis and culture. This is to prevent a skin contaminant  from showing up in the urine culture.  If she should have her symptoms after hours or cannot get to our office, she should notify her other providers that she needs a catheterized specimen for UA and culture.   - I reviewed the symptoms of a urinary tract infection, such as a worsening of urinary urgency and frequency, dysuria, which is painful urination and not the pain of urine hitting sensitive perineal skin, hematuria, foul-smelling urine, suprapubic pain or mental status changes. Fevers, chills, nausea and or vomiting can also be signs of a possible UTI.  Positive urinalyses and positive urine cultures that are not associated with urinary symptoms should not be treated with antibiotics.    - I explained to the patient that being exposed to unnecessary antibiotics can put her at risk for increasing resistance of the bacteria to antibiotics, C. difficile and the side effects of the antibiotics.    2. Vaginal atrophy  - I explained to the patient that when women go through menopause and her estrogen levels are severely diminished, the normal vaginal flora will change.  This is due to an increase of the vaginal canal's pH. Because of this, the vaginal canal may be colonized by bacteria from the rectum instead of the protective lactobacillus.  This accompanied by the loss of the mucus barrier with vaginal atrophy is a cause of recurrent urinary tract infections.  - I also explained that in some studies, the use of vaginal estrogen cream has been demonstrated to reduce  recurrent urinary tract infections to one a year.    - Encouraged the patient that she needs to use the vaginal cream three nights weekly consistantly        Return in about 3 months (around 07/26/2016) for exam.  These notes generated with voice recognition software. I apologize for typographical errors.  [name], [facility] [address], [place], [place] [address] [phone]

## 2016-05-19 DIAGNOSIS — H7010 Chronic mastoiditis, unspecified ear: Secondary | ICD-10-CM | POA: Diagnosis not present

## 2016-05-19 DIAGNOSIS — H903 Sensorineural hearing loss, bilateral: Secondary | ICD-10-CM | POA: Diagnosis not present

## 2016-06-08 DIAGNOSIS — M545 Low back pain: Secondary | ICD-10-CM | POA: Diagnosis not present

## 2016-06-08 DIAGNOSIS — M25562 Pain in left knee: Secondary | ICD-10-CM | POA: Diagnosis not present

## 2016-06-08 DIAGNOSIS — D509 Iron deficiency anemia, unspecified: Secondary | ICD-10-CM | POA: Diagnosis not present

## 2016-06-21 ENCOUNTER — Encounter: Payer: PRIVATE HEALTH INSURANCE | Admitting: Obstetrics and Gynecology

## 2016-08-02 NOTE — Progress Notes (Signed)
08/04/2016 8:54 AM   Heidi Arias [place] 1932-03-10 [id number]  Referring provider: Sherrin Daisy, MD [place], [place] [address]  Chief Complaint  Patient presents with  . Vaginal Atrophy    3 month follow up  . Recurrent UTI    HPI: 83 yo WF who presents for a 3 month follow up for recurrent UTI's with her daughter, [name].    Background history Patient is a 89 -year-old Caucasian female who is referred to [place] by, Dr. Kandice Robinsons, for recurrent urinary tract infections who presents with her daughter, [name].  Patient states that she has had urinary tract infections since November 2017.  She has had three different rounds of antibiotics without relief.  Her symptoms with a urinary tract infection consist of mental status changes and suprapubic pain.  She denies dysuria, gross hematuria, back pain, abdominal pain and flank pain.  She has not had any recent fevers, chills, nausea or vomiting.  She does have a remote history of nephrolithiasis.   She denies GU surgery and GU trauma.  Reviewing her records,  she has had a positive culture was beta hemolytic streptococcus group, B in 01/2016 and enterobacter cloacae with variable resistance pattern in 02/2016.  She is not sexually active.  She is post menopausal.  She admits to diarrhea.  She does not engage in good perineal hygiene. She does not take tub baths.  She has incontinence.  She is using a whole lot of incontinence pads.  She is not having pain with bladder filling.  She has not had any recent imaging studies.  Her daughter is encouraging her to drink more water.  She drinks two cups of coffee daily.  She also drinks sweat tea.  Her PVR today is 89 mL.  Her UA notes 6-30 WBC's.  She is using vaginal cream on a prn basis.    At her last visit, we discussed UTI prevention strategies. We also increased her vaginal estrogen cream to 3 nights weekly. She has not had a urinary tract infection since her last visit with [place] 3  months ago.  Patient states that she has been having suprapubic pain in the morning for the last 3 weeks. She denies dysuria or gross hematuria.  She has not had fevers, chills, nausea or vomiting. She states that she has not noted a relationship of the pain to eating or bowel movements.  She is regular with her bowel movements.  UA today demonstrates 6-30 RBCs/hpf.    Today, The patient has been experiencing urgency x 0-3, frequency x 8, not is restricting fluids to avoid visits to the restroom, is engaging in toilet mapping, incontinence x 8 and nocturia x 0-3.   Her major complaints today are urinary frequency, nocturia and incontinence.    PMH: Past Medical History:  Diagnosis Date  . Anxiety   . Cancer ([place])    melanoma  . Endometriosis   . GERD (gastroesophageal reflux disease)   . History of stomach ulcers   . Kidney failure   . Recurrent UTI (urinary tract infection)   . Rheumatoid arthritis [facility])     Surgical History: Past Surgical History:  Procedure Laterality Date  . ABDOMINAL HYSTERECTOMY    . APPENDECTOMY    . CATARACT EXTRACTION    . CHOLECYSTECTOMY    . COLON SURGERY    . ESOPHAGOGASTRODUODENOSCOPY (EGD) WITH PROPOFOL N/A 03/15/2016   Procedure: ESOPHAGOGASTRODUODENOSCOPY (EGD) WITH PROPOFOL;  Surgeon: Manya Silvas, MD;  Location: [place];  Service: Endoscopy;  Laterality: N/A;  . [place]     wears hearing aide  . TONSILLECTOMY      Home Medications:  Allergies as of 08/04/2016      Reactions   Ciprofloxacin Hcl Rash   Codeine Sulfate    Other reaction(s): Unknown   Doxycycline Hyclate Rash   Penicillin G Rash   Sulfamethoxazole-trimethoprim Rash      Medication List       Accurate as of 08/04/16  8:54 AM. Always use your most recent med list.          cephALEXin 500 MG capsule Commonly known as:  KEFLEX Take 1 capsule (500 mg total) by mouth 3 (three) times daily.   Iron (Ferrous Gluconate) 256 (28 Fe) MG Tabs Take 1 tablet  by mouth daily.   omeprazole 20 MG capsule Commonly known as:  PRILOSEC Take 20 mg by mouth daily.   omeprazole 40 MG capsule Commonly known as:  PRILOSEC Take 1 capsule by mouth 2 (two) times daily. Take 30 minutes before breakfast and take 30 minutes before dinner.   PREMARIN vaginal cream Generic drug:  conjugated estrogens   conjugated estrogens vaginal cream Commonly known as:  PREMARIN Place 1 Applicatorful vaginally daily. Apply 0.5mg  (pea-sized amount)  just inside the vaginal introitus with a finger-tip every night for two weeks and then Monday, Wednesday and Friday nights.   sertraline 50 MG tablet Commonly known as:  ZOLOFT   VITAMIN D3 PO Take by mouth.       Allergies:  Allergies  Allergen Reactions  . Ciprofloxacin Hcl Rash  . Codeine Sulfate     Other reaction(s): Unknown  . Doxycycline Hyclate Rash  . Penicillin G Rash  . Sulfamethoxazole-Trimethoprim Rash    Family History: Family History  Problem Relation Age of Onset  . CVA Father   . Breast cancer Neg Hx   . Prostate cancer Neg Hx   . Kidney cancer Neg Hx   . Kidney disease Neg Hx   . Bladder Cancer Neg Hx     Social History:  reports that she has never smoked. She has never used smokeless tobacco. She reports that she does not drink alcohol or use drugs.  ROS: UROLOGY Frequent Urination?: Yes Hard to postpone urination?: No Burning/pain with urination?: No Get up at night to urinate?: Yes Leakage of urine?: Yes Urine stream starts and stops?: No Trouble starting stream?: No Do you have to strain to urinate?: No Blood in urine?: No Urinary tract infection?: Yes Sexually transmitted disease?: No Injury to kidneys or bladder?: No Painful intercourse?: No Weak stream?: No Currently pregnant?: No Vaginal bleeding?: No Last menstrual period?: n  Gastrointestinal Nausea?: No Vomiting?: No Indigestion/heartburn?: No Diarrhea?: No Constipation?: No  Constitutional Fever:  No Night sweats?: No Weight loss?: No Fatigue?: Yes  Skin Skin rash/lesions?: No Itching?: No  Eyes Blurred vision?: No Double vision?: No  Ears/Nose/Throat Sore throat?: No Sinus problems?: No  Hematologic/Lymphatic Swollen glands?: No Easy bruising?: No  Cardiovascular Leg swelling?: Yes Chest pain?: No  Respiratory Cough?: No Shortness of breath?: No  Endocrine Excessive thirst?: No  Musculoskeletal Back pain?: Yes Joint pain?: Yes  Neurological Headaches?: No Dizziness?: No  Psychologic Depression?: No Anxiety?: No  Physical Exam: BP 137/82   Pulse 79   Ht 5\' 2"  (1.575 m)   Wt 138 lb (62.6 kg)   BMI 25.24 kg/m   Constitutional: Well nourished. Alert and oriented, No acute distress. HEENT: [place] AT,  moist mucus membranes. Trachea midline, no masses. Cardiovascular: No clubbing, cyanosis, or edema. Respiratory: Normal respiratory effort, no increased work of breathing. GI: Abdomen is soft, non tender, non distended, no abdominal masses. Liver and spleen not palpable.  No hernias appreciated.  Stool sample for occult testing is not indicated.   GU: No CVA tenderness.  No bladder fullness or masses.  Atrophic external genitalia, normal pubic hair distribution, no lesions.  Normal urethral meatus, no lesions, no prolapse, no discharge.   No urethral masses, tenderness and/or tenderness. No bladder fullness, tenderness or masses. Pale vagina mucosa, poor estrogen effect, no discharge, no lesions, good pelvic support, Grade I cystocele is noted with leaking during Valsalva.  No rectocele noted.  Cervix, uterus and left ovary are surgically absent.  Anus and perineum are without rashes or lesions.    Skin: No rashes, bruises or suspicious lesions. Lymph: No cervical or inguinal adenopathy. Neurologic: Grossly intact, no focal deficits, moving all 4 extremities. Psychiatric: Normal mood and affect.  Laboratory Data: Lab Results  Component Value Date   WBC  12.9 (H) 02/05/2016   HGB 11.3 (L) 02/05/2016   HCT 36.2 02/05/2016   MCV 73.8 (L) 02/05/2016   PLT 424 02/05/2016    Lab Results  Component Value Date   CREATININE 0.78 02/05/2016     Lab Results  Component Value Date   TSH 2.130 06/17/2015     Lab Results  Component Value Date   AST 17 01/12/2016   Lab Results  Component Value Date   ALT 12 (L) 01/12/2016     Urinalysis 6-30 RBC's.  See EPIC.    Assessment & Plan:    1. Recurrent UTI's  - Reviewed UTI prevention strategies  - Asked the patient to contact us with symptoms of urinary tract infections so that we can manage her  - RTC in one year for OAB questionnaire  2. Vaginal atrophy  - Continue vaginal estrogen cream 3 nights weekly  - RTC in one year for exam  3. Suprapubic pain  - UA positive for 6-30 RBC's - will send for culture - if culture negative will need to pursue hematuria work up  4. Mixed urinary incontinence  - offered behavioral therapies, bladder training, bladder control strategies, pelvic floor muscle training - PT deferred  - fluid management - good intake  - offered medical therapy with anticholinergic therapy or beta-3 adrenergic receptor agonist and the potential side effects of each therapy - patient deferred  - offered refer to gynecology for a pessary fitting - patient deferred  - Patient will call if she decides to pursue one of these options  Return for pending UA results.  These notes generated with voice recognition software. I apologize for typographical errors.  [name], [facility] [address], [place], [place] [address] [phone]

## 2016-08-04 ENCOUNTER — Telehealth: Payer: Self-pay | Admitting: Urology

## 2016-08-04 ENCOUNTER — Encounter: Payer: Self-pay | Admitting: Urology

## 2016-08-04 ENCOUNTER — Ambulatory Visit (INDEPENDENT_AMBULATORY_CARE_PROVIDER_SITE_OTHER): Payer: PRIVATE HEALTH INSURANCE | Admitting: Urology

## 2016-08-04 ENCOUNTER — Other Ambulatory Visit
Admission: RE | Admit: 2016-08-04 | Discharge: 2016-08-04 | Disposition: A | Discharge status: Home / Self Care | Payer: PPO | Source: Ambulatory Visit | Attending: Urology | Admitting: Urology

## 2016-08-04 VITALS — BP 137/82 | HR 79 | Ht 62.0 in | Wt 138.0 lb

## 2016-08-04 DIAGNOSIS — N3946 Mixed incontinence: Secondary | ICD-10-CM | POA: Diagnosis not present

## 2016-08-04 DIAGNOSIS — N952 Postmenopausal atrophic vaginitis: Secondary | ICD-10-CM

## 2016-08-04 DIAGNOSIS — N390 Urinary tract infection, site not specified: Secondary | ICD-10-CM

## 2016-08-04 DIAGNOSIS — R102 Pelvic and perineal pain: Secondary | ICD-10-CM | POA: Insufficient documentation

## 2016-08-04 LAB — URINALYSIS, COMPLETE (UACMP) WITH MICROSCOPIC
BILIRUBIN URINE: NEGATIVE
GLUCOSE, UA: NEGATIVE mg/dL
Ketones, ur: NEGATIVE mg/dL
Leukocytes, UA: NEGATIVE
NITRITE: NEGATIVE
PH: 5.5 (ref 5.0–8.0)
Protein, ur: NEGATIVE mg/dL
SPECIFIC GRAVITY, URINE: 1.02 (ref 1.005–1.030)

## 2016-08-04 NOTE — Telephone Encounter (Signed)
Patient's UA is positive for hematuria. Please contact the patient concerning the results and let them know that we will be sending a urine for culture and will receive culture results on Monday.

## 2016-08-04 NOTE — Telephone Encounter (Signed)
Spoke with patient's daughter and gave message. Patient's daughter will be out of town next week but will have her cell with her. Ok with plan.

## 2016-08-05 LAB — URINE CULTURE

## 2016-08-07 ENCOUNTER — Telehealth: Payer: Self-pay | Admitting: Urology

## 2016-08-07 ENCOUNTER — Telehealth: Payer: Self-pay | Admitting: *Deleted

## 2016-08-07 DIAGNOSIS — R3129 Other microscopic hematuria: Secondary | ICD-10-CM

## 2016-08-07 NOTE — Telephone Encounter (Signed)
-----   Message from Nori Riis, PA-C sent at 08/05/2016  7:39 PM EDT ----- Please let the patient and her daughter know that the urine culture was negative.  She did have microscopic blood in her urine, so we need to pursue a hematuria workup.  She will need a CTU and cystoscopy.

## 2016-08-07 NOTE — Telephone Encounter (Signed)
done

## 2016-08-07 NOTE — Telephone Encounter (Signed)
Spoke with patient daughter, Let her know culture was negative but there is a need now for hematuria workup. Daughter ok with plan and awaiting call for CT appointment and follow up cystoscopy appointment. Referral made.

## 2016-08-24 ENCOUNTER — Other Ambulatory Visit: Payer: Self-pay

## 2016-08-24 DIAGNOSIS — R3129 Other microscopic hematuria: Secondary | ICD-10-CM

## 2016-08-25 ENCOUNTER — Ambulatory Visit
Admission: RE | Admit: 2016-08-25 | Discharge: 2016-08-25 | Disposition: A | Discharge status: Home / Self Care | Payer: PPO | Source: Ambulatory Visit | Attending: Urology | Admitting: Urology

## 2016-08-25 ENCOUNTER — Other Ambulatory Visit
Admission: RE | Admit: 2016-08-25 | Discharge: 2016-08-25 | Disposition: A | Discharge status: Home / Self Care | Payer: PPO | Source: Ambulatory Visit | Attending: Urology | Admitting: Urology

## 2016-08-25 DIAGNOSIS — R3129 Other microscopic hematuria: Secondary | ICD-10-CM | POA: Insufficient documentation

## 2016-08-25 DIAGNOSIS — I700 Atherosclerosis of aorta: Secondary | ICD-10-CM | POA: Diagnosis not present

## 2016-08-25 DIAGNOSIS — K862 Cyst of pancreas: Secondary | ICD-10-CM | POA: Insufficient documentation

## 2016-08-25 DIAGNOSIS — C44629 Squamous cell carcinoma of skin of left upper limb, including shoulder: Secondary | ICD-10-CM | POA: Diagnosis not present

## 2016-08-25 DIAGNOSIS — D485 Neoplasm of uncertain behavior of skin: Secondary | ICD-10-CM | POA: Diagnosis not present

## 2016-08-25 DIAGNOSIS — K5730 Diverticulosis of large intestine without perforation or abscess without bleeding: Secondary | ICD-10-CM | POA: Insufficient documentation

## 2016-08-25 LAB — CREATININE, SERUM
CREATININE: 0.92 mg/dL (ref 0.44–1.00)
GFR calc non Af Amer: 56 mL/min — ABNORMAL LOW (ref >60)

## 2016-08-25 LAB — BUN: BUN: 16 mg/dL (ref 6–20)

## 2016-08-25 MED ORDER — IOPAMIDOL (ISOVUE-300) INJECTION 61%
125.0000 mL | Freq: Once | INTRAVENOUS | Status: AC | PRN
  Administered 2016-08-25: 125 mL via INTRAVENOUS

## 2016-08-31 ENCOUNTER — Other Ambulatory Visit: Payer: Self-pay

## 2016-08-31 DIAGNOSIS — R3129 Other microscopic hematuria: Secondary | ICD-10-CM

## 2016-09-01 ENCOUNTER — Encounter: Payer: Self-pay | Admitting: Urology

## 2016-09-01 ENCOUNTER — Ambulatory Visit (INDEPENDENT_AMBULATORY_CARE_PROVIDER_SITE_OTHER): Payer: PPO | Admitting: Urology

## 2016-09-01 ENCOUNTER — Other Ambulatory Visit
Admission: RE | Admit: 2016-09-01 | Discharge: 2016-09-01 | Disposition: A | Discharge status: Home / Self Care | Payer: PPO | Source: Ambulatory Visit | Attending: Urology | Admitting: Urology

## 2016-09-01 VITALS — BP 141/69 | HR 70 | Ht 62.0 in | Wt 135.0 lb

## 2016-09-01 DIAGNOSIS — R3129 Other microscopic hematuria: Secondary | ICD-10-CM | POA: Insufficient documentation

## 2016-09-01 DIAGNOSIS — N390 Urinary tract infection, site not specified: Secondary | ICD-10-CM

## 2016-09-01 DIAGNOSIS — N952 Postmenopausal atrophic vaginitis: Secondary | ICD-10-CM

## 2016-09-01 LAB — URINALYSIS, COMPLETE (UACMP) WITH MICROSCOPIC
Bilirubin Urine: NEGATIVE
Glucose, UA: NEGATIVE mg/dL
KETONES UR: NEGATIVE mg/dL
LEUKOCYTES UA: NEGATIVE
Nitrite: NEGATIVE
PROTEIN: NEGATIVE mg/dL
Specific Gravity, Urine: >1.03 — ABNORMAL HIGH (ref 1.005–1.030)
pH: 5.5 (ref 5.0–8.0)

## 2016-09-01 MED ORDER — LIDOCAINE HCL 2 % EX GEL
1.0000 "application " | Freq: Once | CUTANEOUS | Status: AC
  Administered 2016-09-01: 1 via URETHRAL

## 2016-09-01 MED ORDER — GENTAMICIN SULFATE 40 MG/ML IJ SOLN
80.0000 mg | Freq: Once | INTRAMUSCULAR | Status: AC
  Administered 2016-09-01: 80 mg via INTRAMUSCULAR

## 2016-09-01 NOTE — Progress Notes (Signed)
   09/01/16  CC:  Chief Complaint  Patient presents with  . Cysto    HPI: 83 year old female with a history of recurrent urinary tract infections and microscopic hematuria.  She underwent CT urogram on 08/25/2016 which shows no GU pathology. This was reviewed personally today. She does have a 1 cm pancreatic head cyst which is stable since 2014 and per radiology requires no further workup.  Blood pressure (!) 141/69, pulse 70, height 5\' 2"  (1.575 m), weight 135 lb (61.2 kg). NED. A&Ox3.   No respiratory distress   Abd soft, NT, ND Normal external genitalia with patent urethral meatus  Cystoscopy Procedure Note  Patient identification was confirmed, informed consent was obtained, and patient was prepped using Betadine solution.  Lidocaine jelly was administered per urethral meatus.    Preoperative abx where received prior to procedure.    Procedure: - Flexible cystoscope introduced, without any difficulty.   - Thorough search of the bladder revealed:    normal urethral meatus    normal urothelium other than as below    no stones    no ulcers     no obvious tumors tumors     no urethral polyps    Mild trabeculation with a large wide mouth diverticulum involving the majority of the right lateral wall  - Ureteral orifices were normal in position and appearance with trigonitis extending between the UO and extending beyond left UO (slightly rased bullous appearing lesions, multiple)  Post-Procedure: - Patient tolerated the procedure well  Assessment/ Plan:  1. Microscopic hematuria CT urogram unremarkable Cystoscopy today with some changes around the trigone which I suspect her inflammatory based on her history, we'll send urine cytology for completeness Options including more aggressively pursuing biopsy the operating room versus repeat cystoscopy to ensure stability were discussed She and her daughter or motion interested in repeating cystoscopy in 3 months, if stable no  further workup needed pending urine cytology - gentamicin (GARAMYCIN) injection 80 mg; Inject 2 mLs (80 mg total) into the muscle once. - lidocaine (XYLOCAINE) 2 % jelly 1 application; Place 1 application into the urethra once.  2. Recurrent UTI Continue estrace cream  3. Vaginal atrophy Same as #2   Return in about 3 months (around 12/02/2016) for cysto.  Hollice Espy, MD

## 2016-09-07 ENCOUNTER — Other Ambulatory Visit: Payer: Self-pay | Admitting: Urology

## 2016-09-08 ENCOUNTER — Telehealth: Payer: Self-pay | Admitting: *Deleted

## 2016-09-08 DIAGNOSIS — N183 Chronic kidney disease, stage 3 (moderate): Secondary | ICD-10-CM | POA: Diagnosis not present

## 2016-09-08 DIAGNOSIS — M25562 Pain in left knee: Secondary | ICD-10-CM | POA: Diagnosis not present

## 2016-09-08 DIAGNOSIS — E538 Deficiency of other specified B group vitamins: Secondary | ICD-10-CM | POA: Diagnosis not present

## 2016-09-08 DIAGNOSIS — F419 Anxiety disorder, unspecified: Secondary | ICD-10-CM | POA: Diagnosis not present

## 2016-09-08 DIAGNOSIS — R7302 Impaired glucose tolerance (oral): Secondary | ICD-10-CM | POA: Diagnosis not present

## 2016-09-08 DIAGNOSIS — M1712 Unilateral primary osteoarthritis, left knee: Secondary | ICD-10-CM | POA: Diagnosis not present

## 2016-09-08 DIAGNOSIS — G8929 Other chronic pain: Secondary | ICD-10-CM | POA: Diagnosis not present

## 2016-09-08 DIAGNOSIS — Z79899 Other long term (current) drug therapy: Secondary | ICD-10-CM | POA: Diagnosis not present

## 2016-09-08 DIAGNOSIS — D509 Iron deficiency anemia, unspecified: Secondary | ICD-10-CM | POA: Diagnosis not present

## 2016-09-08 NOTE — Telephone Encounter (Signed)
Tried to call patient mailbox is full can not leave message.

## 2016-09-08 NOTE — Telephone Encounter (Signed)
-----   Message from Hollice Espy, MD sent at 09/07/2016  6:39 PM EDT ----- Please let her know urine cytology negative which is reassuring.  Good new.  Hollice Espy, MD

## 2016-09-11 NOTE — Telephone Encounter (Signed)
Tried to call patient mailbox full still and can not leave message.

## 2016-09-12 NOTE — Telephone Encounter (Signed)
Tried to call patient mailbox full can not leave message. Letter sent.

## 2016-09-20 DIAGNOSIS — C44629 Squamous cell carcinoma of skin of left upper limb, including shoulder: Secondary | ICD-10-CM | POA: Diagnosis not present

## 2016-10-05 DIAGNOSIS — M25561 Pain in right knee: Secondary | ICD-10-CM | POA: Diagnosis not present

## 2016-10-05 DIAGNOSIS — G8929 Other chronic pain: Secondary | ICD-10-CM | POA: Diagnosis not present

## 2016-10-05 DIAGNOSIS — M25562 Pain in left knee: Secondary | ICD-10-CM | POA: Diagnosis not present

## 2016-10-06 ENCOUNTER — Other Ambulatory Visit: Payer: Self-pay | Admitting: Nurse Practitioner

## 2016-10-06 DIAGNOSIS — Z1231 Encounter for screening mammogram for malignant neoplasm of breast: Secondary | ICD-10-CM

## 2016-11-16 ENCOUNTER — Ambulatory Visit
Admission: RE | Admit: 2016-11-16 | Discharge: 2016-11-16 | Disposition: A | Discharge status: Home / Self Care | Payer: PPO | Source: Ambulatory Visit | Attending: Nurse Practitioner | Admitting: Nurse Practitioner

## 2016-11-16 DIAGNOSIS — Z1231 Encounter for screening mammogram for malignant neoplasm of breast: Secondary | ICD-10-CM | POA: Diagnosis not present

## 2016-11-24 DIAGNOSIS — H7011 Chronic mastoiditis, right ear: Secondary | ICD-10-CM | POA: Diagnosis not present

## 2016-11-24 DIAGNOSIS — H903 Sensorineural hearing loss, bilateral: Secondary | ICD-10-CM | POA: Diagnosis not present

## 2016-11-30 ENCOUNTER — Ambulatory Visit: Payer: PPO

## 2016-11-30 ENCOUNTER — Other Ambulatory Visit: Payer: Self-pay | Admitting: *Deleted

## 2016-11-30 DIAGNOSIS — R3129 Other microscopic hematuria: Secondary | ICD-10-CM

## 2016-12-01 ENCOUNTER — Other Ambulatory Visit
Admission: RE | Admit: 2016-12-01 | Discharge: 2016-12-01 | Disposition: A | Discharge status: Home / Self Care | Payer: PPO | Source: Ambulatory Visit | Attending: Urology | Admitting: Urology

## 2016-12-01 ENCOUNTER — Encounter: Payer: Self-pay | Admitting: Urology

## 2016-12-01 ENCOUNTER — Ambulatory Visit (INDEPENDENT_AMBULATORY_CARE_PROVIDER_SITE_OTHER): Payer: PPO | Admitting: Urology

## 2016-12-01 VITALS — BP 131/67 | HR 67 | Ht 63.0 in | Wt 134.0 lb

## 2016-12-01 DIAGNOSIS — R300 Dysuria: Secondary | ICD-10-CM | POA: Diagnosis not present

## 2016-12-01 DIAGNOSIS — N329 Bladder disorder, unspecified: Secondary | ICD-10-CM

## 2016-12-01 DIAGNOSIS — N3000 Acute cystitis without hematuria: Secondary | ICD-10-CM | POA: Diagnosis not present

## 2016-12-01 DIAGNOSIS — R3129 Other microscopic hematuria: Secondary | ICD-10-CM | POA: Insufficient documentation

## 2016-12-01 LAB — URINALYSIS, COMPLETE (UACMP) WITH MICROSCOPIC
BILIRUBIN URINE: NEGATIVE
Glucose, UA: NEGATIVE mg/dL
KETONES UR: NEGATIVE mg/dL
NITRITE: POSITIVE — AB
PH: 5.5 (ref 5.0–8.0)
Protein, ur: NEGATIVE mg/dL
SPECIFIC GRAVITY, URINE: 1.025 (ref 1.005–1.030)

## 2016-12-01 MED ORDER — CEPHALEXIN 500 MG PO CAPS: 500.0000 mg | ORAL_CAPSULE | Freq: Three times a day (TID) | ORAL | 0 refills | Status: DC

## 2016-12-01 NOTE — Progress Notes (Signed)
   12/01/16  CC:  Chief Complaint  Patient presents with  . Cysto    HPI: 83 year old female who presents today for repeat cystoscopy to follow up cystoscopic changes involving the trigone and be on the left ureteral orifice (slightly raised bolus appearance).  Urine cytology was negative. Unfortunately today, her UA is frankly positive for UTI. She reports that over the past several days, she's had some lower abdominal discomfort which she generally experiences with her infections. No fevers or chills. Frequency and incontinence is at its baseline.  Blood pressure 131/67, pulse 67, height 5\' 3"  (1.6 m), weight 134 lb (60.8 kg). NED. A&Ox3.  Hard of hearing. Accompanied by daughter. No respiratory distress   Abd soft, NT, ND No CVA tenderness. Ambulating with cane.  Results for orders placed or performed during the hospital encounter of 12/01/16  Urinalysis, Complete w Microscopic  Result Value Ref Range   Color, Urine YELLOW YELLOW   APPearance HAZY (A) CLEAR   Specific Gravity, Urine 1.025 1.005 - 1.030   pH 5.5 5.0 - 8.0   Glucose, UA NEGATIVE NEGATIVE mg/dL   Hgb urine dipstick SMALL (A) NEGATIVE   Bilirubin Urine NEGATIVE NEGATIVE   Ketones, ur NEGATIVE NEGATIVE mg/dL   Protein, ur NEGATIVE NEGATIVE mg/dL   Nitrite POSITIVE (A) NEGATIVE   Leukocytes, UA MODERATE (A) NEGATIVE   Squamous Epithelial / LPF 0-5 (A) NONE SEEN   WBC, UA TOO NUMEROUS TO COUNT 0 - 5 WBC/hpf   RBC / HPF 0-5 0 - 5 RBC/hpf   Bacteria, UA MANY (A) NONE SEEN    Assessment/ Plan:  1. Acute cystitis without hematuria UA suspicious for infection today, will go ahead and 2 with Keflex 500 mg 3 times a day which she is tolerated previously despite her penicillin allergy Follow-up urine culture and adjust as needed - Urine Culture; Future  2. Lesion of bladder We'll defer cystoscopy today as this may contribute to abnormal cystoscopic findings, reschedule once urine culture has been adequately  treated Previously negative urine cytology reassuring   Return in about 2 weeks (around 12/15/2016) for reschedule cysto.   Hollice Espy, MD

## 2016-12-04 ENCOUNTER — Telehealth: Payer: Self-pay

## 2016-12-04 LAB — URINE CULTURE

## 2016-12-04 NOTE — Telephone Encounter (Signed)
-----   Message from Hollice Espy, MD sent at 12/04/2016  2:12 PM EDT ----- Please call [facility] and see why sensitivities not done....  Hollice Espy, MD

## 2016-12-04 NOTE — Telephone Encounter (Signed)
Patient's daughter notified culture was positive and on correct abx per Dr. Erlene Quan

## 2016-12-21 ENCOUNTER — Other Ambulatory Visit: Payer: Self-pay

## 2016-12-21 DIAGNOSIS — R3129 Other microscopic hematuria: Secondary | ICD-10-CM

## 2016-12-22 ENCOUNTER — Other Ambulatory Visit
Admission: RE | Admit: 2016-12-22 | Discharge: 2016-12-22 | Disposition: A | Discharge status: Home / Self Care | Payer: PPO | Source: Ambulatory Visit | Attending: Urology | Admitting: Urology

## 2016-12-22 ENCOUNTER — Encounter: Payer: Self-pay | Admitting: Urology

## 2016-12-22 ENCOUNTER — Ambulatory Visit (INDEPENDENT_AMBULATORY_CARE_PROVIDER_SITE_OTHER): Payer: PPO | Admitting: Urology

## 2016-12-22 VITALS — BP 147/71 | HR 68 | Ht 63.0 in | Wt 136.0 lb

## 2016-12-22 DIAGNOSIS — N390 Urinary tract infection, site not specified: Secondary | ICD-10-CM

## 2016-12-22 DIAGNOSIS — R3129 Other microscopic hematuria: Secondary | ICD-10-CM | POA: Diagnosis not present

## 2016-12-22 DIAGNOSIS — N329 Bladder disorder, unspecified: Secondary | ICD-10-CM | POA: Diagnosis not present

## 2016-12-22 LAB — URINALYSIS, COMPLETE (UACMP) WITH MICROSCOPIC
Bilirubin Urine: NEGATIVE
GLUCOSE, UA: NEGATIVE mg/dL
Ketones, ur: NEGATIVE mg/dL
Leukocytes, UA: NEGATIVE
NITRITE: POSITIVE — AB
Protein, ur: NEGATIVE mg/dL
SPECIFIC GRAVITY, URINE: 1.02 (ref 1.005–1.030)
pH: 5.5 (ref 5.0–8.0)

## 2016-12-22 MED ORDER — TRIMETHOPRIM 100 MG PO TABS: 100.0000 mg | ORAL_TABLET | Freq: Every day | ORAL | 0 refills | Status: DC

## 2016-12-22 MED ORDER — GENTAMICIN SULFATE 40 MG/ML IJ SOLN
80.0000 mg | Freq: Once | INTRAMUSCULAR | Status: AC
  Administered 2016-12-22: 80 mg

## 2016-12-22 MED ORDER — CEPHALEXIN 500 MG PO CAPS: 500.0000 mg | ORAL_CAPSULE | Freq: Three times a day (TID) | ORAL | 0 refills | Status: DC

## 2016-12-22 MED ORDER — LIDOCAINE HCL 2 % EX GEL
1.0000 "application " | Freq: Once | CUTANEOUS | Status: AC
  Administered 2016-12-22: 1 via URETHRAL

## 2016-12-22 NOTE — Progress Notes (Signed)
IM Injection  Patient is present today for an IM Injection before cystoscopy because of allergy restraints. Drug: Gentamycin Dose:80 mg Location:Right Buttock Lot: [id number] Exp: 12/18 Patient tolerated well.  Preformed by: Lyndee Hensen CMA

## 2016-12-22 NOTE — Progress Notes (Signed)
12/22/16  CC:  Chief Complaint  Patient presents with  . Cysto    HPI: 83 year old female who presents today for repeat cystoscopy to follow up cystoscopic changes involving the trigone and be on the left ureteral orifice (slightly raised bolus appearance).  Urine cytology was negative. She returned on 12/01/2016 at which time her UA was frankly positive. She ended up growing out 100,000 colonies of Klebsiella pneumonia treated with Keflex which is well tolerated.  Her UA frankly positive for UTI.  Frequency and incontinence is at its baseline.  No fevers or chills.  No AMS per daughter.    After discussing risks and benefits on whether or not to proceed, she did elect for cystoscopy today. She was given IM gentamicin as a precaution.   Blood pressure 131/67, pulse 67, height 5\' 3"  (1.6 m), weight 134 lb (60.8 kg). NED. A&Ox3.  Hard of hearing. Accompanied by daughter. No respiratory distress   Abd soft, NT, ND No CVA tenderness. Ambulating with cane.  Results for orders placed or performed during the hospital encounter of 12/22/16  Urinalysis, Complete w Microscopic  Result Value Ref Range   Color, Urine YELLOW YELLOW   APPearance HAZY (A) CLEAR   Specific Gravity, Urine 1.020 1.005 - 1.030   pH 5.5 5.0 - 8.0   Glucose, UA NEGATIVE NEGATIVE mg/dL   Hgb urine dipstick SMALL (A) NEGATIVE   Bilirubin Urine NEGATIVE NEGATIVE   Ketones, ur NEGATIVE NEGATIVE mg/dL   Protein, ur NEGATIVE NEGATIVE mg/dL   Nitrite POSITIVE (A) NEGATIVE   Leukocytes, UA NEGATIVE NEGATIVE   Squamous Epithelial / LPF 0-5 (A) NONE SEEN   WBC, UA 6-30 0 - 5 WBC/hpf   RBC / HPF 0-5 0 - 5 RBC/hpf   Bacteria, UA MANY (A) NONE SEEN   Amorphous Crystal PRESENT     Cystoscopy Procedure Note  Patient identification was confirmed, informed consent was obtained, and patient was prepped using Betadine solution.  Lidocaine jelly was administered per urethral meatus.    Preoperative abx where received  prior to procedure.    Procedure: - Flexible cystoscope introduced, without any difficulty.   - Thorough search of the bladder revealed:    normal urethral meatus    normal urothelium other than as below    no stones    no ulcers     no obvious tumors tumors     no urethral polyps    Mild trabeculation with a large wide mouth diverticulum involving the majority of the right lateral wall  - Ureteral orifices were normal in position and appearance with trigonitis extending between the UOs.  No further extension of edema or change around the left ureteral orifice as previously appreciated, regression noted.  Post-Procedure: - Patient tolerated the procedure well  Assessment/ Plan:  1. Acute cystitis without hematuria Elected to proceed with procedure today given its been deferred no several occasions in the setting of UTI UA suspicious again today infection today, will go ahead and 2 with Keflex 500 mg 3 times a day which she is tolerated previously despite her penicillin allergy then continue with daily trimethoprim for 3 months for suppression Follow-up urine culture and adjust as needed IM gent given today Waring symptoms and indication for more urgent follow-up were reviewed in detail including fevers, chills, altered mental status, amongst others. - Urine Culture; Future  2. Lesion of bladder Cystoscopy today reassuring, appears to be chronic inflammatory condition of the trigone with regression of the lesion around the  ureteral orifice Likely related to recurrent urinary tract infections, pseudomembranous change Urine cytology negative, reassuring   Return in about 3 months (around 03/23/2017) for [facility] for UA/ UCx, symptoms recheck.   Hollice Espy, MD

## 2016-12-25 ENCOUNTER — Telehealth: Payer: Self-pay | Admitting: Urology

## 2016-12-25 LAB — URINE CULTURE

## 2016-12-25 NOTE — Telephone Encounter (Signed)
This patient grew Pseudomonas in her urine  which is highly resistent to antibiotics. She also has multiple drug allergies including to Cipro.  I would like her to have a repeat UA/ UCx via catheterized specimen to ensure this UTI is real prior to committing her to IV abx.    If UTI is real, she will need to referred ID for treatment of her  UTI.  Please contact with plan (her daughter).  Hollice Espy, MD

## 2016-12-25 NOTE — Telephone Encounter (Signed)
Called daughter. No answer. Left vmail.

## 2016-12-25 NOTE — Telephone Encounter (Signed)
Spoke to daughter. Gave results and instructions per Dr. Cherrie Gauze previous note. Dghtr will bring pt in for cath ua/cx on tomorrow morning. Scheduled nurse visit for tomorrow.  Dghtr verbalized understanding.

## 2016-12-26 ENCOUNTER — Ambulatory Visit (INDEPENDENT_AMBULATORY_CARE_PROVIDER_SITE_OTHER): Payer: PPO

## 2016-12-26 VITALS — BP 128/68 | HR 76 | Ht 62.0 in | Wt 135.4 lb

## 2016-12-26 DIAGNOSIS — N390 Urinary tract infection, site not specified: Secondary | ICD-10-CM

## 2016-12-26 LAB — URINALYSIS, COMPLETE
Bilirubin, UA: NEGATIVE
Glucose, UA: NEGATIVE
Ketones, UA: NEGATIVE
LEUKOCYTES UA: NEGATIVE
Nitrite, UA: POSITIVE — AB
PH UA: 6.5 (ref 5.0–7.5)
Protein, UA: NEGATIVE
Specific Gravity, UA: 1.01 (ref 1.005–1.030)
Urobilinogen, Ur: 0.2 mg/dL (ref 0.2–1.0)

## 2016-12-26 LAB — MICROSCOPIC EXAMINATION
EPITHELIAL CELLS (NON RENAL): NONE SEEN /HPF (ref 0–10)
RBC, UA: NONE SEEN /hpf (ref 0–?)

## 2016-12-26 NOTE — Progress Notes (Signed)
Pt presents today with c/o urinary frequency and urgency, hard to postpone urination, dysuria, leakage of urine, back/flank pain, and lower abd pain. Pt is on abx currently. A CATH specimen was obtained for u/a and cx.  Blood pressure 128/68, pulse 76, height 5\' 2"  (1.575 m), weight 135 lb 6.4 oz (61.4 kg).

## 2016-12-29 ENCOUNTER — Telehealth: Payer: Self-pay

## 2016-12-29 DIAGNOSIS — N390 Urinary tract infection, site not specified: Secondary | ICD-10-CM

## 2016-12-29 LAB — CULTURE, URINE COMPREHENSIVE

## 2016-12-29 MED ORDER — CIPROFLOXACIN HCL 500 MG PO TABS: 500.0000 mg | ORAL_TABLET | Freq: Two times a day (BID) | ORAL | 0 refills | Status: DC

## 2016-12-29 NOTE — Telephone Encounter (Signed)
-----   Message from Hollice Espy, MD sent at 12/29/2016  4:00 PM EDT ----- Urine culture does show pseudomonas. Unfortunately, the only medication that would work orally would be Cipro. She has an allergy in the form of a rash based on documentation. Either we can try Cipro 500 mg po bid x 7day with the addition of Benadryl to treat this infection, otherwise she needs referral to infectious disease for IV antibiotics.

## 2016-12-29 NOTE — Telephone Encounter (Signed)
Spoke with pt daughter in reference to +ucx and cipro. Daughter stated that pt will try cipro and benadryl. Daughter will call back Monday if pt is unable to take medication.

## 2016-12-29 NOTE — Telephone Encounter (Signed)
LMOM

## 2017-01-01 ENCOUNTER — Telehealth: Payer: Self-pay

## 2017-01-01 NOTE — Telephone Encounter (Signed)
Pt daughter called stating pt has been taking abx with benadryl and shes been able to tolerate the abx. Daughter did inquire about needing a cath specimen post abx to ensure infection is truly cleared. Please advise.

## 2017-01-01 NOTE — Telephone Encounter (Signed)
Yes, lets repeat culture in 10 days after completion of abx.  Hollice Espy, MD

## 2017-01-01 NOTE — Telephone Encounter (Signed)
Spoke with pt daughter in reference to checking pt urine again post abx therapy. Daughter voiced understanding. Nurse visit made.

## 2017-01-08 ENCOUNTER — Telehealth: Payer: Self-pay | Admitting: Urology

## 2017-01-16 ENCOUNTER — Ambulatory Visit: Payer: PPO

## 2017-01-17 ENCOUNTER — Ambulatory Visit (INDEPENDENT_AMBULATORY_CARE_PROVIDER_SITE_OTHER): Payer: PPO

## 2017-01-17 VITALS — BP 144/82 | HR 75 | Ht 62.0 in | Wt 134.1 lb

## 2017-01-17 DIAGNOSIS — N390 Urinary tract infection, site not specified: Secondary | ICD-10-CM

## 2017-01-17 NOTE — Progress Notes (Signed)
Pt presents today for a post abx therapy cath specimen. Pt still c/o urinary frequency and urgency, dysuria, leakage of urine, and back pain. Urine was sent for u/a and cx.  Blood pressure (!) 144/82, pulse 75, height 5\' 2"  (1.575 m), weight 134 lb 1.6 oz (60.8 kg).

## 2017-01-19 LAB — URINALYSIS, COMPLETE
BILIRUBIN UA: NEGATIVE
Glucose, UA: NEGATIVE
KETONES UA: NEGATIVE
LEUKOCYTES UA: NEGATIVE
Nitrite, UA: NEGATIVE
PH UA: 6 (ref 5.0–7.5)
PROTEIN UA: NEGATIVE
SPEC GRAV UA: 1.015 (ref 1.005–1.030)
UUROB: 0.2 mg/dL (ref 0.2–1.0)

## 2017-01-19 LAB — MICROSCOPIC EXAMINATION
BACTERIA UA: NONE SEEN
RBC, UA: NONE SEEN /hpf (ref 0–?)

## 2017-01-21 LAB — CULTURE, URINE COMPREHENSIVE

## 2017-03-09 DIAGNOSIS — N183 Chronic kidney disease, stage 3 (moderate): Secondary | ICD-10-CM | POA: Diagnosis not present

## 2017-03-09 DIAGNOSIS — Z79899 Other long term (current) drug therapy: Secondary | ICD-10-CM | POA: Diagnosis not present

## 2017-03-09 DIAGNOSIS — D509 Iron deficiency anemia, unspecified: Secondary | ICD-10-CM | POA: Diagnosis not present

## 2017-03-09 DIAGNOSIS — R238 Other skin changes: Secondary | ICD-10-CM | POA: Diagnosis not present

## 2017-03-09 DIAGNOSIS — K219 Gastro-esophageal reflux disease without esophagitis: Secondary | ICD-10-CM | POA: Diagnosis not present

## 2017-03-09 DIAGNOSIS — F419 Anxiety disorder, unspecified: Secondary | ICD-10-CM | POA: Diagnosis not present

## 2017-03-09 DIAGNOSIS — J00 Acute nasopharyngitis [common cold]: Secondary | ICD-10-CM | POA: Diagnosis not present

## 2017-03-29 NOTE — Progress Notes (Signed)
03/30/2017 8:44 AM   Heidi Arias [place] Jun 11, 1931 [id number]  Referring provider: Sallee Lange, NP [address] [place],  [address]      Chief Complaint  Patient presents with  . Recurrent UTI    56month    HPI: 84 yo WF with a history of hematuria, vaginal atrophy, recurrent UTI's and mixed incontinence who presents for a three month follow up.  History of hematuria CTU, cystoscopy and urine cytology did not result in worrisome findings.  She does not report any gross hematuria.  Her UA today is negative for hematuria today.    Vaginal atrophy She is applying the vaginal estrogen cream three nights weekly.  She is not experiencing vaginal burning or discharge.  Recurrent UTI's Patient's last positive urine culture was in 12/2016.  She is not having symptoms of an UTI at this time.  She is currently on suppressive therapy with trimethoprim.   She is experiencing suprapubic pressure.  Her UA today is positive for nitrite and many bacteria.  This is a CATH UA.  Mixed incontinence The patient has been experiencing urgency x 4-7, frequency x 8 or more, not restricting fluids to avoid visits to the restroom, is engaging in toilet mapping, incontinence x 4-7 and nocturia x 0-3.  Her main complaints today are frequency, urgency, nocturia and incontinence.    PMH: Past Medical History:  Diagnosis Date  . Anxiety   . Cancer ([place])    melanoma  . Cancer ([facility])    squamous cell  . Endometriosis   . GERD (gastroesophageal reflux disease)   . History of stomach ulcers   . Kidney failure   . Recurrent UTI (urinary tract infection)   . Rheumatoid arthritis [facility])     Surgical History: Past Surgical History:  Procedure Laterality Date  . ABDOMINAL HYSTERECTOMY    . APPENDECTOMY    . CATARACT EXTRACTION    . CHOLECYSTECTOMY    . COLON SURGERY    . ESOPHAGOGASTRODUODENOSCOPY (EGD) WITH PROPOFOL N/A 03/15/2016   Procedure: ESOPHAGOGASTRODUODENOSCOPY (EGD) WITH  PROPOFOL;  Surgeon: Manya Silvas, MD;  Location: [facility] ENDOSCOPY;  Service: Endoscopy;  Laterality: N/A;  . [place]     wears hearing aide  . TONSILLECTOMY      Home Medications:  Allergies as of 03/30/2017      Reactions   Ciprofloxacin Hcl Rash   Codeine Sulfate    Other reaction(s): Unknown   Doxycycline Hyclate Rash   Penicillin G Rash   Sulfamethoxazole-trimethoprim Rash      Medication List        Accurate as of 03/30/17  8:44 AM. Always use your most recent med list.          Iron (Ferrous Gluconate) 256 (28 Fe) MG Tabs Take 1 tablet by mouth daily.   omeprazole 40 MG capsule Commonly known as:  PRILOSEC Take 1 capsule by mouth 2 (two) times daily. Take 30 minutes before breakfast and take 30 minutes before dinner.   PREMARIN vaginal cream Generic drug:  conjugated estrogens   conjugated estrogens vaginal cream Commonly known as:  PREMARIN Place 1 Applicatorful vaginally daily. Apply 0.5mg  (pea-sized amount)  just inside the vaginal introitus with a finger-tip every night for two weeks and then Monday, Wednesday and Friday nights.   sertraline 50 MG tablet Commonly known as:  ZOLOFT   trimethoprim 100 MG tablet Commonly known as:  TRIMPEX Take 1 tablet (100 mg total) by mouth daily.  VITAMIN C PO Take by mouth.   VITAMIN D3 PO Take by mouth.       Allergies:  Allergies  Allergen Reactions  . Ciprofloxacin Hcl Rash  . Codeine Sulfate     Other reaction(s): Unknown  . Doxycycline Hyclate Rash  . Penicillin G Rash  . Sulfamethoxazole-Trimethoprim Rash    Family History: Family History  Problem Relation Age of Onset  . CVA Father   . Breast cancer Cousin        mat cousin  . Prostate cancer Neg Hx   . Kidney cancer Neg Hx   . Kidney disease Neg Hx   . Bladder Cancer Neg Hx     Social History:  reports that  has never smoked. she has never used smokeless tobacco. She reports that she does not drink alcohol or use  drugs.  ROS: UROLOGY Frequent Urination?: Yes Hard to postpone urination?: Yes Burning/pain with urination?: No Get up at night to urinate?: Yes Leakage of urine?: Yes Urine stream starts and stops?: No Trouble starting stream?: No Do you have to strain to urinate?: No Blood in urine?: No Urinary tract infection?: No Sexually transmitted disease?: No Injury to kidneys or bladder?: No Painful intercourse?: No Weak stream?: No Currently pregnant?: No Vaginal bleeding?: No Last menstrual period?: n  Gastrointestinal Nausea?: No Vomiting?: No Indigestion/heartburn?: No Diarrhea?: Yes Constipation?: No  Constitutional Fever: No Night sweats?: No Weight loss?: No Fatigue?: Yes  Skin Skin rash/lesions?: No Itching?: Yes  Eyes Blurred vision?: No Double vision?: No  Ears/Nose/Throat Sore throat?: No Sinus problems?: No  Hematologic/Lymphatic Swollen glands?: No Easy bruising?: Yes  Cardiovascular Leg swelling?: No Chest pain?: No  Respiratory Cough?: No Shortness of breath?: No  Endocrine Excessive thirst?: No  Musculoskeletal Back pain?: Yes Joint pain?: No  Neurological Headaches?: No Dizziness?: No  Psychologic Depression?: Yes Anxiety?: Yes  Physical Exam: BP 131/68   Pulse 73   Ht 5\' 3"  (1.6 m)   Wt 137 lb (62.1 kg)   BMI 24.27 kg/m   Constitutional: Well nourished. Alert and oriented, No acute distress. HEENT: [place] AT, moist mucus membranes. Trachea midline, no masses. Cardiovascular: No clubbing, cyanosis, or edema. Respiratory: Normal respiratory effort, no increased work of breathing. GI: Abdomen is soft, non tender, non distended, no abdominal masses. Liver and spleen not palpable.  No hernias appreciated.  Stool sample for occult testing is not indicated.   GU: No CVA tenderness.  No bladder fullness or masses.   Skin: No rashes, bruises or suspicious lesions. Lymph: No cervical or inguinal adenopathy. Neurologic: Grossly  intact, no focal deficits, moving all 4 extremities. Psychiatric: Normal mood and affect.  Laboratory Data: Lab Results  Component Value Date   CREATININE 0.92 08/25/2016    Urinalysis Positive for nitrite and many bacteria.  See Epic.   I have reviewed the labs.   1. History of hematuria  - CTU and cystoscopy x 2 and urine cytology - inflammatory changes noted on cystoscopy  - UA today negative for hematuria  - patient will report any gross hematuria  - RTC in one year for UA  2. Vaginal atrophy  - continue vaginal estrogen cream three nights weekly  - RTC in one year for exam  3. History of recurrent UTI's  - patient has completed three months of suppressive therapy  - reviewed UTI prevention strategies  - asked to contact us for symptoms of UTI's  - UA is suspicious for infection, continue trimethoprim, urine sent for  culture  - RTC in one year for symptom recheck  4. Mixed incontinence  - will have a trial of Myrbetriq 50 mg daily, # 28 samples given; I have advised the patient of the side effects of Myrbetriq, such as: elevation in BP, urinary retention and/or HA.  - RTC in 3 weeks for OAB questionnaire, BP check and PVR    Return in about 3 weeks (around 04/20/2017) for PVR and OAB questionnaire.  These notes generated with voice recognition software. I apologize for typographical errors.  [name], [facility] [address] [place], [place] [address] [phone]

## 2017-03-30 ENCOUNTER — Other Ambulatory Visit
Admission: RE | Admit: 2017-03-30 | Discharge: 2017-03-30 | Disposition: A | Discharge status: Home / Self Care | Payer: PPO | Source: Ambulatory Visit | Attending: Urology | Admitting: Urology

## 2017-03-30 ENCOUNTER — Encounter: Payer: Self-pay | Admitting: Urology

## 2017-03-30 ENCOUNTER — Ambulatory Visit (INDEPENDENT_AMBULATORY_CARE_PROVIDER_SITE_OTHER): Payer: PPO | Admitting: Urology

## 2017-03-30 VITALS — BP 131/68 | HR 73 | Ht 63.0 in | Wt 137.0 lb

## 2017-03-30 DIAGNOSIS — Z87440 Personal history of urinary (tract) infections: Secondary | ICD-10-CM | POA: Diagnosis not present

## 2017-03-30 DIAGNOSIS — N952 Postmenopausal atrophic vaginitis: Secondary | ICD-10-CM | POA: Diagnosis not present

## 2017-03-30 DIAGNOSIS — Z87448 Personal history of other diseases of urinary system: Secondary | ICD-10-CM | POA: Diagnosis not present

## 2017-03-30 DIAGNOSIS — N3946 Mixed incontinence: Secondary | ICD-10-CM | POA: Diagnosis not present

## 2017-03-30 LAB — URINALYSIS, COMPLETE (UACMP) WITH MICROSCOPIC
BILIRUBIN URINE: NEGATIVE
GLUCOSE, UA: NEGATIVE mg/dL
KETONES UR: NEGATIVE mg/dL
LEUKOCYTES UA: NEGATIVE
NITRITE: POSITIVE — AB
PH: 6 (ref 5.0–8.0)
Protein, ur: NEGATIVE mg/dL
RBC / HPF: NONE SEEN RBC/hpf (ref 0–5)
SPECIFIC GRAVITY, URINE: 1.015 (ref 1.005–1.030)

## 2017-03-30 NOTE — Progress Notes (Signed)
In and Out Catheterization  Patient is present today for a I & O catheterization due to recurrent UTI. Patient was cleaned and prepped in a sterile fashion with betadine and Lidocaine 2% jelly was instilled into the urethra.  A 14FR cath was inserted no complications were noted , 254ml of urine return was noted, urine was cloudy yellow in color. A clean urine sample was collected for UA &CX. Bladder was drained  And catheter was removed with out difficulty.    Preformed by: Fonnie Jarvis, CMA

## 2017-04-01 LAB — URINE CULTURE: Culture: >=100000 — AB

## 2017-04-02 ENCOUNTER — Telehealth: Payer: Self-pay

## 2017-04-02 MED ORDER — NITROFURANTOIN MONOHYD MACRO 100 MG PO CAPS: 100.0000 mg | ORAL_CAPSULE | Freq: Two times a day (BID) | ORAL | 0 refills | Status: DC

## 2017-04-02 NOTE — Telephone Encounter (Signed)
-----   Message from Nori Riis, PA-C sent at 04/01/2017  5:40 PM EST ----- Please let Heidi Arias's daughter know that she has a positive urine culture.  She needs to discontinue the trimethoprim and start nitrofurantoin 100 mg, bid x 7. We need a CATH UA two weeks after finishing her antibiotic.

## 2017-04-02 NOTE — Telephone Encounter (Signed)
Lattie Haw read pt daughter message. Sent in abx.

## 2017-04-05 DIAGNOSIS — L817 Pigmented purpuric dermatosis: Secondary | ICD-10-CM | POA: Diagnosis not present

## 2017-04-05 DIAGNOSIS — L853 Xerosis cutis: Secondary | ICD-10-CM | POA: Diagnosis not present

## 2017-04-05 DIAGNOSIS — Z859 Personal history of malignant neoplasm, unspecified: Secondary | ICD-10-CM | POA: Diagnosis not present

## 2017-04-05 DIAGNOSIS — L570 Actinic keratosis: Secondary | ICD-10-CM | POA: Diagnosis not present

## 2017-04-05 DIAGNOSIS — L578 Other skin changes due to chronic exposure to nonionizing radiation: Secondary | ICD-10-CM | POA: Diagnosis not present

## 2017-04-05 DIAGNOSIS — L821 Other seborrheic keratosis: Secondary | ICD-10-CM | POA: Diagnosis not present

## 2017-04-05 DIAGNOSIS — Z85820 Personal history of malignant melanoma of skin: Secondary | ICD-10-CM | POA: Diagnosis not present

## 2017-04-05 DIAGNOSIS — Z872 Personal history of diseases of the skin and subcutaneous tissue: Secondary | ICD-10-CM | POA: Diagnosis not present

## 2017-04-05 DIAGNOSIS — L918 Other hypertrophic disorders of the skin: Secondary | ICD-10-CM | POA: Diagnosis not present

## 2017-04-17 ENCOUNTER — Ambulatory Visit: Payer: PPO

## 2017-04-27 ENCOUNTER — Ambulatory Visit (INDEPENDENT_AMBULATORY_CARE_PROVIDER_SITE_OTHER): Payer: PPO | Admitting: Urology

## 2017-04-27 ENCOUNTER — Other Ambulatory Visit
Admission: RE | Admit: 2017-04-27 | Discharge: 2017-04-27 | Disposition: A | Discharge status: Home / Self Care | Payer: PPO | Source: Ambulatory Visit | Attending: Urology | Admitting: Urology

## 2017-04-27 ENCOUNTER — Encounter: Payer: Self-pay | Admitting: Urology

## 2017-04-27 VITALS — BP 133/67 | HR 74 | Ht 63.0 in | Wt 138.0 lb

## 2017-04-27 DIAGNOSIS — Z889 Allergy status to unspecified drugs, medicaments and biological substances status: Secondary | ICD-10-CM | POA: Diagnosis not present

## 2017-04-27 DIAGNOSIS — N390 Urinary tract infection, site not specified: Secondary | ICD-10-CM | POA: Diagnosis not present

## 2017-04-27 DIAGNOSIS — N3946 Mixed incontinence: Secondary | ICD-10-CM

## 2017-04-27 DIAGNOSIS — N952 Postmenopausal atrophic vaginitis: Secondary | ICD-10-CM | POA: Diagnosis not present

## 2017-04-27 LAB — URINALYSIS, COMPLETE (UACMP) WITH MICROSCOPIC
BACTERIA UA: NONE SEEN
BILIRUBIN URINE: NEGATIVE
Glucose, UA: NEGATIVE mg/dL
Hgb urine dipstick: NEGATIVE
KETONES UR: NEGATIVE mg/dL
LEUKOCYTES UA: NEGATIVE
Nitrite: NEGATIVE
PROTEIN: NEGATIVE mg/dL
RBC / HPF: NONE SEEN RBC/hpf (ref 0–5)
Specific Gravity, Urine: 1.015 (ref 1.005–1.030)
pH: 5.5 (ref 5.0–8.0)

## 2017-04-27 NOTE — Progress Notes (Signed)
In and Out Catheterization  Patient is present today for a I & O catheterization due to recurrent UTI. Patient was cleaned and prepped in a sterile fashion with betadine and Lidocaine 2% jelly was instilled into the urethra.  A 14FR cath was inserted no complications were noted , 18ml of urine return was noted, urine was dark yellow in color. A clean urine sample was collected for UA and culture. Bladder was drained  And catheter was removed with out difficulty.    Preformed by: Fonnie Jarvis, CMA

## 2017-04-27 NOTE — Progress Notes (Signed)
04/27/2017 5:09 PM   Heidi Arias [birth year] [id number]  Referring provider: Sallee Lange, NP [address] [place], [place] [address]  Chief Complaint  Patient presents with  . Follow-up    HPI: 84 year old female with multiple GU issues including history of hematuria status post negative workup, vaginal atrophy, recurrent UTIs, and mixed incontinence returns today for routine follow-up.  She was last seen in 03/2017 with Heidi Arias with symptoms of urinary tract infection.  Her catheterized UA was grossly positive and she ultimately grew staph epidermidis in her urine which was resistant to Bactrim which she had been previously on for suppression (trimethoprim 100 mg daily).  She was treated with Macrobid twice daily for 10 days and is not subsequently resumed her suppressive antibiotics.  She continues to use probiotics, topical estrogen cream, and cranberry tablets but recently ran out of this supplement.  She questions some of her hygiene practices today as she is not able to get in the bathtub and bathes herself using a "birdbath" technique.  Today, she can planes of her normal baseline urinary urgency frequency and incontinence.  These are unchanged from her baseline.  She does complain of some occasional dysuria but not consistently.  No fevers or chills.  No altered mental status.  Catheterized UA today is unremarkable.  She did try Myrbetriq 50 mg samples but stopped it as soon as she found out she had a UTI.  She is not resume this medication.  She is unsure whether this helped or not.   PMH: Past Medical History:  Diagnosis Date  . Anxiety   . Cancer ([place])    melanoma  . Cancer (HCC)    squamous cell  . Endometriosis   . GERD (gastroesophageal reflux disease)   . History of stomach ulcers   . Kidney failure   . Recurrent UTI (urinary tract infection)   . Rheumatoid arthritis [facility])     Surgical History: Past Surgical History:  Procedure  Laterality Date  . ABDOMINAL HYSTERECTOMY    . APPENDECTOMY    . CATARACT EXTRACTION    . CHOLECYSTECTOMY    . COLON SURGERY    . ESOPHAGOGASTRODUODENOSCOPY (EGD) WITH PROPOFOL N/A 03/15/2016   Procedure: ESOPHAGOGASTRODUODENOSCOPY (EGD) WITH PROPOFOL;  Surgeon: Manya Silvas, MD;  Location: [facility] ENDOSCOPY;  Service: Endoscopy;  Laterality: N/A;  . [place]     wears hearing aide  . TONSILLECTOMY      Home Medications:  Allergies as of 04/27/2017      Reactions   Ciprofloxacin Hcl Rash   Codeine Sulfate    Other reaction(s): Unknown   Doxycycline Hyclate Rash   Penicillin G Rash   Sulfamethoxazole-trimethoprim Rash      Medication List        Accurate as of 04/27/17  5:09 PM. Always use your most recent med list.          Iron (Ferrous Gluconate) 256 (28 Fe) MG Tabs Take 1 tablet by mouth daily.   nitrofurantoin (macrocrystal-monohydrate) 100 MG capsule Commonly known as:  MACROBID Take 1 capsule (100 mg total) by mouth every 12 (twelve) hours.   omeprazole 40 MG capsule Commonly known as:  PRILOSEC Take 1 capsule by mouth 2 (two) times daily. Take 30 minutes before breakfast and take 30 minutes before dinner.   PREMARIN vaginal cream Generic drug:  conjugated estrogens   conjugated estrogens vaginal cream Commonly known as:  PREMARIN Place 1 Applicatorful vaginally daily. Apply 0.5mg  (pea-sized amount)  just inside the vaginal introitus with a finger-tip every night for two weeks and then Monday, Wednesday and Friday nights.   sertraline 50 MG tablet Commonly known as:  ZOLOFT   trimethoprim 100 MG tablet Commonly known as:  TRIMPEX Take 1 tablet (100 mg total) by mouth daily.   VITAMIN C PO Take by mouth.   VITAMIN D3 PO Take by mouth.       Allergies:  Allergies  Allergen Reactions  . Ciprofloxacin Hcl Rash  . Codeine Sulfate     Other reaction(s): Unknown  . Doxycycline Hyclate Rash  . Penicillin G Rash  .  Sulfamethoxazole-Trimethoprim Rash    Family History: Family History  Problem Relation Age of Onset  . CVA Father   . Breast cancer Cousin        mat cousin  . Prostate cancer Neg Hx   . Kidney cancer Neg Hx   . Kidney disease Neg Hx   . Bladder Cancer Neg Hx     Social History:  reports that  has never smoked. she has never used smokeless tobacco. She reports that she does not drink alcohol or use drugs.  ROS: UROLOGY Frequent Urination?: Yes Hard to postpone urination?: No Burning/pain with urination?: No Get up at night to urinate?: Yes Leakage of urine?: Yes Urine stream starts and stops?: No Trouble starting stream?: No Do you have to strain to urinate?: No Blood in urine?: No Urinary tract infection?: Yes Sexually transmitted disease?: No Injury to kidneys or bladder?: No Painful intercourse?: No Weak stream?: No Currently pregnant?: No Vaginal bleeding?: No Last menstrual period?: n  Gastrointestinal Nausea?: No Vomiting?: No Indigestion/heartburn?: No Diarrhea?: No Constipation?: No  Constitutional Fever: No Night sweats?: No Weight loss?: No Fatigue?: No  Skin Skin rash/lesions?: No Itching?: No  Eyes Blurred vision?: No Double vision?: No  Ears/Nose/Throat Sore throat?: No Sinus problems?: No  Hematologic/Lymphatic Swollen glands?: No Easy bruising?: No  Cardiovascular Leg swelling?: No Chest pain?: No  Respiratory Cough?: No Shortness of breath?: No  Endocrine Excessive thirst?: No  Musculoskeletal Back pain?: No Joint pain?: No  Neurological Headaches?: No Dizziness?: No  Psychologic Depression?: No Anxiety?: No  Physical Exam: BP 133/67   Pulse 74   Ht 5\' 3"  (1.6 m)   Wt 138 lb (62.6 kg)   BMI 24.45 kg/m   Constitutional:  Alert and oriented, No acute distress. Accompanied daughter today. HEENT: [place] AT, moist mucus membranes.  Trachea midline, no masses.  Wearing hearing aids, hard of  hearing. Cardiovascular: No clubbing, cyanosis, or edema. Respiratory: Normal respiratory effort, no increased work of breathing. Skin: No rashes, bruises or suspicious lesions. Neurologic: Grossly intact, no focal deficits, moving all 4 extremities.  Ambulating slowly with cane. Psychiatric: Normal mood and affect.  Laboratory Data: Lab Results  Component Value Date   WBC 12.9 (H) 02/05/2016   HGB 11.3 (L) 02/05/2016   HCT 36.2 02/05/2016   MCV 73.8 (L) 02/05/2016   PLT 424 02/05/2016    Lab Results  Component Value Date   CREATININE 0.92 08/25/2016    Urinalysis Lab Results  Component Value Date   SPECGRAV 1.015 01/17/2017   PHUR 6.0 01/17/2017   COLORU Yellow 01/17/2017   APPEARANCEUR CLEAR 04/27/2017   LEUKOCYTESUR NEGATIVE 04/27/2017   PROTEINUR NEGATIVE 04/27/2017   GLUCOSEU NEGATIVE 04/27/2017   KETONESU Negative 01/17/2017   RBCU NONE SEEN 04/27/2017   BILIRUBINUR NEGATIVE 04/27/2017   UUROB 0.2 01/17/2017   NITRITE NEGATIVE 04/27/2017  Lab Results  Component Value Date   LABMICR See below: 01/17/2017   WBCUA 0-5 01/17/2017   RBCUA None seen 01/17/2017   LABEPIT 0-10 01/17/2017   BACTERIA NONE SEEN 04/27/2017    Pertinent Imaging: N/a  Assessment & Plan:    1. Recurrent UTI UA today reassuring after recent treatment for UTI Urine culture to rule out any occult infection Reviewed/educated about recurrent UTIs Reviewed symptoms of UTIs and if she develops symptoms, would recommend catheterized specimen only  Given her most recent UTI was resistant to Bactrim, would not recommend any further suppression with trimethoprim  Discussion today about antibiotic stewardship and other prevention techniques including continued use of topical estrogen, cranberry tabs, vitamin C, probiotics S treatment strategy Hygiene techniques also discussed today - Urinalysis, Complete w Microscopic; Future - Urine Culture; Future  2. Vaginal atrophy Any topical  estrogen cream at least 3 times per week  3. Mixed incontinence Advised to resume Myrbetriq 50 mg and let us know if this is efficacious for her OAB symptoms  4. Multiple drug allergies Discussion today about concern that she has multiple antibiotic drug allergies with increased in variable bacterial resistance patterns In the future, this may be problematic I have recommended consideration of allergy testing/referral to allergist to explore this further to ensure that she is truly allergic to these medications in order to be able to treat possible further urinary tract infections down the road She will let us know in the future if she like to be referred to an allergist for this purpose   Hollice Espy, MD  [place] [address], [place], [place] [address] [phone]  I spent 25 min with this patient of which greater than 50% was spent in counseling and coordination of care with the patient.

## 2017-04-29 LAB — URINE CULTURE: Culture: NO GROWTH

## 2017-04-30 ENCOUNTER — Telehealth: Payer: Self-pay | Admitting: Family Medicine

## 2017-04-30 ENCOUNTER — Encounter: Payer: Self-pay | Admitting: Family Medicine

## 2017-04-30 NOTE — Telephone Encounter (Signed)
Letter sent.

## 2017-04-30 NOTE — Telephone Encounter (Signed)
-----   Message from Hollice Espy, MD sent at 04/30/2017  9:05 AM EST ----- Infection has cleared! Great news.    Hollice Espy, MD

## 2017-05-02 NOTE — Telephone Encounter (Signed)
errouneous 

## 2017-06-01 ENCOUNTER — Other Ambulatory Visit: Payer: Self-pay

## 2017-06-01 ENCOUNTER — Ambulatory Visit (INDEPENDENT_AMBULATORY_CARE_PROVIDER_SITE_OTHER): Payer: PPO | Admitting: Urology

## 2017-06-01 ENCOUNTER — Other Ambulatory Visit
Admission: RE | Admit: 2017-06-01 | Discharge: 2017-06-01 | Disposition: A | Discharge status: Home / Self Care | Payer: PPO | Source: Ambulatory Visit | Attending: Urology | Admitting: Urology

## 2017-06-01 ENCOUNTER — Encounter: Payer: Self-pay | Admitting: Urology

## 2017-06-01 VITALS — BP 141/66 | HR 69 | Ht 62.0 in | Wt 140.0 lb

## 2017-06-01 DIAGNOSIS — N952 Postmenopausal atrophic vaginitis: Secondary | ICD-10-CM

## 2017-06-01 DIAGNOSIS — N390 Urinary tract infection, site not specified: Secondary | ICD-10-CM

## 2017-06-01 DIAGNOSIS — N3946 Mixed incontinence: Secondary | ICD-10-CM

## 2017-06-01 DIAGNOSIS — H903 Sensorineural hearing loss, bilateral: Secondary | ICD-10-CM | POA: Diagnosis not present

## 2017-06-01 DIAGNOSIS — H7012 Chronic mastoiditis, left ear: Secondary | ICD-10-CM | POA: Diagnosis not present

## 2017-06-01 LAB — URINALYSIS, COMPLETE (UACMP) WITH MICROSCOPIC
BILIRUBIN URINE: NEGATIVE
GLUCOSE, UA: NEGATIVE mg/dL
HGB URINE DIPSTICK: NEGATIVE
LEUKOCYTES UA: NEGATIVE
NITRITE: NEGATIVE
PROTEIN: NEGATIVE mg/dL
SPECIFIC GRAVITY, URINE: 1.025 (ref 1.005–1.030)
pH: 5 (ref 5.0–8.0)

## 2017-06-01 MED ORDER — MIRABEGRON ER 50 MG PO TB24: 50.0000 mg | ORAL_TABLET | Freq: Every day | ORAL | 3 refills | Status: DC

## 2017-06-01 NOTE — Progress Notes (Signed)
06/01/2017 10:42 AM   [name] March 27, 1932 [id number]  Referring provider: Sallee Lange, NP [address] [place], [place] [address]  Chief Complaint  Patient presents with  . Urinary Tract Infection  . Follow-up    HPI: 84 year old female with multiple GU issues including history of hematuria status post negative workup, vaginal atrophy, recurrent UTIs, and mixed incontinence returns today for routine follow-up who presents with a friend/neighbor,   Background history She was last seen in 03/2017 with [name] with symptoms of urinary tract infection.  Her catheterized UA was grossly positive and she ultimately grew staph epidermidis in her urine which was resistant to Bactrim which she had been previously on for suppression (trimethoprim 100 mg daily).  She was treated with Macrobid twice daily for 10 days and is not subsequently resumed her suppressive antibiotics.  She continues to use probiotics, topical estrogen cream, and cranberry tablets but recently ran out of this supplement.  She questions some of her hygiene practices today as she is not able to get in the bathtub and bathes herself using a "birdbath" technique.  She did try Myrbetriq 50 mg samples but stopped it as soon as she found out she had a UTI.  She is not resume this medication.  She is unsure whether this helped or not.  Today, she complains of frequency, urgency, nocturia, incontinence and some vaginal burning.  Patient denies any gross hematuria, dysuria or suprapubic/flank pain.  Patient denies any fevers, chills, nausea or vomiting.  She does admit to not drinking a lot of water and she is not taking probiotics.  She stated that she restarted her Myrbetriq on Sunday.  CATH UA was positive for 0-5 WBC's and RBC's.     PMH: Past Medical History:  Diagnosis Date  . Anxiety   . Cancer ([place])    melanoma  . Cancer (HCC)    squamous cell  . Endometriosis   . GERD (gastroesophageal  reflux disease)   . History of stomach ulcers   . Kidney failure   . Recurrent UTI (urinary tract infection)   . Rheumatoid arthritis [facility])     Surgical History: Past Surgical History:  Procedure Laterality Date  . ABDOMINAL HYSTERECTOMY    . APPENDECTOMY    . CATARACT EXTRACTION    . CHOLECYSTECTOMY    . COLON SURGERY    . ESOPHAGOGASTRODUODENOSCOPY (EGD) WITH PROPOFOL N/A 03/15/2016   Procedure: ESOPHAGOGASTRODUODENOSCOPY (EGD) WITH PROPOFOL;  Surgeon: Manya Silvas, MD;  Location: [facility] ENDOSCOPY;  Service: Endoscopy;  Laterality: N/A;  . [place]     wears hearing aide  . TONSILLECTOMY      Home Medications:  Allergies as of 06/01/2017      Reactions   Ciprofloxacin Hcl Rash   Codeine Sulfate    Other reaction(s): Unknown   Doxycycline Hyclate Rash   Penicillin G Rash   Sulfamethoxazole-trimethoprim Rash      Medication List        Accurate as of 06/01/17 10:42 AM. Always use your most recent med list.          Iron (Ferrous Gluconate) 256 (28 Fe) MG Tabs Take 1 tablet by mouth daily.   mirabegron ER 50 MG Tb24 tablet Commonly known as:  MYRBETRIQ Take 1 tablet (50 mg total) by mouth daily.   omeprazole 40 MG capsule Commonly known as:  PRILOSEC Take 1 capsule by mouth 2 (two) times daily. Take 30 minutes before breakfast and take 30  minutes before dinner.   PREMARIN vaginal cream Generic drug:  conjugated estrogens   conjugated estrogens vaginal cream Commonly known as:  PREMARIN Place 1 Applicatorful vaginally daily. Apply 0.5mg  (pea-sized amount)  just inside the vaginal introitus with a finger-tip every night for two weeks and then Monday, Wednesday and Friday nights.   sertraline 50 MG tablet Commonly known as:  ZOLOFT   VITAMIN C PO Take by mouth.   VITAMIN D3 PO Take by mouth.       Allergies:  Allergies  Allergen Reactions  . Ciprofloxacin Hcl Rash  . Codeine Sulfate     Other reaction(s): Unknown  . Doxycycline Hyclate  Rash  . Penicillin G Rash  . Sulfamethoxazole-Trimethoprim Rash    Family History: Family History  Problem Relation Age of Onset  . CVA Father   . Breast cancer Cousin        mat cousin  . Prostate cancer Neg Hx   . Kidney cancer Neg Hx   . Kidney disease Neg Hx   . Bladder Cancer Neg Hx     Social History:  reports that  has never smoked. she has never used smokeless tobacco. She reports that she does not drink alcohol or use drugs.  ROS: UROLOGY Frequent Urination?: Yes Hard to postpone urination?: Yes Burning/pain with urination?: No Get up at night to urinate?: Yes Leakage of urine?: Yes Urine stream starts and stops?: No Trouble starting stream?: No Do you have to strain to urinate?: No Blood in urine?: No Urinary tract infection?: Yes Sexually transmitted disease?: No Injury to kidneys or bladder?: No Painful intercourse?: No Weak stream?: No Currently pregnant?: No Vaginal bleeding?: No Last menstrual period?: n  Gastrointestinal Nausea?: No Vomiting?: No Indigestion/heartburn?: No Diarrhea?: No Constipation?: No  Constitutional Fever: No Night sweats?: No Weight loss?: No Fatigue?: Yes  Skin Skin rash/lesions?: No Itching?: Yes  Eyes Blurred vision?: No Double vision?: No  Ears/Nose/Throat Sore throat?: No Sinus problems?: No  Hematologic/Lymphatic Swollen glands?: No Easy bruising?: Yes  Cardiovascular Leg swelling?: Yes Chest pain?: No  Respiratory Cough?: No Shortness of breath?: No  Endocrine Excessive thirst?: No  Musculoskeletal Back pain?: Yes Joint pain?: No  Neurological Headaches?: No Dizziness?: No  Psychologic Depression?: No Anxiety?: No  Physical Exam: BP (!) 141/66   Pulse 69   Ht 5\' 2"  (1.575 m)   Wt 140 lb (63.5 kg)   BMI 25.61 kg/m   Constitutional: Well nourished. Alert and oriented, No acute distress. HEENT:  AT, moist mucus membranes. Trachea midline, no masses. Cardiovascular: No  clubbing, cyanosis, or edema. Respiratory: Normal respiratory effort, no increased work of breathing. GI: Abdomen is soft, non tender, non distended, no abdominal masses. Liver and spleen not palpable.  No hernias appreciated.  Stool sample for occult testing is not indicated.   GU: No CVA tenderness.  No bladder fullness or masses.  Atrophic external genitalia, normal pubic hair distribution, no lesions.  Normal urethral meatus, no lesions, no prolapse, no discharge.   No urethral masses, tenderness and/or tenderness. No bladder fullness, tenderness or masses. Pale vagina mucosa, poor estrogen effect, no discharge, no lesions, poor pelvic support, Grade II cystocele is noted.  No rectocele noted. Cervix and uterus are surgically absent.  No adnexal/parametria masses or tenderness noted.  Anus and perineum are without rashes or lesions.    Skin: No rashes, bruises or suspicious lesions. Lymph: No cervical or inguinal adenopathy. Neurologic: Grossly intact, no focal deficits, moving all 4 extremities. Psychiatric: Normal  mood and affect.   Laboratory Data: Lab Results  Component Value Date   WBC 12.9 (H) 02/05/2016   HGB 11.3 (L) 02/05/2016   HCT 36.2 02/05/2016   MCV 73.8 (L) 02/05/2016   PLT 424 02/05/2016    Lab Results  Component Value Date   CREATININE 0.92 08/25/2016   Urinalysis CATH UA positive for 0-5 WBC's and 0-5 RBC's.  See Epic.    Pertinent Imaging: N/a  Assessment & Plan:    1. Recurrent UTI UA today reassuring - Urine culture to rule out any occult infection Reviewed/educated about recurrent UTIs Reviewed symptoms of UTIs and if she develops symptoms, would recommend catheterized specimen only  Discussion today about antibiotic stewardship and other prevention techniques including continued use of topical estrogen, cranberry tabs, vitamin C, probiotics treatment strategy Hygiene techniques also discussed today- suggested Hibiclens  2. Vaginal atrophy Any topical  estrogen cream at least 3 times per week  3. Mixed incontinence Continue  Myrbetriq 50 mg; refills given  4. Multiple drug allergies Discussion today about concern that she has multiple antibiotic drug allergies with increased in variable bacterial resistance patterns In the future, this may be problematic I have recommended consideration of allergy testing/referral to allergist to explore this further to ensure that she is truly allergic to these medications in order to be able to treat possible further urinary tract infections down the road She will let us know in the future if she like to be referred to an allergist for this purpose   [name], [place] [address], [place], [place] [address] [phone]

## 2017-06-01 NOTE — Patient Instructions (Addendum)
                                             Urinary Tract Infection Prevention Patient Education Stay Hydrated: Urinary tract infections (UTIs) are less likely to occur in someone who is drinking enough water to promote regular urination, so it is very important to stay hydrated in order to help flush out bacteria from the urinary tract. Respond to "Nature's Call": It is always a good idea to urinate as soon as you feel the need. While "holding it in" does not directly cause an infection, it can cause overdistension that can damage the lining of the bladder, making it more vulnerable to bacteria. Remove Tampons Before Going: Remember to always take out tampons before urinating, and change tampons often.  Practice Proper Bathroom Hygiene: To keep bacteria near the urethral opening to a minimum, it is important to practice proper wiping techniques (i.e. front to back wiping) to help prevent rectal bacteria from entering the uretro-genital area. It can also be helpful to take showers and avoid soaking in the bathtub.  Take a Vitamin C Supplement: About 1,000 milligrams of vitamin C taken daily can help inhibit the growth of some bacteria by acidifying the urine. Maintain Control with Cranberries: Cranberries contain hippuronic acid, which is a natural antiseptic that may help prevent the adherence of bacteria to the bladder lining. Drinking 100% pure cranberry juice or taking over the counter cranberry supplements twice daily may help to prevent an infection. However, it is important to note that cranberry juices/supplements are not helpful once a urinary tract infection (UTI) is present. Strengthen Your Core: Often, a lazy bladder (unable to empty urine properly) occurs due to lower back problem, so consider doing exercises to help strengthen your back, pelvic floor, and stomach muscles.  Pay Attention to Your Urine: Your urine can change color for a variety of reasons, including from the medications you  take, so pay close attention to it to monitor your overall health. One key thing to note is that if your urine is typically a darker yellow, your body is dehydrated, so you need to step up your water intake.   Vaginal cream on Monday, Wednesday and Friday nights.

## 2017-06-02 LAB — URINE CULTURE: CULTURE: NO GROWTH

## 2017-06-04 ENCOUNTER — Telehealth: Payer: Self-pay | Admitting: Urology

## 2017-06-04 ENCOUNTER — Telehealth: Payer: Self-pay

## 2017-06-04 NOTE — Telephone Encounter (Signed)
App made and mailed to patient ° °Heidi Arias °

## 2017-06-04 NOTE — Telephone Encounter (Signed)
Patient's daughter called the office this morning requesting results of urine culture.  Note from Remy:  Please let Heidi Arias know that her urine culture was negative. I would like her to follow up in one year or sooner if she should have symptoms of an UTI.  Advised patient's daughter.  She expressed understanding.

## 2017-06-04 NOTE — Telephone Encounter (Signed)
Patient's daughter notified, please schedule 1 year follow up in [place] thanks

## 2017-06-04 NOTE — Telephone Encounter (Signed)
-----   Message from Nori Riis, PA-C sent at 06/03/2017  6:50 PM EDT ----- Please let Heidi Arias know that her urine culture was negative.  I would like her to follow up in one year or sooner if she should have symptoms of an UTI.

## 2017-08-06 ENCOUNTER — Telehealth: Payer: Self-pay | Admitting: Urology

## 2017-08-06 NOTE — Telephone Encounter (Signed)
noted 

## 2017-08-06 NOTE — Telephone Encounter (Signed)
Pt is having UTI symptoms, hurts when she urinates and pressure.  She will need a cath U/A.  Daughter lives in [place] and pt lives in [place].  I added her tomorrow at 8 per daughter's request.

## 2017-08-07 ENCOUNTER — Other Ambulatory Visit: Payer: Self-pay | Admitting: Urology

## 2017-08-07 ENCOUNTER — Ambulatory Visit (INDEPENDENT_AMBULATORY_CARE_PROVIDER_SITE_OTHER): Payer: PPO | Admitting: Family Medicine

## 2017-08-07 DIAGNOSIS — N390 Urinary tract infection, site not specified: Secondary | ICD-10-CM | POA: Diagnosis not present

## 2017-08-07 DIAGNOSIS — R300 Dysuria: Secondary | ICD-10-CM

## 2017-08-07 LAB — URINALYSIS, COMPLETE
BILIRUBIN UA: NEGATIVE
Glucose, UA: NEGATIVE
Ketones, UA: NEGATIVE
Nitrite, UA: POSITIVE — AB
Protein, UA: NEGATIVE
SPEC GRAV UA: 1.015 (ref 1.005–1.030)
Urobilinogen, Ur: 0.2 mg/dL (ref 0.2–1.0)
pH, UA: 5.5 (ref 5.0–7.5)

## 2017-08-07 LAB — MICROSCOPIC EXAMINATION

## 2017-08-07 MED ORDER — NITROFURANTOIN MONOHYD MACRO 100 MG PO CAPS: 100.0000 mg | ORAL_CAPSULE | Freq: Two times a day (BID) | ORAL | 0 refills | Status: DC

## 2017-08-07 NOTE — Progress Notes (Signed)
I do not see an urine culture ordered for Heidi Arias.  Would you place the order?

## 2017-08-07 NOTE — Addendum Note (Signed)
Addended by: Kyra Manges on: 08/07/2017 11:56 AM   Modules accepted: Orders

## 2017-08-07 NOTE — Progress Notes (Signed)
Patient presents today with dysuria and flank pain. Her symptoms started 4 days ago. She has not been on ABX or had any Urological surgeries in the last 30 days. A cath specimen was collected for UA, UCX.  In and Out Catheterization  Patient is present today for a I & O catheterization due to dysuria. Patient was cleaned and prepped in a sterile fashion with betadine and Lidocaine 2% jelly was instilled into the urethra.  A 14FR cath was inserted no complications were noted , 269ml of urine return was noted, urine was yellow in color. A clean urine sample was collected for UA, UCX. Bladder was drained  And catheter was removed with out difficulty.    Preformed by: Elberta Leatherwood, CMA

## 2017-08-09 ENCOUNTER — Telehealth: Payer: Self-pay

## 2017-08-09 LAB — CULTURE, URINE COMPREHENSIVE

## 2017-08-09 NOTE — Telephone Encounter (Signed)
-----   Message from Hollice Espy, MD sent at 08/09/2017  4:56 PM EDT ----- +UCX, symptomatic.  Please treat with Macrobid bid x 10 days.  Hollice Espy, MD

## 2017-08-09 NOTE — Telephone Encounter (Signed)
Called pt's daughter informed her of the information below. Pt gave verbal understanding.

## 2017-08-29 ENCOUNTER — Other Ambulatory Visit: Payer: Self-pay | Admitting: Urology

## 2017-08-29 ENCOUNTER — Telehealth: Payer: Self-pay | Admitting: Urology

## 2017-08-29 NOTE — Telephone Encounter (Signed)
Was Heidi Arias able to complete her course of Macrobid in May?  If not, we need to send in a script for Macrobid.  If she was able to take her Macrobid and she is still having symptoms, she needs to have a CATH UA and culture prior to prescribing more antibiotics.

## 2017-08-30 ENCOUNTER — Telehealth: Payer: Self-pay | Admitting: Urology

## 2017-08-30 NOTE — Telephone Encounter (Signed)
Spoke to patient's daughter and she will bring her mother in to [facility] tomorrow at [place]

## 2017-08-30 NOTE — Telephone Encounter (Signed)
Pt dghtr called office LMOM stating that her mother was dx with UTI May 16, states mom (pt) has completed her abx but doesn't feel it's 100% clear, [name], pt dghtr is asking if more abx could be called in for her mother. Please advise [name] at [phone].

## 2017-08-31 ENCOUNTER — Other Ambulatory Visit
Admission: RE | Admit: 2017-08-31 | Discharge: 2017-08-31 | Disposition: A | Discharge status: Home / Self Care | Payer: PPO | Source: Ambulatory Visit | Attending: Urology | Admitting: Urology

## 2017-08-31 ENCOUNTER — Ambulatory Visit (INDEPENDENT_AMBULATORY_CARE_PROVIDER_SITE_OTHER): Payer: PPO | Admitting: Urology

## 2017-08-31 DIAGNOSIS — N390 Urinary tract infection, site not specified: Secondary | ICD-10-CM | POA: Insufficient documentation

## 2017-08-31 DIAGNOSIS — R3989 Other symptoms and signs involving the genitourinary system: Secondary | ICD-10-CM | POA: Insufficient documentation

## 2017-08-31 LAB — URINALYSIS, COMPLETE (UACMP) WITH MICROSCOPIC
BILIRUBIN URINE: NEGATIVE
Bacteria, UA: NONE SEEN
GLUCOSE, UA: NEGATIVE mg/dL
Hgb urine dipstick: NEGATIVE
Ketones, ur: NEGATIVE mg/dL
LEUKOCYTES UA: NEGATIVE
NITRITE: NEGATIVE
PH: 6 (ref 5.0–8.0)
PROTEIN: NEGATIVE mg/dL
Specific Gravity, Urine: 1.015 (ref 1.005–1.030)
WBC, UA: NONE SEEN WBC/hpf (ref 0–5)

## 2017-08-31 NOTE — Progress Notes (Signed)
In and Out Catheterization  Patient is present today for a I & O catheterization due to possible infection. Patient was cleaned and prepped in a sterile fashion with betadine and Lidocaine 2% jelly was instilled into the urethra.  A 14FR cath was inserted no complications were noted , 266ml of urine return was noted, urine was yellow in color. A clean urine sample was collected for UA and culture. Bladder was drained  And catheter was removed with out difficulty.    Preformed by: Fonnie Jarvis, CMA  Follow up/ Additional notes: will call with results

## 2017-09-01 LAB — URINE CULTURE: Culture: NO GROWTH

## 2017-09-03 ENCOUNTER — Telehealth: Payer: Self-pay

## 2017-09-03 ENCOUNTER — Other Ambulatory Visit: Payer: Self-pay | Admitting: Urology

## 2017-09-03 NOTE — Telephone Encounter (Signed)
Patient's daughter notified, confirmed patient is taking Myrbetriq

## 2017-09-03 NOTE — Telephone Encounter (Signed)
-----   Message from Nori Riis, PA-C sent at 09/01/2017 11:40 AM EDT ----- Please let Heidi Arias and her daughter know that her urine culture was negative.  Is she taking Myrbetriq?

## 2017-09-04 NOTE — Telephone Encounter (Signed)
Pt daughter called asking for refill of vaginal cream (premarin) she unaware of the Mybetriq question noted in previous message. Please call [name] at [phone] to clarify. Thanks.

## 2017-09-07 DIAGNOSIS — R7302 Impaired glucose tolerance (oral): Secondary | ICD-10-CM | POA: Diagnosis not present

## 2017-09-07 DIAGNOSIS — K219 Gastro-esophageal reflux disease without esophagitis: Secondary | ICD-10-CM | POA: Diagnosis not present

## 2017-09-07 DIAGNOSIS — D509 Iron deficiency anemia, unspecified: Secondary | ICD-10-CM | POA: Diagnosis not present

## 2017-09-07 DIAGNOSIS — F419 Anxiety disorder, unspecified: Secondary | ICD-10-CM | POA: Diagnosis not present

## 2017-09-07 DIAGNOSIS — N183 Chronic kidney disease, stage 3 (moderate): Secondary | ICD-10-CM | POA: Diagnosis not present

## 2017-09-07 DIAGNOSIS — Z79899 Other long term (current) drug therapy: Secondary | ICD-10-CM | POA: Diagnosis not present

## 2017-10-04 DIAGNOSIS — Z859 Personal history of malignant neoplasm, unspecified: Secondary | ICD-10-CM | POA: Diagnosis not present

## 2017-10-04 DIAGNOSIS — Z1283 Encounter for screening for malignant neoplasm of skin: Secondary | ICD-10-CM | POA: Diagnosis not present

## 2017-10-04 DIAGNOSIS — L578 Other skin changes due to chronic exposure to nonionizing radiation: Secondary | ICD-10-CM | POA: Diagnosis not present

## 2017-10-04 DIAGNOSIS — L570 Actinic keratosis: Secondary | ICD-10-CM | POA: Diagnosis not present

## 2017-10-04 DIAGNOSIS — Z872 Personal history of diseases of the skin and subcutaneous tissue: Secondary | ICD-10-CM | POA: Diagnosis not present

## 2017-10-04 DIAGNOSIS — Z85820 Personal history of malignant melanoma of skin: Secondary | ICD-10-CM | POA: Diagnosis not present

## 2017-11-22 DIAGNOSIS — H43813 Vitreous degeneration, bilateral: Secondary | ICD-10-CM | POA: Diagnosis not present

## 2017-11-30 DIAGNOSIS — H7012 Chronic mastoiditis, left ear: Secondary | ICD-10-CM | POA: Diagnosis not present

## 2017-11-30 DIAGNOSIS — H7011 Chronic mastoiditis, right ear: Secondary | ICD-10-CM | POA: Diagnosis not present

## 2017-11-30 DIAGNOSIS — H903 Sensorineural hearing loss, bilateral: Secondary | ICD-10-CM | POA: Diagnosis not present

## 2018-01-04 ENCOUNTER — Ambulatory Visit (INDEPENDENT_AMBULATORY_CARE_PROVIDER_SITE_OTHER): Payer: PPO | Admitting: Urology

## 2018-01-04 ENCOUNTER — Encounter: Payer: Self-pay | Admitting: Urology

## 2018-01-04 ENCOUNTER — Other Ambulatory Visit
Admission: RE | Admit: 2018-01-04 | Discharge: 2018-01-04 | Disposition: A | Discharge status: Home / Self Care | Payer: PPO | Source: Ambulatory Visit | Attending: Urology | Admitting: Urology

## 2018-01-04 VITALS — BP 146/69 | HR 75 | Ht 63.0 in | Wt 140.0 lb

## 2018-01-04 DIAGNOSIS — N390 Urinary tract infection, site not specified: Secondary | ICD-10-CM | POA: Diagnosis not present

## 2018-01-04 DIAGNOSIS — N3000 Acute cystitis without hematuria: Secondary | ICD-10-CM | POA: Diagnosis not present

## 2018-01-04 DIAGNOSIS — N3946 Mixed incontinence: Secondary | ICD-10-CM

## 2018-01-04 LAB — URINALYSIS, COMPLETE (UACMP) WITH MICROSCOPIC
Bilirubin Urine: NEGATIVE
GLUCOSE, UA: NEGATIVE mg/dL
Ketones, ur: NEGATIVE mg/dL
Leukocytes, UA: NEGATIVE
Nitrite: POSITIVE — AB
PH: 5.5 (ref 5.0–8.0)
PROTEIN: NEGATIVE mg/dL
SPECIFIC GRAVITY, URINE: 1.01 (ref 1.005–1.030)

## 2018-01-04 MED ORDER — NITROFURANTOIN MONOHYD MACRO 100 MG PO CAPS: 100.0000 mg | ORAL_CAPSULE | Freq: Two times a day (BID) | ORAL | 0 refills | Status: DC

## 2018-01-04 NOTE — Progress Notes (Signed)
01/04/2018 9:44 AM   Heidi Arias 1932/03/18 [id number]  Referring provider: Sallee Lange, NP [address] [place], [place] [address]  Chief Complaint  Patient presents with  . Recurrent UTI    HPI: 84 year old female with recurrent urinary tract infections who presents today with acute onset urinary symptoms.  She reports that she is developed dysuria, urgency and frequency over the past several days.  She is concerned she has an infection.  No fevers or chills.  No flank pain.  No altered mental status.  With catheterized today in the office with a suspicious appearing urine.  She continues to use probiotics, topical estrogen cream, cranberry tablets.  She was on suppressive therapy for about 3 months earlier this year but not currently.  PMH: Past Medical History:  Diagnosis Date  . Anxiety   . Cancer ([place])    melanoma  . Cancer ([facility])    squamous cell  . Endometriosis   . GERD (gastroesophageal reflux disease)   . History of stomach ulcers   . Kidney failure   . Recurrent UTI (urinary tract infection)   . Rheumatoid arthritis [facility])     Surgical History: Past Surgical History:  Procedure Laterality Date  . ABDOMINAL HYSTERECTOMY    . APPENDECTOMY    . CATARACT EXTRACTION    . CHOLECYSTECTOMY    . COLON SURGERY    . ESOPHAGOGASTRODUODENOSCOPY (EGD) WITH PROPOFOL N/A 03/15/2016   Procedure: ESOPHAGOGASTRODUODENOSCOPY (EGD) WITH PROPOFOL;  Surgeon: Manya Silvas, MD;  Location: [facility] ENDOSCOPY;  Service: Endoscopy;  Laterality: N/A;  . [place]     wears hearing aide  . TONSILLECTOMY      Home Medications:  Allergies as of 01/04/2018      Reactions   Ciprofloxacin Hcl Rash   Codeine Sulfate    Other reaction(s): Unknown   Doxycycline Hyclate Rash   Penicillin G Rash   Sulfamethoxazole-trimethoprim Rash      Medication List        Accurate as of 01/04/18 11:59 PM. Always use your most recent med list.            conjugated estrogens vaginal cream Commonly known as:  PREMARIN Place 1 Applicatorful vaginally daily. Apply 0.5mg  (pea-sized amount)  just inside the vaginal introitus with a finger-tip every night for two weeks and then Monday, Wednesday and Friday nights.   Iron (Ferrous Gluconate) 256 (28 Fe) MG Tabs Take 1 tablet by mouth daily.   mirabegron ER 50 MG Tb24 tablet Commonly known as:  MYRBETRIQ Take 1 tablet (50 mg total) by mouth daily.   nitrofurantoin (macrocrystal-monohydrate) 100 MG capsule Commonly known as:  MACROBID Take 1 capsule (100 mg total) by mouth 2 (two) times daily.   nitrofurantoin (macrocrystal-monohydrate) 100 MG capsule Commonly known as:  MACROBID Take 1 capsule (100 mg total) by mouth every 12 (twelve) hours.   omeprazole 40 MG capsule Commonly known as:  PRILOSEC Take 1 capsule by mouth 2 (two) times daily. Take 30 minutes before breakfast and take 30 minutes before dinner.   sertraline 50 MG tablet Commonly known as:  ZOLOFT   VITAMIN C PO Take by mouth.   VITAMIN D3 PO Take by mouth.       Allergies:  Allergies  Allergen Reactions  . Ciprofloxacin Hcl Rash  . Codeine Sulfate     Other reaction(s): Unknown  . Doxycycline Hyclate Rash  . Penicillin G Rash  . Sulfamethoxazole-Trimethoprim Rash    Family History: Family  History  Problem Relation Age of Onset  . CVA Father   . Breast cancer Cousin        mat cousin  . Prostate cancer Neg Hx   . Kidney cancer Neg Hx   . Kidney disease Neg Hx   . Bladder Cancer Neg Hx     Social History:  reports that she has never smoked. She has never used smokeless tobacco. She reports that she does not drink alcohol or use drugs.  ROS: UROLOGY Frequent Urination?: Yes Hard to postpone urination?: No Burning/pain with urination?: Yes Get up at night to urinate?: Yes Leakage of urine?: Yes Urine stream starts and stops?: Yes Trouble starting stream?: No Do you have to strain to urinate?:  No Blood in urine?: No Urinary tract infection?: Yes Sexually transmitted disease?: No Injury to kidneys or bladder?: No Painful intercourse?: No Weak stream?: No Currently pregnant?: No Vaginal bleeding?: No Last menstrual period?: n  Gastrointestinal Nausea?: No Vomiting?: No Indigestion/heartburn?: No Diarrhea?: Yes Constipation?: No  Constitutional Fever: No Night sweats?: No Weight loss?: No Fatigue?: Yes  Skin Skin rash/lesions?: No Itching?: Yes  Eyes Blurred vision?: No Double vision?: No  Ears/Nose/Throat Sore throat?: No Sinus problems?: No  Hematologic/Lymphatic Swollen glands?: No Easy bruising?: No  Cardiovascular Leg swelling?: No Chest pain?: No  Respiratory Cough?: No Shortness of breath?: No  Endocrine Excessive thirst?: No  Musculoskeletal Back pain?: Yes Joint pain?: No  Neurological Headaches?: No Dizziness?: No  Psychologic Depression?: No Anxiety?: No  Physical Exam: BP (!) 146/69   Pulse 75   Ht 5\' 3"  (1.6 m)   Wt 140 lb (63.5 kg)   BMI 24.80 kg/m   Constitutional:  Alert and oriented, No acute distress. HEENT: [place] AT, moist mucus membranes.  Trachea midline, no masses. Cardiovascular: No clubbing, cyanosis, or edema. Respiratory: Normal respiratory effort, no increased work of breathing. GI: Abdomen is soft, nontender, nondistended, no abdominal masses GU: Normal external genitalia. Skin: No rashes, bruises or suspicious lesions. Neurologic: Grossly intact, no focal deficits, moving all 4 extremities. Psychiatric: Normal mood and affect.  Laboratory Data: Lab Results  Component Value Date   WBC 12.9 (H) 02/05/2016   HGB 11.3 (L) 02/05/2016   HCT 36.2 02/05/2016   MCV 73.8 (L) 02/05/2016   PLT 424 02/05/2016    Lab Results  Component Value Date   CREATININE 0.92 08/25/2016    Urinalysis Component     Latest Ref Rng & Units 01/04/2018  Color, Urine     YELLOW YELLOW  Appearance     CLEAR CLEAR   Glucose, UA     NEGATIVE mg/dL NEGATIVE  Bilirubin Urine     NEGATIVE NEGATIVE  Ketones, ur     NEGATIVE mg/dL NEGATIVE  Specific Gravity, Urine     1.005 - 1.030 1.010  Hgb urine dipstick     NEGATIVE SMALL (A)  pH     5.0 - 8.0 5.5  Protein     NEGATIVE mg/dL NEGATIVE  Nitrite     NEGATIVE POSITIVE (A)  Leukocytes, UA     NEGATIVE NEGATIVE  RBC / HPF     0 - 5 RBC/hpf 0-5  WBC, UA     0 - 5 WBC/hpf 6-10  Bacteria, UA     NONE SEEN MANY (A)  Squamous Epithelial / LPF     0 - 5 0-5    Pertinent Imaging: n/a  Assessment & Plan:    1. Recurrent UTI Continue regimen with topical  estrogen, cranberry tablets, and probiotic Consider resuming suppressive antibiotics if she continues to have more than 3-4 infections annually Best served with catheterized specimen to ensure that this is a true urinary tract infection in future as performed today, well-tolerated - Urinalysis, Complete w Microscopic; Future - Urine Culture; Future  2. Acute cystitis without hematuria Highly suspicious urine with WBC and nitrites as well as many bacteria We will go ahead and treat with Macrobid and adjust as needed Follow-up urine culture  3. Mixed incontinence Continue Myrbetriq 50 mg  F/u as needed  Hollice Espy, MD  [facility] [address], [place], [place] [address] [phone]

## 2018-01-04 NOTE — Progress Notes (Signed)
In and Out Catheterization  Patient is present today for a I & O catheterization due to UTI. Patient was cleaned and prepped in a sterile fashion with betadine and Lidocaine 2% jelly was instilled into the urethra.  A 14FR cath was inserted no complications were noted , 51ml of urine return was noted, urine was yellow in color. A clean urine sample was collected for UA and CX. Bladder was drained  And catheter was removed with out difficulty.    Preformed by: Fonnie Jarvis, CMA

## 2018-01-07 ENCOUNTER — Telehealth: Payer: Self-pay

## 2018-01-07 LAB — URINE CULTURE: Culture: >=100000 — AB

## 2018-01-07 NOTE — Telephone Encounter (Signed)
Called the lab in [place] and spoke with Cyrstal, she states sensativities were done they are just not crossing over. She was able to tell me over the phone it was sensative to Sulfa, Macrobid, Cipro, Cephtriaxone. Should I send in [place] due to patient's allergies?

## 2018-01-07 NOTE — Telephone Encounter (Signed)
Patient's daughter notified on vmail

## 2018-01-07 NOTE — Telephone Encounter (Signed)
I prescribed Macrobid on Friday.  She should be good.  Thanks.  Hollice Espy, MD

## 2018-01-07 NOTE — Telephone Encounter (Signed)
-----   Message from Hollice Espy, MD sent at 01/07/2018 12:35 PM EDT ----- Please have the lab add sensitivities.    Hollice Espy, MD

## 2018-01-15 ENCOUNTER — Telehealth: Payer: Self-pay | Admitting: Family Medicine

## 2018-01-15 NOTE — Telephone Encounter (Signed)
[name] notified and appointment scheduled.

## 2018-01-15 NOTE — Telephone Encounter (Signed)
Repeat UA/urine culture is imperative here both to understand whether or not there is persistent organisms, if the organism has become resistant to antibiotics, and if this is just residual inflammation/bladder irritation from the infection rather than an ongoing infection.  Her options are to bring the patient here to [facility] or [place] on Friday.  I am not comfortable filling additional antibiotics without another urine sample.     If needed, she can just drop off a voided urine in [place] but this is not ideal.    Hollice Espy, MD

## 2018-01-15 NOTE — Telephone Encounter (Signed)
Heidi Arias called stating patient does not feel she has 100 percent gotten rid of the last UTI. She is asking to get more ABX. I notified her that she may need to bring her in for a urine and she states she can't bring her to [facility] she can only take her to [place]. Please advise

## 2018-01-18 ENCOUNTER — Ambulatory Visit (INDEPENDENT_AMBULATORY_CARE_PROVIDER_SITE_OTHER): Payer: PPO | Admitting: Urology

## 2018-01-18 ENCOUNTER — Other Ambulatory Visit
Admission: RE | Admit: 2018-01-18 | Discharge: 2018-01-18 | Disposition: A | Discharge status: Home / Self Care | Payer: PPO | Source: Ambulatory Visit | Attending: Urology | Admitting: Urology

## 2018-01-18 ENCOUNTER — Encounter: Payer: Self-pay | Admitting: Urology

## 2018-01-18 VITALS — BP 149/75 | HR 75

## 2018-01-18 DIAGNOSIS — N390 Urinary tract infection, site not specified: Secondary | ICD-10-CM | POA: Insufficient documentation

## 2018-01-18 LAB — URINALYSIS, COMPLETE (UACMP) WITH MICROSCOPIC
Bacteria, UA: NONE SEEN
Bilirubin Urine: NEGATIVE
Glucose, UA: NEGATIVE mg/dL
Ketones, ur: NEGATIVE mg/dL
Leukocytes, UA: NEGATIVE
Nitrite: NEGATIVE
Protein, ur: NEGATIVE mg/dL
WBC, UA: NONE SEEN WBC/hpf (ref 0–5)
pH: 6.5 (ref 5.0–8.0)

## 2018-01-18 NOTE — Progress Notes (Signed)
In and Out Catheterization  Patient is present today for a I & O catheterization due to recurrenti UTI. Patient was cleaned and prepped in a sterile fashion with betadine and Lidocaine 2% jelly was instilled into the urethra.  A 14FR cath was inserted no complications were noted , 355ml of urine return was noted, urine was clear yellow in color. A clean urine sample was collected for UA and Culture. Bladder was drained  And catheter was removed with out difficulty.    Preformed by: Fonnie Jarvis, CMA   Follow up/ Additional notes: Will call with results

## 2018-01-20 LAB — URINE CULTURE: CULTURE: NO GROWTH

## 2018-03-29 DIAGNOSIS — K219 Gastro-esophageal reflux disease without esophagitis: Secondary | ICD-10-CM | POA: Diagnosis not present

## 2018-03-29 DIAGNOSIS — Z0000 Encounter for general adult medical examination without abnormal findings: Secondary | ICD-10-CM | POA: Diagnosis not present

## 2018-03-29 DIAGNOSIS — T8189XA Other complications of procedures, not elsewhere classified, initial encounter: Secondary | ICD-10-CM | POA: Diagnosis not present

## 2018-03-29 DIAGNOSIS — E119 Type 2 diabetes mellitus without complications: Secondary | ICD-10-CM | POA: Diagnosis not present

## 2018-03-29 DIAGNOSIS — J00 Acute nasopharyngitis [common cold]: Secondary | ICD-10-CM | POA: Diagnosis not present

## 2018-03-29 DIAGNOSIS — Z79899 Other long term (current) drug therapy: Secondary | ICD-10-CM | POA: Diagnosis not present

## 2018-03-29 DIAGNOSIS — F419 Anxiety disorder, unspecified: Secondary | ICD-10-CM | POA: Diagnosis not present

## 2018-03-29 DIAGNOSIS — Z23 Encounter for immunization: Secondary | ICD-10-CM | POA: Diagnosis not present

## 2018-06-03 ENCOUNTER — Other Ambulatory Visit: Payer: Self-pay | Admitting: Urology

## 2018-06-04 NOTE — Progress Notes (Incomplete)
06/06/2018 3:59 PM   Heidi Arias 1932/02/23 [id number]  Referring provider: Sallee Lange, NP [address] [place], [place] [address]  No chief complaint on file.   HPI: Heidi Arias is a 85 y.o. female Caucasian with multiple GU issues including history of hematuria status post negative workup, vaginal atrophy, recurrent UTIs, and mixed incontinence returns today for routine follow-up.  Background history She was last seen in 03/2017 with Heidi Arias with symptoms of urinary tract infection.  Her catheterized UA was grossly positive and she ultimately grew staph epidermidis in her urine which was resistant to Bactrim which she had been previously on for suppression (trimethoprim 100 mg daily).  She was treated with Macrobid twice daily for 10 days and is not subsequently resumed her suppressive antibiotics.  She continues to use probiotics, topical estrogen cream, and cranberry tablets but recently ran out of this supplement.  She questions some of her hygiene practices today as she is not able to get in the bathtub and bathes herself using a "birdbath" technique.  She did try Myrbetriq 50 mg samples but stopped it as soon as she found out she had a UTI.  She is not resume this medication.  She is unsure whether this helped or not.  On her 06/01/2017 visit, she complained of frequency, urgency, nocturia, incontinence and some vaginal burning.  Patient denied any gross hematuria, dysuria or suprapubic/flank pain.  Patient denied any fevers, chills, nausea or vomiting.  She did admit to not drinking a lot of water and she was not taking probiotics.  She stated that she restarted her Myrbetriq on Sunday.  CATH UA was positive for 0-5 WBC's and RBC's.  ***  PMH: Past Medical History:  Diagnosis Date   Anxiety    Cancer ([place])    melanoma   Cancer ([facility])    squamous cell   Endometriosis    GERD (gastroesophageal reflux disease)    History of stomach ulcers     Kidney failure    Recurrent UTI (urinary tract infection)    Rheumatoid arthritis ([facility])     Surgical History: Past Surgical History:  Procedure Laterality Date   ABDOMINAL HYSTERECTOMY     APPENDECTOMY     CATARACT EXTRACTION     CHOLECYSTECTOMY     COLON SURGERY     ESOPHAGOGASTRODUODENOSCOPY (EGD) WITH PROPOFOL N/A 03/15/2016   Procedure: ESOPHAGOGASTRODUODENOSCOPY (EGD) WITH PROPOFOL;  Surgeon: Manya Silvas, MD;  Location: [facility];  Service: Endoscopy;  Laterality: N/A;   INNER EAR SURGERY     wears hearing aide   TONSILLECTOMY      Home Medications:  Allergies as of 06/06/2018      Reactions   Ciprofloxacin Hcl Rash   Codeine Sulfate    Other reaction(s): Unknown   Doxycycline Hyclate Rash   Penicillin G Rash   Sulfamethoxazole-trimethoprim Rash      Medication List       Accurate as of June 04, 2018  3:59 PM. Always use your most recent med list.        conjugated estrogens vaginal cream Commonly known as:  Premarin Place 1 Applicatorful vaginally daily. Apply 0.5mg  (pea-sized amount)  just inside the vaginal introitus with a finger-tip every night for two weeks and then Monday, Wednesday and Friday nights.   Iron (Ferrous Gluconate) 256 (28 Fe) MG Tabs Take 1 tablet by mouth daily.   Myrbetriq 50 MG Tb24 tablet Generic drug:  mirabegron ER TAKE ONE (1) TABLET  BY MOUTH ONCE DAILY   nitrofurantoin (macrocrystal-monohydrate) 100 MG capsule Commonly known as:  MACROBID Take 1 capsule (100 mg total) by mouth 2 (two) times daily.   nitrofurantoin (macrocrystal-monohydrate) 100 MG capsule Commonly known as:  MACROBID Take 1 capsule (100 mg total) by mouth every 12 (twelve) hours.   omeprazole 40 MG capsule Commonly known as:  PRILOSEC Take 1 capsule by mouth 2 (two) times daily. Take 30 minutes before breakfast and take 30 minutes before dinner.   sertraline 50 MG tablet Commonly known as:  ZOLOFT   VITAMIN C PO Take by  mouth.   VITAMIN D3 PO Take by mouth.       Allergies:  Allergies  Allergen Reactions   Ciprofloxacin Hcl Rash   Codeine Sulfate     Other reaction(s): Unknown   Doxycycline Hyclate Rash   Penicillin G Rash   Sulfamethoxazole-Trimethoprim Rash    Family History: Family History  Problem Relation Age of Onset   CVA Father    Breast cancer Cousin        mat cousin   Prostate cancer Neg Hx    Kidney cancer Neg Hx    Kidney disease Neg Hx    Bladder Cancer Neg Hx     Social History:  reports that she has never smoked. She has never used smokeless tobacco. She reports that she does not drink alcohol or use drugs.  ROS:                                        Physical Exam: There were no vitals taken for this visit.  Constitutional: Well nourished. Alert and oriented, No acute distress. {HEENT: [place] AT, moist mucus membranes.  Trachea midline, no masses.} Cardiovascular: No clubbing, cyanosis, or edema. Respiratory: Normal respiratory effort, no increased work of breathing. {GI: Abdomen is soft, non tender, non distended, no abdominal masses. Liver and spleen not palpable.  No hernias appreciated.  Stool sample for occult testing is not indicated.} {GU: No CVA tenderness.  No bladder fullness or masses.  *** external genitalia, *** pubic hair distribution, no lesions.  Normal urethral meatus, no lesions, no prolapse, no discharge.   No urethral masses, tenderness and/or tenderness. No bladder fullness, tenderness or masses. *** vagina mucosa, *** estrogen effect, no discharge, no lesions, *** pelvic support, *** cystocele and *** rectocele noted.  No cervical motion tenderness.  Uterus is freely mobile and non-fixed.  No adnexal/parametria masses or tenderness noted.  Anus and perineum are without rashes or lesions.   *** } Skin: No rashes, bruises or suspicious lesions. {Lymph: No cervical or inguinal adenopathy.} Neurologic: Grossly intact, no  focal deficits, moving all 4 extremities. Psychiatric: Normal mood and affect.   Laboratory Data: Lab Results  Component Value Date   WBC 12.9 (H) 02/05/2016   HGB 11.3 (L) 02/05/2016   HCT 36.2 02/05/2016   MCV 73.8 (L) 02/05/2016   PLT 424 02/05/2016    Lab Results  Component Value Date   CREATININE 0.92 08/25/2016    No results found for: PSA  No results found for: TESTOSTERONE  No results found for: HGBA1C  Lab Results  Component Value Date   TSH 2.130 06/17/2015    No results found for: CHOL, HDL, CHOLHDL, VLDL, LDLCALC  Lab Results  Component Value Date   AST 17 01/12/2016   Lab Results  Component Value Date  ALT 12 (L) 01/12/2016   No components found for: ALKALINEPHOPHATASE No components found for: BILIRUBINTOTAL  No results found for: ESTRADIOL  Urinalysis    Component Value Date/Time   COLORURINE STRAW (A) 01/18/2018 [place] 01/18/2018 1555   APPEARANCEUR Cloudy (A) 08/07/2017 0850   LABSPEC <1.005 (L) 01/18/2018 1555   PHURINE 6.5 01/18/2018 1555   GLUCOSEU NEGATIVE 01/18/2018 1555   HGBUR TRACE (A) 01/18/2018 [place] 01/18/2018 1555   BILIRUBINUR Negative 08/07/2017 0850   KETONESUR NEGATIVE 01/18/2018 1555   PROTEINUR NEGATIVE 01/18/2018 1555   NITRITE NEGATIVE 01/18/2018 1555   LEUKOCYTESUR NEGATIVE 01/18/2018 1555   LEUKOCYTESUR 1+ (A) 08/07/2017 0850    I have reviewed the labs.  Assessment & Plan:    1. Recurrent UTI *** - UA today reassuring - Urine culture to rule out any occult infection *** - Reviewed/educated about recurrent UTIs *** - Reviewed symptoms of UTIs and if she develops symptoms, would recommend catheterized specimen only  *** - Discussion today about antibiotic stewardship and other prevention techniques including continued use of topical estrogen, cranberry tabs, vitamin C, probiotics treatment strategy *** - Hygiene techniques also discussed today- suggested  Hibiclens  2. Vaginal atrophy *** - Any topical estrogen cream at least 3 times per week  3. Mixed incontinence *** - Continue Myrbetriq 50 mg; refills given  4. Multiple drug allergies *** - Discussion today about concern that she has multiple antibiotic drug allergies with increased in variable bacterial resistance patterns *** - In the future, this may be problematic *** - I have recommended consideration of allergy testing/referral to allergist to explore this further to ensure that she is truly allergic to these medications in order to be able to treat possible further urinary tract infections down the road *** - She will let us know in the future if she like to be referred to an allergist for this purpose  Heidi Arias, [place] [address] [place], [place] [address] [phone]  I, Adele Schilder, am acting as a Education administrator for [employer], PA-C.   {Add Scribe Attestation Statement}

## 2018-06-06 ENCOUNTER — Ambulatory Visit: Payer: PPO | Admitting: Urology

## 2018-06-13 DIAGNOSIS — H7012 Chronic mastoiditis, left ear: Secondary | ICD-10-CM | POA: Diagnosis not present

## 2018-06-13 DIAGNOSIS — H6120 Impacted cerumen, unspecified ear: Secondary | ICD-10-CM | POA: Diagnosis not present

## 2018-06-13 DIAGNOSIS — H7011 Chronic mastoiditis, right ear: Secondary | ICD-10-CM | POA: Diagnosis not present

## 2018-06-25 ENCOUNTER — Ambulatory Visit: Payer: PPO | Admitting: Urology

## 2018-06-28 ENCOUNTER — Ambulatory Visit: Payer: PPO | Admitting: Urology

## 2018-10-04 DIAGNOSIS — K219 Gastro-esophageal reflux disease without esophagitis: Secondary | ICD-10-CM | POA: Diagnosis not present

## 2018-10-04 DIAGNOSIS — D509 Iron deficiency anemia, unspecified: Secondary | ICD-10-CM | POA: Diagnosis not present

## 2018-10-04 DIAGNOSIS — E1122 Type 2 diabetes mellitus with diabetic chronic kidney disease: Secondary | ICD-10-CM | POA: Diagnosis not present

## 2018-10-04 DIAGNOSIS — Z79899 Other long term (current) drug therapy: Secondary | ICD-10-CM | POA: Diagnosis not present

## 2018-10-04 DIAGNOSIS — F419 Anxiety disorder, unspecified: Secondary | ICD-10-CM | POA: Diagnosis not present

## 2018-10-04 DIAGNOSIS — N183 Chronic kidney disease, stage 3 (moderate): Secondary | ICD-10-CM | POA: Diagnosis not present

## 2018-10-10 DIAGNOSIS — Z859 Personal history of malignant neoplasm, unspecified: Secondary | ICD-10-CM | POA: Diagnosis not present

## 2018-10-10 DIAGNOSIS — Z872 Personal history of diseases of the skin and subcutaneous tissue: Secondary | ICD-10-CM | POA: Diagnosis not present

## 2018-10-10 DIAGNOSIS — L570 Actinic keratosis: Secondary | ICD-10-CM | POA: Diagnosis not present

## 2018-10-10 DIAGNOSIS — D485 Neoplasm of uncertain behavior of skin: Secondary | ICD-10-CM | POA: Diagnosis not present

## 2018-10-10 DIAGNOSIS — L578 Other skin changes due to chronic exposure to nonionizing radiation: Secondary | ICD-10-CM | POA: Diagnosis not present

## 2018-10-10 DIAGNOSIS — Z85820 Personal history of malignant melanoma of skin: Secondary | ICD-10-CM | POA: Diagnosis not present

## 2018-10-10 DIAGNOSIS — C4441 Basal cell carcinoma of skin of scalp and neck: Secondary | ICD-10-CM | POA: Diagnosis not present

## 2018-12-04 ENCOUNTER — Other Ambulatory Visit: Payer: Self-pay | Admitting: Urology

## 2018-12-09 ENCOUNTER — Ambulatory Visit (INDEPENDENT_AMBULATORY_CARE_PROVIDER_SITE_OTHER): Payer: PPO | Admitting: Physician Assistant

## 2018-12-09 ENCOUNTER — Encounter: Payer: Self-pay | Admitting: Physician Assistant

## 2018-12-09 ENCOUNTER — Other Ambulatory Visit: Payer: Self-pay

## 2018-12-09 VITALS — BP 130/67 | HR 71 | Ht 63.0 in | Wt 135.6 lb

## 2018-12-09 DIAGNOSIS — R300 Dysuria: Secondary | ICD-10-CM

## 2018-12-09 DIAGNOSIS — Z87440 Personal history of urinary (tract) infections: Secondary | ICD-10-CM

## 2018-12-09 LAB — URINALYSIS, COMPLETE
Bilirubin, UA: NEGATIVE
Glucose, UA: NEGATIVE
Ketones, UA: NEGATIVE
Nitrite, UA: POSITIVE — AB
Protein,UA: NEGATIVE
Specific Gravity, UA: 1.025 (ref 1.005–1.030)
Urobilinogen, Ur: 0.2 mg/dL (ref 0.2–1.0)
pH, UA: 5.5 (ref 5.0–7.5)

## 2018-12-09 LAB — MICROSCOPIC EXAMINATION
Epithelial Cells (non renal): NONE SEEN /hpf (ref 0–10)
RBC: NONE SEEN /hpf (ref 0–2)
WBC, UA: >30 /hpf — AB (ref 0–5)

## 2018-12-09 MED ORDER — NITROFURANTOIN MONOHYD MACRO 100 MG PO CAPS: 100.0000 mg | ORAL_CAPSULE | Freq: Two times a day (BID) | ORAL | 0 refills | Status: AC

## 2018-12-09 NOTE — Progress Notes (Signed)
12/09/2018 8:21 AM   Heidi Arias February 19, 1932 [id number]  CC: Dysuria, lower abdominal pain  HPI: Heidi Arias is a 85 y.o. female who presents today for evaluation of possible UTI. She is an established [facility] patient who last saw Dr. Erlene Quan on 01/04/2018 for dysuria, urgency, and frequency.  She reports a 2-3 week history of dysuria and lower abdominal pain. She denies fever, chills, nausea, vomiting, flank pain, and gross hematuria.  She does have a history of recurrent UTI, most recently in October 2019 and treated with nitrofurantoin. She is not currently taking prophylactic antibiotics. She does take probiotics, topical estrogen cream, and cranberry tablets for UTI prevention.  In-office UA with a catheterized sample today positive for 1+ blood, nitrites, and 1+ leukocyte esterase; urine microscopy with >30 WBCs/HPF and many bacteria.  She reports allergies to Bactrim, penicillin G, and ciprofloxacin.  PMH: Past Medical History:  Diagnosis Date  . Anxiety   . Cancer ([place])    melanoma  . Cancer (HCC)    squamous cell  . Endometriosis   . GERD (gastroesophageal reflux disease)   . History of stomach ulcers   . Kidney failure   . Recurrent UTI (urinary tract infection)   . Rheumatoid arthritis [facility])     Surgical History: Past Surgical History:  Procedure Laterality Date  . ABDOMINAL HYSTERECTOMY    . APPENDECTOMY    . CATARACT EXTRACTION    . CHOLECYSTECTOMY    . COLON SURGERY    . ESOPHAGOGASTRODUODENOSCOPY (EGD) WITH PROPOFOL N/A 03/15/2016   Procedure: ESOPHAGOGASTRODUODENOSCOPY (EGD) WITH PROPOFOL;  Surgeon: Manya Silvas, MD;  Location: [facility] ENDOSCOPY;  Service: Endoscopy;  Laterality: N/A;  . [place]     wears hearing aide  . TONSILLECTOMY      Home Medications:  Allergies as of 12/09/2018      Reactions   Ciprofloxacin Hcl Rash   Codeine Sulfate    Other reaction(s): Unknown   Doxycycline Hyclate Rash   Penicillin G Rash    Sulfamethoxazole-trimethoprim Rash      Medication List       Accurate as of December 09, 2018 11:59 PM. If you have any questions, ask your nurse or doctor.        AZO URINARY PAIN PO Take by mouth as needed.   conjugated estrogens vaginal cream Commonly known as: Premarin Place 1 Applicatorful vaginally daily. Apply 0.5mg  (pea-sized amount)  just inside the vaginal introitus with a finger-tip every night for two weeks and then Monday, Wednesday and Friday nights.   Iron (Ferrous Gluconate) 256 (28 Fe) MG Tabs Take 1 tablet by mouth daily.   mupirocin ointment 2 % Commonly known as: BACTROBAN   Myrbetriq 50 MG Tb24 tablet Generic drug: mirabegron ER TAKE ONE (1) TABLET BY MOUTH ONCE DAILY   nitrofurantoin (macrocrystal-monohydrate) 100 MG capsule Commonly known as: MACROBID Take 1 capsule (100 mg total) by mouth every 12 (twelve) hours for 5 days. What changed: Another medication with the same name was removed. Continue taking this medication, and follow the directions you see here. Changed by: Debroah Loop, PA-C   omeprazole 40 MG capsule Commonly known as: PRILOSEC Take 1 capsule by mouth 2 (two) times daily. Take 30 minutes before breakfast and take 30 minutes before dinner.   sertraline 50 MG tablet Commonly known as: ZOLOFT   VITAMIN C PO Take by mouth.   VITAMIN D3 PO Take by mouth.      Allergies:  Allergies  Allergen  Reactions  . Ciprofloxacin Hcl Rash  . Codeine Sulfate     Other reaction(s): Unknown  . Doxycycline Hyclate Rash  . Penicillin G Rash  . Sulfamethoxazole-Trimethoprim Rash   Family History: Family History  Problem Relation Age of Onset  . CVA Father   . Breast cancer Cousin        mat cousin  . Prostate cancer Neg Hx   . Kidney cancer Neg Hx   . Kidney disease Neg Hx   . Bladder Cancer Neg Hx    Social History:   reports that she has never smoked. She has never used smokeless tobacco. She reports that she does  not drink alcohol or use drugs.  ROS: UROLOGY Frequent Urination?: No Hard to postpone urination?: No Burning/pain with urination?: No Get up at night to urinate?: Yes Leakage of urine?: Yes Urine stream starts and stops?: No Trouble starting stream?: No Do you have to strain to urinate?: No Blood in urine?: No Urinary tract infection?: Yes Sexually transmitted disease?: No Injury to kidneys or bladder?: No Painful intercourse?: No Weak stream?: No Currently pregnant?: No Vaginal bleeding?: No Last menstrual period?: N/A  Gastrointestinal Nausea?: No Vomiting?: No Indigestion/heartburn?: No Diarrhea?: No Constipation?: No  Constitutional Fever: No Night sweats?: No Weight loss?: No Fatigue?: No  Skin Skin rash/lesions?: No Itching?: No  Eyes Blurred vision?: No Double vision?: No  Ears/Nose/Throat Sore throat?: No Sinus problems?: No  Hematologic/Lymphatic Swollen glands?: No Easy bruising?: No  Cardiovascular Leg swelling?: No Chest pain?: No  Respiratory Cough?: No Shortness of breath?: No  Endocrine Excessive thirst?: No  Musculoskeletal Back pain?: No Joint pain?: No  Neurological Headaches?: No Dizziness?: No  Psychologic Depression?: No Anxiety?: No  Physical Exam: BP 130/67 (BP Location: Left Arm, Patient Position: Sitting, Cuff Size: Normal)   Pulse 71   Ht 5\' 3"  (1.6 m)   Wt 135 lb 9.6 oz (61.5 kg)   BMI 24.02 kg/m   Constitutional:  Alert and oriented, no acute distress, nontoxic appearing HEENT: [place], AT Cardiovascular: No clubbing, cyanosis, or edema Respiratory: Normal respiratory effort, no increased work of breathing Skin: No rashes, bruises or suspicious lesions Neurologic: Grossly intact, no focal deficits, moving all 4 extremities, ambulating with rolling cart Psychiatric: Normal mood and affect  Laboratory Data: Results for orders placed or performed in visit on 12/09/18  Microscopic Examination   URINE   Result Value Ref Range   WBC, UA >30 (A) 0 - 5 /hpf   RBC None seen 0 - 2 /hpf   Epithelial Cells (non renal) None seen 0 - 10 /hpf   Bacteria, UA Many (A) None seen/Few  Urinalysis, Complete  Result Value Ref Range   Specific Gravity, UA 1.025 1.005 - 1.030   pH, UA 5.5 5.0 - 7.5   Color, UA Yellow Yellow   Appearance Ur Cloudy (A) Clear   Leukocytes,UA 1+ (A) Negative   Protein,UA Negative Negative/Trace   Glucose, UA Negative Negative   Ketones, UA Negative Negative   RBC, UA 1+ (A) Negative   Bilirubin, UA Negative Negative   Urobilinogen, Ur 0.2 0.2 - 1.0 mg/dL   Nitrite, UA Positive (A) Negative   Microscopic Examination See below:    Assessment & Plan:   1. Dysuria Patient with symptoms and UA consistent with acute cystitis without hematuria. Will send for culture. No upper tract symptoms, will prescribe nitrofurantoin today.  - Urinalysis, Complete - CULTURE, URINE COMPREHENSIVE - nitrofurantoin, macrocrystal-monohydrate, (MACROBID) 100 MG capsule; Take 1  capsule (100 mg total) by mouth every 12 (twelve) hours for 5 days.  Dispense: 10li capsule; Refill: 0  Debroah Loop, PA-C  [place] [address], [place], [place] [address] [phone]

## 2018-12-09 NOTE — Progress Notes (Signed)
In and Out Catheterization  Patient is present today for a I & O catheterization due to rUTI. Patient was cleaned and prepped in a sterile fashion with betadine.  A 14FR cath was inserted no complications were noted , 49ml of urine return was noted, urine was cloudy and yellow in color with foul odor. A clean urine sample was collected for u/a and culture. Bladder was drained  And catheter was removed with out difficulty.    Preformed by: Gordy Clement, [place]

## 2018-12-10 ENCOUNTER — Ambulatory Visit: Payer: PPO | Admitting: Physician Assistant

## 2018-12-12 DIAGNOSIS — H903 Sensorineural hearing loss, bilateral: Secondary | ICD-10-CM | POA: Diagnosis not present

## 2018-12-12 DIAGNOSIS — H7011 Chronic mastoiditis, right ear: Secondary | ICD-10-CM | POA: Diagnosis not present

## 2018-12-13 ENCOUNTER — Telehealth: Payer: Self-pay | Admitting: Physician Assistant

## 2018-12-13 ENCOUNTER — Other Ambulatory Visit: Payer: Self-pay | Admitting: Physician Assistant

## 2018-12-13 LAB — CULTURE, URINE COMPREHENSIVE

## 2018-12-13 MED ORDER — PREMARIN 0.625 MG/GM VA CREA: 1.0000 | TOPICAL_CREAM | Freq: Every day | VAGINAL | 12 refills | Status: AC

## 2018-12-13 MED ORDER — CEFDINIR 300 MG PO CAPS: 300.0000 mg | ORAL_CAPSULE | Freq: Two times a day (BID) | ORAL | 0 refills | Status: AC

## 2018-12-13 NOTE — Telephone Encounter (Signed)
I just spoke with Heidi Arias over the phone.  She reports her mom states that her dysuria has improved slightly, however it is still persisting.  She is also noting an increase in her urinary incontinence.  I have reordered her Premarin cream.  Heidi Arias reports that her mom is stating she is having some flank pain.  I will switch Heidi Arias's antibiotics to Omnicef 300 mg BID x5 days to complete treatment for her current infection.  As this is a third generation cephalosporin, I do not expect cross-reactivity with patient's known penicillin allergy.  I advised Heidi Arias to call the office again next week if her mother continues to not improve on this new antibiotic.  She expressed understanding.

## 2018-12-13 NOTE — Telephone Encounter (Signed)
Heidi Arias called to let us know pt isn't doing any better and will be out of Macrobid today.  She would also like a refill of Premarin Cream sent to [facility] in [place].  Pt saw Sam this past Monday.

## 2018-12-14 ENCOUNTER — Other Ambulatory Visit: Payer: Self-pay | Admitting: Urology

## 2019-01-02 DIAGNOSIS — C4441 Basal cell carcinoma of skin of scalp and neck: Secondary | ICD-10-CM | POA: Diagnosis not present

## 2019-03-07 DIAGNOSIS — C4441 Basal cell carcinoma of skin of scalp and neck: Secondary | ICD-10-CM | POA: Diagnosis not present

## 2019-04-11 DIAGNOSIS — F419 Anxiety disorder, unspecified: Secondary | ICD-10-CM | POA: Diagnosis not present

## 2019-04-11 DIAGNOSIS — N3941 Urge incontinence: Secondary | ICD-10-CM | POA: Diagnosis not present

## 2019-04-11 DIAGNOSIS — N390 Urinary tract infection, site not specified: Secondary | ICD-10-CM | POA: Diagnosis not present

## 2019-04-11 DIAGNOSIS — K219 Gastro-esophageal reflux disease without esophagitis: Secondary | ICD-10-CM | POA: Diagnosis not present

## 2019-04-11 DIAGNOSIS — D509 Iron deficiency anemia, unspecified: Secondary | ICD-10-CM | POA: Diagnosis not present

## 2019-04-11 DIAGNOSIS — N1831 Chronic kidney disease, stage 3a: Secondary | ICD-10-CM | POA: Diagnosis not present

## 2019-04-11 DIAGNOSIS — Z79899 Other long term (current) drug therapy: Secondary | ICD-10-CM | POA: Diagnosis not present

## 2019-04-11 DIAGNOSIS — Z23 Encounter for immunization: Secondary | ICD-10-CM | POA: Diagnosis not present

## 2019-04-11 DIAGNOSIS — Z0000 Encounter for general adult medical examination without abnormal findings: Secondary | ICD-10-CM | POA: Diagnosis not present

## 2019-04-11 DIAGNOSIS — E1121 Type 2 diabetes mellitus with diabetic nephropathy: Secondary | ICD-10-CM | POA: Diagnosis not present

## 2019-04-17 DIAGNOSIS — L570 Actinic keratosis: Secondary | ICD-10-CM | POA: Diagnosis not present

## 2019-04-17 DIAGNOSIS — L578 Other skin changes due to chronic exposure to nonionizing radiation: Secondary | ICD-10-CM | POA: Diagnosis not present

## 2019-04-17 DIAGNOSIS — Z859 Personal history of malignant neoplasm, unspecified: Secondary | ICD-10-CM | POA: Diagnosis not present

## 2019-04-17 DIAGNOSIS — Z85828 Personal history of other malignant neoplasm of skin: Secondary | ICD-10-CM | POA: Diagnosis not present

## 2019-04-17 DIAGNOSIS — Z872 Personal history of diseases of the skin and subcutaneous tissue: Secondary | ICD-10-CM | POA: Diagnosis not present

## 2019-05-20 DIAGNOSIS — M79674 Pain in right toe(s): Secondary | ICD-10-CM | POA: Diagnosis not present

## 2019-05-20 DIAGNOSIS — I7300 Raynaud's syndrome without gangrene: Secondary | ICD-10-CM | POA: Diagnosis not present

## 2019-05-20 DIAGNOSIS — B351 Tinea unguium: Secondary | ICD-10-CM | POA: Diagnosis not present

## 2019-05-20 DIAGNOSIS — M79675 Pain in left toe(s): Secondary | ICD-10-CM | POA: Diagnosis not present

## 2019-06-12 DIAGNOSIS — H906 Mixed conductive and sensorineural hearing loss, bilateral: Secondary | ICD-10-CM | POA: Diagnosis not present

## 2019-06-12 DIAGNOSIS — H7012 Chronic mastoiditis, left ear: Secondary | ICD-10-CM | POA: Diagnosis not present

## 2019-06-12 DIAGNOSIS — H7011 Chronic mastoiditis, right ear: Secondary | ICD-10-CM | POA: Diagnosis not present

## 2019-07-17 ENCOUNTER — Other Ambulatory Visit: Payer: Self-pay | Admitting: Urology

## 2019-07-21 DIAGNOSIS — M65342 Trigger finger, left ring finger: Secondary | ICD-10-CM | POA: Diagnosis not present

## 2019-08-26 DIAGNOSIS — M79675 Pain in left toe(s): Secondary | ICD-10-CM | POA: Diagnosis not present

## 2019-08-26 DIAGNOSIS — B351 Tinea unguium: Secondary | ICD-10-CM | POA: Diagnosis not present

## 2019-08-26 DIAGNOSIS — M79674 Pain in right toe(s): Secondary | ICD-10-CM | POA: Diagnosis not present

## 2019-10-14 DIAGNOSIS — R300 Dysuria: Secondary | ICD-10-CM | POA: Diagnosis not present

## 2019-10-14 DIAGNOSIS — K219 Gastro-esophageal reflux disease without esophagitis: Secondary | ICD-10-CM | POA: Diagnosis not present

## 2019-10-14 DIAGNOSIS — E1122 Type 2 diabetes mellitus with diabetic chronic kidney disease: Secondary | ICD-10-CM | POA: Diagnosis not present

## 2019-10-14 DIAGNOSIS — N1831 Chronic kidney disease, stage 3a: Secondary | ICD-10-CM | POA: Diagnosis not present

## 2019-10-14 DIAGNOSIS — N390 Urinary tract infection, site not specified: Secondary | ICD-10-CM | POA: Diagnosis not present

## 2019-10-14 DIAGNOSIS — D509 Iron deficiency anemia, unspecified: Secondary | ICD-10-CM | POA: Diagnosis not present

## 2019-10-14 DIAGNOSIS — F419 Anxiety disorder, unspecified: Secondary | ICD-10-CM | POA: Diagnosis not present

## 2019-10-14 DIAGNOSIS — Z79899 Other long term (current) drug therapy: Secondary | ICD-10-CM | POA: Diagnosis not present

## 2019-11-03 DIAGNOSIS — N390 Urinary tract infection, site not specified: Secondary | ICD-10-CM | POA: Diagnosis not present

## 2019-11-03 DIAGNOSIS — Z87440 Personal history of urinary (tract) infections: Secondary | ICD-10-CM | POA: Diagnosis not present

## 2019-11-03 DIAGNOSIS — R399 Unspecified symptoms and signs involving the genitourinary system: Secondary | ICD-10-CM | POA: Diagnosis not present

## 2019-11-13 ENCOUNTER — Other Ambulatory Visit: Payer: Self-pay | Admitting: *Deleted

## 2019-11-13 DIAGNOSIS — Z87440 Personal history of urinary (tract) infections: Secondary | ICD-10-CM

## 2019-11-13 NOTE — Progress Notes (Signed)
11/14/2019 10:08 AM   [name] [birth year] [id number]  Referring provider: Sallee Lange, NP [address] [place],  [place] [address] Chief Complaint  Patient presents with  . Follow-up    uti    HPI: Heidi Arias is a 86 y.o. female who returns for follow up of frequent UTIs. She is accompanied by her daughter.   Previous CTU from 2018 was negative and cysto showed wide mouth diverticulum and mild trabeculation otherwise normal.   She does have a history of recurrent UTI, in October 2019 she was treated with nitrofurantoin. She was not currently taking prophylactic antibiotics. She did take probiotics, topical estrogen cream, and cranberry tablets for UTI prevention.  Patient was last seen by Debroah Loop, PA-C on 12/09/2018.  Patient is noted to have 3 infections in the last year with only two being documented. She was treated with 2 courses of antibiotics Macrobid and  fosfomycin. UA from 10/14/2019 collected on routine labs patient was asymptomatic but treated dispite to lack of symptoms.  Today she reports dysuria and pressure just above her pelvic bone as her only . Denies burning and itching. Symptoms of dysuria started 1 week ago. He daughter noted she had a low grade fever of 99.3. Denies any recent fevers.   She continues to use Myrbetriq 50 mg and topical vaginal estrogen cream. Her daughter notes she drinks cranberry juice.   (+) Urine culture: 12/09/2018: Klebsiella pneumoniae  11/03/2019: Klebsiella pneumoniae    Routine UAs drawn by PCP: 04/11/2019: Nitrite positive, microscopic hematuria, many bacteria 10/14/2019: Nitrite positive, microscopic hematuria, 10-50 WBC, 4-10 RBC, many bacteria, moderate squamous epithelial cells 11/03/2019: Nitrite positive, microscopic hematuria, 4-10 WBC, many bacteria    PMH: Past Medical History:  Diagnosis Date  . Anxiety   . Cancer ([place])    melanoma  . Cancer (HCC)    squamous cell  .  Endometriosis   . GERD (gastroesophageal reflux disease)   . History of stomach ulcers   . Kidney failure   . Recurrent UTI (urinary tract infection)   . Rheumatoid arthritis [facility])     Surgical History: Past Surgical History:  Procedure Laterality Date  . ABDOMINAL HYSTERECTOMY    . APPENDECTOMY    . CATARACT EXTRACTION    . CHOLECYSTECTOMY    . COLON SURGERY    . ESOPHAGOGASTRODUODENOSCOPY (EGD) WITH PROPOFOL N/A 03/15/2016   Procedure: ESOPHAGOGASTRODUODENOSCOPY (EGD) WITH PROPOFOL;  Surgeon: Manya Silvas, MD;  Location: [facility] ENDOSCOPY;  Service: Endoscopy;  Laterality: N/A;  . [place]     wears hearing aide  . TONSILLECTOMY      Home Medications:  Allergies as of 11/14/2019      Reactions   Ciprofloxacin Hcl Rash   Codeine Sulfate    Other reaction(s): Unknown   Doxycycline Hyclate Rash   Penicillin G Rash   Sulfamethoxazole-trimethoprim Rash      Medication List       Accurate as of November 14, 2019 10:08 AM. If you have any questions, ask your nurse or doctor.        AZO URINARY PAIN PO Take by mouth as needed.   Iron (Ferrous Gluconate) 256 (28 Fe) MG Tabs Take 1 tablet by mouth daily.   mupirocin ointment 2 % Commonly known as: BACTROBAN   Myrbetriq 50 MG Tb24 tablet Generic drug: mirabegron ER TAKE (1) TABLET BY MOUTH EVERY DAY   omeprazole 40 MG capsule Commonly known as: PRILOSEC Take 1 capsule by  mouth 2 (two) times daily. Take 30 minutes before breakfast and take 30 minutes before dinner.   Premarin vaginal cream Generic drug: conjugated estrogens Place 1 Applicatorful vaginally daily. Apply 0.5mg  (pea-sized amount)  just inside the vaginal introitus with a finger-tip on Monday, Wednesday and Friday nights.   sertraline 50 MG tablet Commonly known as: ZOLOFT   VITAMIN C PO Take by mouth.   VITAMIN D3 PO Take by mouth.       Allergies:  Allergies  Allergen Reactions  . Ciprofloxacin Hcl Rash  . Codeine Sulfate      Other reaction(s): Unknown  . Doxycycline Hyclate Rash  . Penicillin G Rash  . Sulfamethoxazole-Trimethoprim Rash    Family History: Family History  Problem Relation Age of Onset  . CVA Father   . Breast cancer Cousin        mat cousin  . Prostate cancer Neg Hx   . Kidney cancer Neg Hx   . Kidney disease Neg Hx   . Bladder Cancer Neg Hx     Social History:  reports that she has never smoked. She has never used smokeless tobacco. She reports that she does not drink alcohol and does not use drugs.   Physical Exam: BP 130/62   Pulse 70   Ht 5\' 1"  (1.549 m)   Wt 131 lb (59.4 kg)   BMI 24.75 kg/m   Constitutional:  Alert and oriented, No acute distress.  Accompanied by daughter today. HEENT: [place] AT, moist mucus membranes.  Trachea midline, no masses. Cardiovascular: No clubbing, cyanosis, or edema. Respiratory: Normal respiratory effort, no increased work of breathing. Skin: No rashes, bruises or suspicious lesions. Neurologic: Grossly intact, no focal deficits, moving all 4 extremities. Psychiatric: Normal mood and affect.   Urinalysis Results for orders placed or performed during the hospital encounter of 11/14/19  Urinalysis, Complete w Microscopic  Result Value Ref Range   Color, Urine YELLOW YELLOW   APPearance CLOUDY (A) CLEAR   Specific Gravity, Urine 1.025 1.005 - 1.030   pH 5.5 5.0 - 8.0   Glucose, UA NEGATIVE NEGATIVE mg/dL   Hgb urine dipstick SMALL (A) NEGATIVE   Bilirubin Urine NEGATIVE NEGATIVE   Ketones, ur NEGATIVE NEGATIVE mg/dL   Protein, ur NEGATIVE NEGATIVE mg/dL   Nitrite POSITIVE (A) NEGATIVE   Leukocytes,Ua LARGE (A) NEGATIVE   Squamous Epithelial / LPF 6-10 0 - 5   WBC, UA >50 0 - 5 WBC/hpf   RBC / HPF 6-10 0 - 5 RBC/hpf   Bacteria, UA MANY (A) NONE SEEN   Ca Oxalate Crys, UA PRESENT     Assessment & Plan:    1. rUTI Patient is noted to have 3 probable infections in the last calendar year.  UA today is suspicious but contaminated.I  strongly urged the patient not to allow to be treated if she is not having any symptoms, this may have contributed to her subsequent infections.   I did review warning symptoms including overt dysuria, fevers, worsening of her symptoms, etc.  If she experiences any of these, she will call us and we will treat her.  Counseled pt on prevention techniques including d' mannose, probiotics, cranberry tablets and estrogen cream (pea sized amount) 3x a week on urethra tube at night time   Will repeat RUS especially in the setting of calcium oxalate crystals present in UA.    2. Mixed incontinence   Continue Myrbetriq 50 mg   Adequate emptying. PVR is 0 mL.  Follow up with 2 weeks for recheck of symptoms, catheterized UA, f/u renal ultrasound   [facility] [address] [place], [place] [address] [phone]  I, Selena Batten, am acting as a scribe for Dr. Hollice Espy.  I have reviewed the above documentation for accuracy and completeness, and I agree with the above.   Hollice Espy, MD

## 2019-11-14 ENCOUNTER — Encounter: Payer: Self-pay | Admitting: Urology

## 2019-11-14 ENCOUNTER — Other Ambulatory Visit
Admission: RE | Admit: 2019-11-14 | Discharge: 2019-11-14 | Disposition: A | Discharge status: Home / Self Care | Payer: PPO | Attending: Urology | Admitting: Urology

## 2019-11-14 ENCOUNTER — Other Ambulatory Visit: Payer: Self-pay

## 2019-11-14 ENCOUNTER — Ambulatory Visit: Payer: PPO | Admitting: Urology

## 2019-11-14 VITALS — BP 130/62 | HR 70 | Ht 61.0 in | Wt 131.0 lb

## 2019-11-14 DIAGNOSIS — N390 Urinary tract infection, site not specified: Secondary | ICD-10-CM

## 2019-11-14 DIAGNOSIS — N3946 Mixed incontinence: Secondary | ICD-10-CM | POA: Diagnosis not present

## 2019-11-14 DIAGNOSIS — Z87440 Personal history of urinary (tract) infections: Secondary | ICD-10-CM | POA: Diagnosis not present

## 2019-11-14 LAB — URINALYSIS, COMPLETE (UACMP) WITH MICROSCOPIC
Bilirubin Urine: NEGATIVE
Glucose, UA: NEGATIVE mg/dL
Ketones, ur: NEGATIVE mg/dL
Nitrite: POSITIVE — AB
Protein, ur: NEGATIVE mg/dL
Specific Gravity, Urine: 1.025 (ref 1.005–1.030)
WBC, UA: >50 WBC/hpf (ref 0–5)
pH: 5.5 (ref 5.0–8.0)

## 2019-11-14 LAB — BLADDER SCAN AMB NON-IMAGING

## 2019-11-27 ENCOUNTER — Other Ambulatory Visit: Payer: Self-pay

## 2019-11-27 ENCOUNTER — Ambulatory Visit
Admission: RE | Admit: 2019-11-27 | Discharge: 2019-11-27 | Disposition: A | Discharge status: Home / Self Care | Payer: PPO | Source: Ambulatory Visit | Attending: Urology | Admitting: Urology

## 2019-11-27 DIAGNOSIS — N390 Urinary tract infection, site not specified: Secondary | ICD-10-CM | POA: Insufficient documentation

## 2019-11-27 NOTE — Progress Notes (Signed)
11/28/2019 9:08 AM   [name] 02-21-1932 [id number]  Referring provider: Sallee Lange, NP [address] [place],  [place] [address] Chief Complaint  Patient presents with  . Recurrent UTI    HPI: Heidi Arias is a 86 y.o. female presents today for a 2 week follow up of rUTI and mixed incontinence. The patient is accompanied by her daughter today. She presents today for a catheterized specimen.  Previous CTU from 2018 was negative and cysto showed wide mouth diverticulum and mild trabeculation otherwise normal.   Heidi Arias a history of recurrent UTI, in October 2019she was treated withnitrofurantoin.Shewas not currently taking prophylactic antibiotics. She has resumed probiotics, topical estrogen cream, and cranberry tablets for UTI prevention.  RUS on 11/27/2019 noted unremarkable renal ultrasound.  Today she is feeling alright. Dysuria is improving with minimal pain. Her bladder symptoms are improving.    Since last visit, she is improving despite not being treated with antibiotics, suprapubic pain has resolved.  No dysuria today..   She is taking Myrbetriq 50 mg.   PMH: Past Medical History:  Diagnosis Date  . Anxiety   . Cancer ([place])    melanoma  . Cancer (HCC)    squamous cell  . Endometriosis   . GERD (gastroesophageal reflux disease)   . History of stomach ulcers   . Kidney failure   . Recurrent UTI (urinary tract infection)   . Rheumatoid arthritis [facility])     Surgical History: Past Surgical History:  Procedure Laterality Date  . ABDOMINAL HYSTERECTOMY    . APPENDECTOMY    . CATARACT EXTRACTION    . CHOLECYSTECTOMY    . COLON SURGERY    . ESOPHAGOGASTRODUODENOSCOPY (EGD) WITH PROPOFOL N/A 03/15/2016   Procedure: ESOPHAGOGASTRODUODENOSCOPY (EGD) WITH PROPOFOL;  Surgeon: Heidi Silvas, MD;  Location: [facility] ENDOSCOPY;  Service: Endoscopy;  Laterality: N/A;  . [place]     wears hearing aide  . TONSILLECTOMY        Home Medications:  Allergies as of 11/28/2019      Reactions   Ciprofloxacin Hcl Rash   Codeine Sulfate    Other reaction(s): Unknown   Doxycycline Hyclate Rash   Penicillin G Rash   Sulfamethoxazole-trimethoprim Rash      Medication List       Accurate as of November 28, 2019 11:59 PM. If you have any questions, ask your nurse or doctor.        AZO URINARY PAIN PO Take by mouth as needed.   Iron (Ferrous Gluconate) 256 (28 Fe) MG Tabs Take 1 tablet by mouth daily.   mupirocin ointment 2 % Commonly known as: BACTROBAN   Myrbetriq 50 MG Tb24 tablet Generic drug: mirabegron ER TAKE (1) TABLET BY MOUTH EVERY DAY   omeprazole 40 MG capsule Commonly known as: PRILOSEC Take 1 capsule by mouth 2 (two) times daily. Take 30 minutes before breakfast and take 30 minutes before dinner.   Premarin vaginal cream Generic drug: conjugated estrogens Place 1 Applicatorful vaginally daily. Apply 0.5mg  (pea-sized amount)  just inside the vaginal introitus with a finger-tip on Monday, Wednesday and Friday nights.   sertraline 50 MG tablet Commonly known as: ZOLOFT   VITAMIN C PO Take by mouth.   VITAMIN D3 PO Take by mouth.       Allergies:  Allergies  Allergen Reactions  . Ciprofloxacin Hcl Rash  . Codeine Sulfate     Other reaction(s): Unknown  . Doxycycline Hyclate Rash  . Penicillin  G Rash  . Sulfamethoxazole-Trimethoprim Rash    Family History: Family History  Problem Relation Age of Onset  . CVA Father   . Breast cancer Cousin        mat cousin  . Prostate cancer Neg Hx   . Kidney cancer Neg Hx   . Kidney disease Neg Hx   . Bladder Cancer Neg Hx     Social History:  reports that she has never smoked. She has never used smokeless tobacco. She reports that she does not drink alcohol and does not use drugs.   Physical Exam: BP (!) 152/67   Pulse 64   Ht 5\' 1"  (1.549 m)   Wt 130 lb (59 kg)   BMI 24.56 kg/m   Constitutional:  Alert and oriented, No  acute distress. HEENT: [place] AT, moist mucus membranes.  Trachea midline, no masses. Cardiovascular: No clubbing, cyanosis, or edema. Respiratory: Normal respiratory effort, no increased work of breathing. Skin: No rashes, bruises or suspicious lesions. Neurologic: Grossly intact, no focal deficits, moving all 4 extremities. Psychiatric: Normal mood and affect.   Urinalysis Component     Latest Ref Rng & Units 11/28/2019  Color, Urine     YELLOW YELLOW  Appearance     CLEAR HAZY (A)  Specific Gravity, Urine     1.005 - 1.030 1.010  pH     5.0 - 8.0 6.0  Glucose, UA     NEGATIVE mg/dL NEGATIVE  Hgb urine dipstick     NEGATIVE TRACE (A)  Bilirubin Urine     NEGATIVE NEGATIVE  Ketones, ur     NEGATIVE mg/dL NEGATIVE  Protein     NEGATIVE mg/dL NEGATIVE  Nitrite     NEGATIVE POSITIVE (A)  Leukocytes,Ua     NEGATIVE MODERATE (A)  Squamous Epithelial / LPF     0 - 5 0-5  WBC, UA     0 - 5 WBC/hpf >50  RBC / HPF     0 - 5 RBC/hpf 0-5  Bacteria, UA     NONE SEEN MANY (A)  WBC Clumps      PRESENT    Pertinent Imaging: CLINICAL DATA:  86 year old with recurrent UTI.  EXAM: RENAL / URINARY TRACT ULTRASOUND COMPLETE  COMPARISON:  Abdominal CT 08/25/2016  FINDINGS: Right Kidney:  Renal measurements: 8.3 x 3.7 x 3.8 cm = volume: 62 mL. Echogenicity within normal limits. Subcentimeter cortical lesion on prior CT is not visualized by ultrasound. No mass or hydronephrosis visualized. No perinephric collection.  Left Kidney:  Renal measurements: 8.4 x 3.9 x 3.3 cm = volume: 55 mL. Echogenicity within normal limits. No mass or hydronephrosis visualized. No perinephric collection.  Bladder:  Appears normal for degree of bladder distention.  Other:  None.  IMPRESSION: Unremarkable renal ultrasound.   Electronically Signed   By: Keith Rake M.D.   On: 11/27/2019 16:47  I have personally reviewed the images and agree with radiologist  interpretation.     Assessment & Plan:    1. rUTI -Patient is noted to have 3 probable infections in the last calendar year, unclear whether or not she was truly symptomatic each time -UA today is consistent with chronic bacterial colonization.  There is no blood but she does have many bacteria and is nitrite positive with many WBCs.  She is asymptomatic thus will not treat. -Encouraged patient to push fluids.  -Counseled pt on prevention techniques including d' mannose, probiotics, cranberry tablets and estrogen cream (peasizedamount) 3x a week  on urethra tube at night time -If she does become symptomatic, encouraged to come to the office for catheterized specimen.  2. Mixed incontinence  -Continue Myrbetriq 50 mg.    Follow up in 1 year for symptom recheck.    [facility] [address], [place], [place] [address] [phone]  I, Selena Batten, am acting as a scribe for Dr. Hollice Espy.  I have reviewed the above documentation for accuracy and completeness, and I agree with the above.   Hollice Espy, MD

## 2019-11-28 ENCOUNTER — Ambulatory Visit (INDEPENDENT_AMBULATORY_CARE_PROVIDER_SITE_OTHER): Payer: PPO | Admitting: Urology

## 2019-11-28 ENCOUNTER — Other Ambulatory Visit
Admission: RE | Admit: 2019-11-28 | Discharge: 2019-11-28 | Disposition: A | Discharge status: Home / Self Care | Payer: PPO | Source: Ambulatory Visit | Attending: Urology | Admitting: Urology

## 2019-11-28 ENCOUNTER — Encounter: Payer: Self-pay | Admitting: Urology

## 2019-11-28 VITALS — BP 152/67 | HR 64 | Ht 61.0 in | Wt 130.0 lb

## 2019-11-28 DIAGNOSIS — N390 Urinary tract infection, site not specified: Secondary | ICD-10-CM | POA: Insufficient documentation

## 2019-11-28 LAB — URINALYSIS, COMPLETE (UACMP) WITH MICROSCOPIC
Bilirubin Urine: NEGATIVE
Glucose, UA: NEGATIVE mg/dL
Ketones, ur: NEGATIVE mg/dL
Nitrite: POSITIVE — AB
Protein, ur: NEGATIVE mg/dL
Specific Gravity, Urine: 1.01 (ref 1.005–1.030)
WBC, UA: >50 WBC/hpf (ref 0–5)
pH: 6 (ref 5.0–8.0)

## 2019-11-28 NOTE — Progress Notes (Signed)
In and Out Catheterization  Patient is present today for a I & O catheterization due to recurrent UTI. Patient was cleaned and prepped in a sterile fashion with betadine . A 14FR cath was inserted no complications were noted , 239ml of urine return was noted, urine was clear yellow in color. A clean urine sample was collected for UA. Bladder was drained  And catheter was removed with out difficulty.    Preformed by: Fonnie Jarvis, CMA

## 2019-12-02 DIAGNOSIS — M79675 Pain in left toe(s): Secondary | ICD-10-CM | POA: Diagnosis not present

## 2019-12-02 DIAGNOSIS — M79674 Pain in right toe(s): Secondary | ICD-10-CM | POA: Diagnosis not present

## 2019-12-02 DIAGNOSIS — B351 Tinea unguium: Secondary | ICD-10-CM | POA: Diagnosis not present

## 2019-12-04 DIAGNOSIS — H6121 Impacted cerumen, right ear: Secondary | ICD-10-CM | POA: Diagnosis not present

## 2019-12-04 DIAGNOSIS — H906 Mixed conductive and sensorineural hearing loss, bilateral: Secondary | ICD-10-CM | POA: Diagnosis not present

## 2019-12-04 DIAGNOSIS — H7012 Chronic mastoiditis, left ear: Secondary | ICD-10-CM | POA: Diagnosis not present

## 2020-03-09 DIAGNOSIS — M79674 Pain in right toe(s): Secondary | ICD-10-CM | POA: Diagnosis not present

## 2020-03-09 DIAGNOSIS — M79675 Pain in left toe(s): Secondary | ICD-10-CM | POA: Diagnosis not present

## 2020-03-09 DIAGNOSIS — B351 Tinea unguium: Secondary | ICD-10-CM | POA: Diagnosis not present

## 2020-03-09 DIAGNOSIS — E119 Type 2 diabetes mellitus without complications: Secondary | ICD-10-CM | POA: Diagnosis not present

## 2020-04-22 DIAGNOSIS — L578 Other skin changes due to chronic exposure to nonionizing radiation: Secondary | ICD-10-CM | POA: Diagnosis not present

## 2020-04-22 DIAGNOSIS — Z85820 Personal history of malignant melanoma of skin: Secondary | ICD-10-CM | POA: Diagnosis not present

## 2020-04-22 DIAGNOSIS — Z85828 Personal history of other malignant neoplasm of skin: Secondary | ICD-10-CM | POA: Diagnosis not present

## 2020-04-22 DIAGNOSIS — Z872 Personal history of diseases of the skin and subcutaneous tissue: Secondary | ICD-10-CM | POA: Diagnosis not present

## 2020-04-22 DIAGNOSIS — L853 Xerosis cutis: Secondary | ICD-10-CM | POA: Diagnosis not present

## 2020-04-22 DIAGNOSIS — L570 Actinic keratosis: Secondary | ICD-10-CM | POA: Diagnosis not present

## 2020-04-22 DIAGNOSIS — Z859 Personal history of malignant neoplasm, unspecified: Secondary | ICD-10-CM | POA: Diagnosis not present

## 2020-04-23 DIAGNOSIS — K219 Gastro-esophageal reflux disease without esophagitis: Secondary | ICD-10-CM | POA: Diagnosis not present

## 2020-04-23 DIAGNOSIS — F419 Anxiety disorder, unspecified: Secondary | ICD-10-CM | POA: Diagnosis not present

## 2020-04-23 DIAGNOSIS — R300 Dysuria: Secondary | ICD-10-CM | POA: Diagnosis not present

## 2020-04-23 DIAGNOSIS — Z23 Encounter for immunization: Secondary | ICD-10-CM | POA: Diagnosis not present

## 2020-04-23 DIAGNOSIS — N1831 Chronic kidney disease, stage 3a: Secondary | ICD-10-CM | POA: Diagnosis not present

## 2020-04-23 DIAGNOSIS — E1122 Type 2 diabetes mellitus with diabetic chronic kidney disease: Secondary | ICD-10-CM | POA: Diagnosis not present

## 2020-04-23 DIAGNOSIS — Z0000 Encounter for general adult medical examination without abnormal findings: Secondary | ICD-10-CM | POA: Diagnosis not present

## 2020-04-23 DIAGNOSIS — Z79899 Other long term (current) drug therapy: Secondary | ICD-10-CM | POA: Diagnosis not present

## 2020-04-23 DIAGNOSIS — D509 Iron deficiency anemia, unspecified: Secondary | ICD-10-CM | POA: Diagnosis not present

## 2020-05-04 DIAGNOSIS — R309 Painful micturition, unspecified: Secondary | ICD-10-CM | POA: Diagnosis not present

## 2020-05-04 DIAGNOSIS — R109 Unspecified abdominal pain: Secondary | ICD-10-CM | POA: Diagnosis not present

## 2020-06-03 DIAGNOSIS — H7012 Chronic mastoiditis, left ear: Secondary | ICD-10-CM | POA: Diagnosis not present

## 2020-06-03 DIAGNOSIS — H903 Sensorineural hearing loss, bilateral: Secondary | ICD-10-CM | POA: Diagnosis not present

## 2020-06-04 DIAGNOSIS — H43813 Vitreous degeneration, bilateral: Secondary | ICD-10-CM | POA: Diagnosis not present

## 2020-06-10 DIAGNOSIS — M79674 Pain in right toe(s): Secondary | ICD-10-CM | POA: Diagnosis not present

## 2020-06-10 DIAGNOSIS — M79675 Pain in left toe(s): Secondary | ICD-10-CM | POA: Diagnosis not present

## 2020-06-10 DIAGNOSIS — B351 Tinea unguium: Secondary | ICD-10-CM | POA: Diagnosis not present

## 2020-09-13 ENCOUNTER — Other Ambulatory Visit: Payer: Self-pay

## 2020-09-13 ENCOUNTER — Other Ambulatory Visit: Payer: Self-pay | Admitting: Family Medicine

## 2020-09-13 ENCOUNTER — Encounter: Payer: Self-pay | Admitting: Urology

## 2020-09-13 ENCOUNTER — Other Ambulatory Visit
Admission: RE | Admit: 2020-09-13 | Discharge: 2020-09-13 | Disposition: A | Discharge status: Home / Self Care | Payer: PPO | Source: Ambulatory Visit | Attending: Urology | Admitting: Urology

## 2020-09-13 ENCOUNTER — Ambulatory Visit: Payer: PPO | Admitting: Urology

## 2020-09-13 VITALS — BP 123/72 | HR 71 | Ht 62.0 in | Wt 135.0 lb

## 2020-09-13 DIAGNOSIS — N390 Urinary tract infection, site not specified: Secondary | ICD-10-CM | POA: Diagnosis not present

## 2020-09-13 DIAGNOSIS — N3946 Mixed incontinence: Secondary | ICD-10-CM

## 2020-09-13 DIAGNOSIS — N952 Postmenopausal atrophic vaginitis: Secondary | ICD-10-CM

## 2020-09-13 DIAGNOSIS — R300 Dysuria: Secondary | ICD-10-CM | POA: Diagnosis not present

## 2020-09-13 LAB — URINALYSIS, COMPLETE (UACMP) WITH MICROSCOPIC
Bilirubin Urine: NEGATIVE
Glucose, UA: NEGATIVE mg/dL
Ketones, ur: NEGATIVE mg/dL
Nitrite: NEGATIVE
Protein, ur: NEGATIVE mg/dL
Specific Gravity, Urine: 1.015 (ref 1.005–1.030)
WBC, UA: >50 WBC/hpf (ref 0–5)
pH: 5.5 (ref 5.0–8.0)

## 2020-09-13 MED ORDER — NITROFURANTOIN MONOHYD MACRO 100 MG PO CAPS: 100.0000 mg | ORAL_CAPSULE | Freq: Two times a day (BID) | ORAL | 0 refills | Status: DC

## 2020-09-13 NOTE — Progress Notes (Signed)
09/13/2020 3:12 PM   Heidi Arias Date 11-12-1931 [id number]  Referring provider: Sallee Lange, NP [address] [place],  [place] [address]   Urological history: 1. rUTI's -contributing factors of age, vaginal atrophy, diarrhea, incontinence and poor liquid intake  -documented positive urine cultures over the last year  Klebsiella pneumoniae resistant to ampicillin 11/03/2019  E.coli pan-sensitive 04/23/2020  Klebsiella pneumoniae resistant to ampicillin 05/04/2020 -RUS 2021 NED  2. High risk hematuria -non-smoker -CTU 2018  -cysto 2018 Mild trabeculation with a large wide mouth diverticulum involving the majority of the right lateral wall -urine cytology 2018 negative -UA > 50 WBC's, 6-10 RBC's, many bacteria and WBC clumps presents.    3. Mixed incontinence -contributing factors of age, vaginal atrophy, pelvic surgery and depression -managed with Myrbetriq 50 mg daily   4. Vaginal atrophy -managed with vaginal estrogen cream three nights weekly  Chief Complaint  Patient presents with   Dysuria    HPI: Heidi Arias is 87 y.o. female who presents today for dysuria with her daughter, Heidi Arias.    She started having suprapubic with voiding, malodorous urine, increase in nocturia and malaise for one week.    Patient denies any modifying or aggravating factors.  Patient denies any gross hematuria, dysuria or suprapubic/flank pain.  Patient denies any fevers, chills, nausea or vomiting.    CATH UA > 50 WBC's, 6-10 RBC's, many bacteria and WBC clumps presents.  PMH: Past Medical History:  Diagnosis Date   Anxiety    Cancer ([facility])    melanoma   Cancer ([facility])    squamous cell   Endometriosis    GERD (gastroesophageal reflux disease)    History of stomach ulcers    Kidney failure    Recurrent UTI (urinary tract infection)    Rheumatoid arthritis ([facility])     Surgical History: Past Surgical History:  Procedure Laterality Date   ABDOMINAL  HYSTERECTOMY     APPENDECTOMY     CATARACT EXTRACTION     CHOLECYSTECTOMY     COLON SURGERY     ESOPHAGOGASTRODUODENOSCOPY (EGD) WITH PROPOFOL N/A 03/15/2016   Procedure: ESOPHAGOGASTRODUODENOSCOPY (EGD) WITH PROPOFOL;  Surgeon: Manya Silvas, MD;  Location: [facility];  Service: Endoscopy;  Laterality: N/A;   INNER EAR SURGERY     wears hearing aide   TONSILLECTOMY      Home Medications:  Allergies as of 09/13/2020       Reactions   Ciprofloxacin Hcl Rash   Codeine Sulfate    Other reaction(s): Unknown   Doxycycline Hyclate Rash   Penicillin G Rash   Sulfamethoxazole-trimethoprim Rash        Medication List        Accurate as of September 13, 2020  3:12 PM. If you have any questions, ask your nurse or doctor.          AZO URINARY PAIN PO Take by mouth as needed.   Iron (Ferrous Gluconate) 256 (28 Fe) MG Tabs Take 1 tablet by mouth daily.   mupirocin ointment 2 % Commonly known as: BACTROBAN   Myrbetriq 50 MG Tb24 tablet Generic drug: mirabegron ER TAKE (1) TABLET BY MOUTH EVERY DAY   nitrofurantoin (macrocrystal-monohydrate) 100 MG capsule Commonly known as: MACROBID Take 1 capsule (100 mg total) by mouth every 12 (twelve) hours. Started by: [name], PA-C   omeprazole 40 MG capsule Commonly known as: PRILOSEC Take 1 capsule by mouth 2 (two) times daily. Take 30 minutes before breakfast and take 30  minutes before dinner.   Premarin vaginal cream Generic drug: conjugated estrogens Place 1 Applicatorful vaginally daily. Apply 0.77m (pea-sized amount)  just inside the vaginal introitus with a finger-tip on Monday, Wednesday and Friday nights.   sertraline 50 MG tablet Commonly known as: ZOLOFT   VITAMIN C PO Take by mouth.   VITAMIN D3 PO Take by mouth.        Allergies:  Allergies  Allergen Reactions   Ciprofloxacin Hcl Rash   Codeine Sulfate     Other reaction(s): Unknown   Doxycycline Hyclate Rash   Penicillin G Rash    Sulfamethoxazole-Trimethoprim Rash    Family History: Family History  Problem Relation Age of Onset   CVA Father    Breast cancer Cousin        mat cousin   Prostate cancer Neg Hx    Kidney cancer Neg Hx    Kidney disease Neg Hx    Bladder Cancer Neg Hx     Social History:  reports that she has never smoked. She has never used smokeless tobacco. She reports that she does not drink alcohol and does not use drugs.  ROS: Pertinent ROS in HPI  Physical Exam: BP 123/72   Pulse 71   Ht _0  (1.575 m)   Wt 135 lb (61.2 kg)   BMI 24.69 kg/m   Constitutional:  Well nourished. Alert and oriented, No acute distress. HEENT: [place] AT, mask in place.  Trachea midline Cardiovascular: No clubbing, cyanosis, or edema. Respiratory: Normal respiratory effort, no increased work of breathing. Neurologic: Grossly intact, no focal deficits, moving all 4 extremities. Psychiatric: Normal mood and affect.    Laboratory Data: WBC (White Blood Cell Count) 4.1 - 10.2 10^3/uL 7.0   RBC (Red Blood Cell Count) 4.04 - 5.48 10^6/uL 4.03 Low    Hemoglobin 12.0 - 15.0 gm/dL 11.0 Low    Hematocrit 35.0 - 47.0 % 34.7 Low    MCV (Mean Corpuscular Volume) 80.0 - 100.0 fl 86.1   MCH (Mean Corpuscular Hemoglobin) 27.0 - 31.2 pg 27.3   MCHC (Mean Corpuscular Hemoglobin Concentration) 32.0 - 36.0 gm/dL 31.7 Low    Platelet Count 150 - 450 10^3/uL 296   RDW-CV (Red Cell Distribution Width) 11.6 - 14.8 % 12.5   MPV (Mean Platelet Volume) 9.4 - 12.4 fl 9.2 Low    Neutrophils 1.50 - 7.80 10^3/uL 3.80   Lymphocytes 1.00 - 3.60 10^3/uL 2.30   Mixed Count 0.10 - 0.90 10^3/uL 0.90   Neutrophil % 32.0 - 70.0 % 54.9   Lymphocyte % 10.0 - 50.0 % 32.7   Mixed % 3.0 - 14.4 % 12.4   Resulting Agency  [facility]  Specimen Collected: 04/23/20 16:04 Last Resulted: 04/23/20 16:13  Received From: [facility] Result Received: 09/13/20 09:36   Glucose 70 - 110 mg/dL 90   Sodium 136 - 145  mmol/L 141   Potassium 3.6 - 5.1 mmol/L 4.3   Chloride 97 - 109 mmol/L 108   Carbon Dioxide (CO2) 22.0 - 32.0 mmol/L 27.1   Urea Nitrogen (BUN) 7 - 25 mg/dL 19   Creatinine 0.6 - 1.1 mg/dL 0.9   Glomerular Filtration Rate (eGFR), MDRD Estimate >60 mL/min/1.73sq m 59 Low    Calcium 8.7 - 10.3 mg/dL 9.5   AST  8 - 39 U/L 11   ALT  5 - 38 U/L 7   Alk Phos (alkaline Phosphatase) 34 - 104 U/L 74   Albumin 3.5 -  4.8 g/dL 4.0   Bilirubin, Total 0.3 - 1.2 mg/dL 0.3   Protein, Total 6.1 - 7.9 g/dL 6.7   A/G Ratio 1.0 - 5.0 gm/dL 1.5   Resulting [facility]  Specimen Collected: 04/23/20 16:04 Last Resulted: 04/26/20 13:00  Received From: [facility]  Result Received: 09/13/20 09:36     Hemoglobin A1C 4.2 - 5.6 % 6.1 High    Average Blood Glucose (Calc) mg/dL 128   Resulting Agency  [facility]   Narrative Performed by [facility] Normal Range:    4.2 - 5.6%  Increased Risk:  5.7 - 6.4%  Diabetes:        >= 6.5%  Glycemic Control for adults with diabetes:  <7%   Specimen Collected: 04/23/20 16:04 Last Resulted: 04/26/20 14:34  Received From: [facility]  Result Received: 09/13/20 09:36   Urinalysis Component     Latest Ref Rng & Units 09/13/2020  Color, Urine     YELLOW YELLOW  Appearance     CLEAR HAZY (A)  Specific Gravity, Urine     1.005 - 1.030 1.015  pH     5.0 - 8.0 5.5  Glucose, UA     NEGATIVE mg/dL NEGATIVE  Hgb urine dipstick     NEGATIVE TRACE (A)  Bilirubin Urine     NEGATIVE NEGATIVE  Ketones, ur     NEGATIVE mg/dL NEGATIVE  Protein     NEGATIVE mg/dL NEGATIVE  Nitrite     NEGATIVE NEGATIVE  Leukocytes,Ua     NEGATIVE MODERATE (A)  Squamous Epithelial / LPF     0 - 5 0-5  WBC, UA     0 - 5 WBC/hpf >50  RBC / HPF     0 - 5 RBC/hpf 6-10  Bacteria, UA     NONE SEEN MANY (A)  WBC Clumps      PRESENT  I have reviewed the labs.   Pertinent Imaging: No recent  imaging  Assessment & Plan:    1. Dysuria -UA grossly infected -urine sent for culture -start Macrobid 100 mg, twice daily for seven days  2. rUTI's - patient is instructed to increase their water intake until the urine is pale yellow or clear (10 to 12 cups daily)  - patient is instructed to take probiotics (yogurt, oral pills or vaginal suppositories), take cranberry pills or drink the juice, D-mannose and Vitamin C 1,000 mg daily to acidify the urine   3. Incontinence -continue Myrbetriq 50 mg daily   4. Vaginal atrophy -continue vaginal estrogen cream three nights weekly                                              Return for keep appointment with Dr. Erlene Quan .  These notes generated with voice recognition software. I apologize for typographical errors.  [name], PA-C  [facility] [address]  [place], [place] [address] [phone]

## 2020-09-13 NOTE — Progress Notes (Signed)
Patient ID: Heidi Arias, female   DOB: Aug 16, 1931, 87 y.o.   MRN: [id number] In and Out Catheterization  Patient is present today for a I & O catheterization due to dysuria. Patient was cleaned and prepped in a sterile fashion with betadine . A 14FR cath was inserted no complications were noted , 78ml of urine return was noted, urine was yellow in color. A clean urine sample was collected for UA and culture. Bladder was drained  And catheter was removed with out difficulty.    Preformed by: Edwin Dada, CMA

## 2020-09-16 LAB — URINE CULTURE: Culture: >=100000 — AB

## 2020-09-23 ENCOUNTER — Telehealth: Payer: Self-pay

## 2020-09-23 DIAGNOSIS — B351 Tinea unguium: Secondary | ICD-10-CM | POA: Diagnosis not present

## 2020-09-23 DIAGNOSIS — E119 Type 2 diabetes mellitus without complications: Secondary | ICD-10-CM | POA: Diagnosis not present

## 2020-09-23 NOTE — Telephone Encounter (Signed)
Pt's daughter called and LM stating she believe pt has a UTI. She is not any better since finishing Macrobid. Do you want pt to come back into office? Please advise.

## 2020-09-23 NOTE — Telephone Encounter (Signed)
Heidi Arias [phone]) called back.  Spoke to [employer]  She will review labs, med list and allergies and send meds to [facility] in [place] tomorrow.   Spoke to patients daughter to let her know.

## 2020-09-24 MED ORDER — CEFDINIR 300 MG PO CAPS: 300.0000 mg | ORAL_CAPSULE | Freq: Two times a day (BID) | ORAL | 0 refills | Status: AC

## 2020-09-24 NOTE — Telephone Encounter (Signed)
Lets send in a prescription for cefdinir 300 mg, 1 twice daily for 5 days for her.

## 2020-09-24 NOTE — Telephone Encounter (Signed)
Left detailed message on VM for daughter and rx sent to pharmacy

## 2020-10-05 ENCOUNTER — Other Ambulatory Visit: Payer: Self-pay | Admitting: Urology

## 2020-10-28 DIAGNOSIS — N1831 Chronic kidney disease, stage 3a: Secondary | ICD-10-CM | POA: Diagnosis not present

## 2020-10-28 DIAGNOSIS — R197 Diarrhea, unspecified: Secondary | ICD-10-CM | POA: Diagnosis not present

## 2020-10-28 DIAGNOSIS — D509 Iron deficiency anemia, unspecified: Secondary | ICD-10-CM | POA: Diagnosis not present

## 2020-10-28 DIAGNOSIS — E1122 Type 2 diabetes mellitus with diabetic chronic kidney disease: Secondary | ICD-10-CM | POA: Diagnosis not present

## 2020-10-28 DIAGNOSIS — L299 Pruritus, unspecified: Secondary | ICD-10-CM | POA: Diagnosis not present

## 2020-10-28 DIAGNOSIS — F419 Anxiety disorder, unspecified: Secondary | ICD-10-CM | POA: Diagnosis not present

## 2020-10-28 DIAGNOSIS — Z79899 Other long term (current) drug therapy: Secondary | ICD-10-CM | POA: Diagnosis not present

## 2020-10-28 DIAGNOSIS — K219 Gastro-esophageal reflux disease without esophagitis: Secondary | ICD-10-CM | POA: Diagnosis not present

## 2020-11-19 NOTE — Progress Notes (Signed)
11/22/2020 3:47 PM   Heidi Arias 28-Mar-1931 [id number]  Referring provider: Sallee Lange, NP [address] [place],  [place] [address]   Urological history: 1. rUTI's -contributing factors of age, vaginal atrophy, diarrhea, incontinence and poor liquid intake  -documented positive urine cultures over the last year  E.coli pan-sensitive 04/23/2020  Klebsiella pneumoniae resistant to ampicillin 05/04/2020  Klebsiella pneumoniae resistant to ampicillin 09/23/2020 -RUS 2021 NED  2. High risk hematuria -non-smoker -CTU 2018  -cysto 2018 Mild trabeculation with a large wide mouth diverticulum involving the majority of the right lateral wall -urine cytology 2018 negative -UA neg for micro heme   3. Mixed incontinence -contributing factors of age, vaginal atrophy, pelvic surgery and depression -managed with Myrbetriq 50 mg daily   4. Vaginal atrophy -managed with vaginal estrogen cream three nights weekly  Chief Complaint  Patient presents with   Follow-up    1 year follow-up     HPI: Heidi Arias is 87 y.o. female who presents today for dysuria with her daughter, [name].    She is experiencing urgency, dysuria, incontinence, LBP and suprapubic pain.  These symptoms have been going on for a few days.    Patient denies any modifying or aggravating factors.  Patient denies any gross hematuria or flank pain.  Patient denies any fevers, chills, nausea or vomiting.    UA nitrite positive, > 50 WBC's and many bacteria.    PVR 37 mL.   She is experiencing moderate urgency, 4-7 day time voids and 0-3 nocturia.  She engages in toilet mapping.    She is experiencing 8 or more incontinence episodes weekly.    PMH: Past Medical History:  Diagnosis Date   Anxiety    Cancer ([facility])    melanoma   Cancer ([facility])    squamous cell   Endometriosis    GERD (gastroesophageal reflux disease)    History of stomach ulcers    Kidney failure    Recurrent UTI  (urinary tract infection)    Rheumatoid arthritis ([facility])     Surgical History: Past Surgical History:  Procedure Laterality Date   ABDOMINAL HYSTERECTOMY     APPENDECTOMY     CATARACT EXTRACTION     CHOLECYSTECTOMY     COLON SURGERY     ESOPHAGOGASTRODUODENOSCOPY (EGD) WITH PROPOFOL N/A 03/15/2016   Procedure: ESOPHAGOGASTRODUODENOSCOPY (EGD) WITH PROPOFOL;  Surgeon: Manya Silvas, MD;  Location: [facility];  Service: Endoscopy;  Laterality: N/A;   INNER EAR SURGERY     wears hearing aide   TONSILLECTOMY      Home Medications:  Allergies as of 11/22/2020       Reactions   Ciprofloxacin Hcl Rash   Codeine Sulfate    Other reaction(s): Unknown   Doxycycline Hyclate Rash   Penicillin G Rash   Sulfamethoxazole-trimethoprim Rash        Medication List        Accurate as of November 22, 2020  3:47 PM. If you have any questions, ask your nurse or doctor.          STOP taking these medications    Iron (Ferrous Gluconate) 256 (28 Fe) MG Tabs Stopped by: Zyaire Dumas, PA-C       TAKE these medications    AZO URINARY PAIN PO Take by mouth as needed.   hydrOXYzine 25 MG tablet Commonly known as: ATARAX/VISTARIL Take 25 mg by mouth daily.   mupirocin ointment 2 % Commonly known as: [employer]  Myrbetriq 50 MG Tb24 tablet Generic drug: mirabegron ER TAKE (1) TABLET BY MOUTH EVERY DAY   nitrofurantoin (macrocrystal-monohydrate) 100 MG capsule Commonly known as: MACROBID Take 1 capsule (100 mg total) by mouth every 12 (twelve) hours.   omeprazole 40 MG capsule Commonly known as: PRILOSEC Take 1 capsule by mouth 2 (two) times daily. Take 30 minutes before breakfast and take 30 minutes before dinner.   Premarin vaginal cream Generic drug: conjugated estrogens [facility] 1 Applicatorful vaginally daily. Apply 0.76m (pea-sized amount)  just inside the vaginal introitus with a finger-tip on Monday, Wednesday and Friday nights.   sertraline 50 MG  tablet Commonly known as: ZOLOFT   VITAMIN C PO Take by mouth.   VITAMIN D3 PO Take by mouth.        Allergies:  Allergies  Allergen Reactions   Ciprofloxacin Hcl Rash   Codeine Sulfate     Other reaction(s): Unknown   Doxycycline Hyclate Rash   Penicillin G Rash   Sulfamethoxazole-Trimethoprim Rash    Family History: Family History  Problem Relation Age of Onset   CVA Father    Breast cancer Cousin        mat cousin   Prostate cancer Neg Hx    Kidney cancer Neg Hx    Kidney disease Neg Hx    Bladder Cancer Neg Hx     Social History:  reports that she has never smoked. She has never used smokeless tobacco. She reports that she does not drink alcohol and does not use drugs.  ROS: Pertinent ROS in HPI  Physical Exam: BP 135/69   Pulse 77   Ht 5' 2"  (1.575 m)   Wt 133 lb (60.3 kg)   BMI 24.33 kg/m   Constitutional:  Well nourished. Alert and oriented, No acute distress. HEENT: [place] AT, mask in place.  Trachea midline Cardiovascular: No clubbing, cyanosis, or edema. Respiratory: Normal respiratory effort, no increased work of breathing. Neurologic: Grossly intact, no focal deficits, moving all 4 extremities. Psychiatric: Normal mood and affect.    Laboratory Data: WBC (White Blood Cell Count) 4.1 - 10.2 10^3/uL 6.8   RBC (Red Blood Cell Count) 4.04 - 5.48 10^6/uL 3.89 Low    Hemoglobin 12.0 - 15.0 gm/dL 11.0 Low    Hematocrit 35.0 - 47.0 % 33.7 Low    MCV (Mean Corpuscular Volume) 80.0 - 100.0 fl 86.6   MCH (Mean Corpuscular Hemoglobin) 27.0 - 31.2 pg 28.3   MCHC (Mean Corpuscular Hemoglobin Concentration) 32.0 - 36.0 gm/dL 32.6   Platelet Count 150 - 450 10^3/uL 273   RDW-CV (Red Cell Distribution Width) 11.6 - 14.8 % 12.7   MPV (Mean Platelet Volume) 9.4 - 12.4 fl 10.2   Neutrophils 1.50 - 7.80 10^3/uL 4.00   Lymphocytes 1.00 - 3.60 10^3/uL 2.10   Mixed Count 0.10 - 0.90 10^3/uL 0.70   Neutrophil % 32.0 - 70.0 % 59.5   Lymphocyte % 10.0 - 50.0 % 30.4    Mixed % 3.0 - 14.4 % 10.1   Resulting Agency  [facility]  Specimen Collected: 10/28/20 15:45 Last Resulted: 10/28/20 16:14  Received From: [facility] Result Received: 11/19/20 20:48     Glucose 70 - 110 mg/dL 88   Sodium 136 - 145 mmol/L 139   Potassium 3.6 - 5.1 mmol/L 4.4   Chloride 97 - 109 mmol/L 107   Carbon Dioxide (CO2) 22.0 - 32.0 mmol/L 26.1   Urea Nitrogen (BUN) 7 - 25 mg/dL  19   Creatinine 0.6 - 1.1 mg/dL 1.1   Glomerular Filtration Rate (eGFR), MDRD Estimate >60 mL/min/1.73sq m 47 Low    Calcium 8.7 - 10.3 mg/dL 9.6   AST  8 - 39 U/L 12   ALT  5 - 38 U/L 7   Alk Phos (alkaline Phosphatase) 34 - 104 U/L 67   Albumin 3.5 - 4.8 g/dL 3.9   Bilirubin, Total 0.3 - 1.2 mg/dL 0.3   Protein, Total 6.1 - 7.9 g/dL 6.5   A/G Ratio 1.0 - 5.0 gm/dL 1.5   Resulting Agency  [facility]  Specimen Collected: 10/28/20 15:45 Last Resulted: 10/29/20 13:36  Received From: [facility]  Result Received: 11/19/20 20:48   Hemoglobin A1C 4.2 - 5.6 % 6.2 High    Average Blood Glucose (Calc) mg/dL 131   Resulting Agency  [facility] - [facility]  Narrative Performed by [facility] - [facility] Normal Range:    4.2 - 5.6%  Increased Risk:  5.7 - 6.4%  Diabetes:        >= 6.5%  Glycemic Control for adults with diabetes:  <7%   Specimen Collected: 10/28/20 15:45 Last Resulted: 10/29/20 14:29  Received From: [facility]  Result Received: 11/19/20 20:48     Urinalysis Component     Latest Ref Rng & Units 11/22/2020  Color, Urine     YELLOW YELLOW  Appearance     CLEAR CLOUDY (A)  Specific Gravity, Urine     1.005 - 1.030 1.020  pH     5.0 - 8.0 5.5  Glucose, UA     NEGATIVE mg/dL NEGATIVE  Hgb urine dipstick     NEGATIVE TRACE (A)  Bilirubin Urine     NEGATIVE NEGATIVE  Ketones, ur     NEGATIVE mg/dL NEGATIVE  Protein     NEGATIVE mg/dL NEGATIVE  Nitrite     NEGATIVE POSITIVE (A)   Leukocytes,Ua     NEGATIVE SMALL (A)  Squamous Epithelial / LPF     0 - 5 0-5  WBC, UA     0 - 5 WBC/hpf >50  RBC / HPF     0 - 5 RBC/hpf 0-5  Bacteria, UA     NONE SEEN MANY (A)  I have reviewed the labs.   Pertinent Imaging: Results for KALAYSIA, DEMONBREUN (MRN [id number]) as of 11/22/2020 22:47  Ref. Range 11/22/2020 15:23  Scan Result Unknown 1m    Assessment & Plan:    1. Dysuria -UA grossly infected -urine sent for culture -start Macrobid 100 mg, twice daily for seven days  2. rUTI's - patient is instructed to increase their water intake until the urine is pale yellow or clear (10 to 12 cups daily)  - patient is instructed to take probiotics (yogurt, oral pills or vaginal suppositories), take cranberry pills or drink the juice, D-mannose and Vitamin C 1,000 mg daily to acidify the urine  -we will start a suppressive antibiotic once urine culture results are available and she has completed her seven day course of a culture appropriate antibiotic -follow up in three months   3. Incontinence -continue Myrbetriq 50 mg daily  -reassess once UTI is resolved   4. Vaginal atrophy -continue vaginal estrogen cream three nights weekly  Return for pending urine culture results .  These notes generated with voice recognition software. I apologize for typographical errors.  [name], PA-C  [facility] [address]  [place], [place] [address] [phone]

## 2020-11-22 ENCOUNTER — Other Ambulatory Visit
Admission: RE | Admit: 2020-11-22 | Discharge: 2020-11-22 | Disposition: A | Discharge status: Home / Self Care | Payer: PPO | Attending: Urology | Admitting: Urology

## 2020-11-22 ENCOUNTER — Encounter: Payer: Self-pay | Admitting: Urology

## 2020-11-22 ENCOUNTER — Ambulatory Visit: Payer: PPO | Admitting: Urology

## 2020-11-22 ENCOUNTER — Other Ambulatory Visit: Payer: Self-pay

## 2020-11-22 ENCOUNTER — Other Ambulatory Visit: Payer: Self-pay | Admitting: *Deleted

## 2020-11-22 VITALS — BP 135/69 | HR 77 | Ht 62.0 in | Wt 133.0 lb

## 2020-11-22 DIAGNOSIS — N3946 Mixed incontinence: Secondary | ICD-10-CM

## 2020-11-22 DIAGNOSIS — N390 Urinary tract infection, site not specified: Secondary | ICD-10-CM

## 2020-11-22 DIAGNOSIS — N952 Postmenopausal atrophic vaginitis: Secondary | ICD-10-CM | POA: Insufficient documentation

## 2020-11-22 LAB — URINALYSIS, COMPLETE (UACMP) WITH MICROSCOPIC
Bilirubin Urine: NEGATIVE
Glucose, UA: NEGATIVE mg/dL
Ketones, ur: NEGATIVE mg/dL
Nitrite: POSITIVE — AB
Protein, ur: NEGATIVE mg/dL
Specific Gravity, Urine: 1.02 (ref 1.005–1.030)
WBC, UA: >50 WBC/hpf (ref 0–5)
pH: 5.5 (ref 5.0–8.0)

## 2020-11-22 LAB — BLADDER SCAN AMB NON-IMAGING

## 2020-11-22 MED ORDER — NITROFURANTOIN MONOHYD MACRO 100 MG PO CAPS: 100.0000 mg | ORAL_CAPSULE | Freq: Two times a day (BID) | ORAL | 0 refills | Status: DC

## 2020-11-26 ENCOUNTER — Telehealth: Payer: Self-pay | Admitting: Family Medicine

## 2020-11-26 LAB — URINE CULTURE: Culture: >=100000 — AB

## 2020-11-26 MED ORDER — CEFUROXIME AXETIL 500 MG PO TABS
500.0000 mg | ORAL_TABLET | Freq: Two times a day (BID) | ORAL | 0 refills | Status: DC
Start: 2020-11-26 — End: 2020-12-07

## 2020-11-26 NOTE — Telephone Encounter (Signed)
Patient's daughter notified, ABX has been sent to pharmacy. She will call our office when she has completed the ABX to let us know how she is feeling.

## 2020-11-26 NOTE — Telephone Encounter (Signed)
-----   Message from Nori Riis, PA-C sent at 11/26/2020  9:26 AM EDT ----- Please let her daughter, [name], know that her urine culture is positive for infection and we need to change her antibiotic to Ceftin 500 mg, twice daily for seven days.  I then want her to call us when she completes the antibiotic.  If she is feeling better, we can start Keflex 250 mg daily for suppressive antibiotics for 3 months.  If she does not feel better, we will need to cath her for a urine specimen.

## 2020-12-07 ENCOUNTER — Other Ambulatory Visit: Payer: Self-pay | Admitting: *Deleted

## 2020-12-07 DIAGNOSIS — N390 Urinary tract infection, site not specified: Secondary | ICD-10-CM

## 2020-12-07 MED ORDER — CEPHALEXIN 250 MG PO CAPS: 250.0000 mg | ORAL_CAPSULE | Freq: Every day | ORAL | 3 refills | Status: DC

## 2020-12-07 NOTE — Telephone Encounter (Signed)
Spoke with daughter and advised results. Appt scheduled

## 2020-12-07 NOTE — Telephone Encounter (Signed)
I have sent in the script for Keflex 250 mg daily.  I need to see her in three months for recheck.

## 2020-12-13 DIAGNOSIS — H903 Sensorineural hearing loss, bilateral: Secondary | ICD-10-CM | POA: Diagnosis not present

## 2020-12-13 DIAGNOSIS — H7012 Chronic mastoiditis, left ear: Secondary | ICD-10-CM | POA: Diagnosis not present

## 2020-12-29 DIAGNOSIS — M545 Low back pain, unspecified: Secondary | ICD-10-CM | POA: Diagnosis not present

## 2020-12-29 DIAGNOSIS — R262 Difficulty in walking, not elsewhere classified: Secondary | ICD-10-CM | POA: Diagnosis not present

## 2020-12-29 DIAGNOSIS — I129 Hypertensive chronic kidney disease with stage 1 through stage 4 chronic kidney disease, or unspecified chronic kidney disease: Secondary | ICD-10-CM | POA: Diagnosis not present

## 2020-12-29 DIAGNOSIS — Z888 Allergy status to other drugs, medicaments and biological substances status: Secondary | ICD-10-CM | POA: Diagnosis not present

## 2020-12-29 DIAGNOSIS — N189 Chronic kidney disease, unspecified: Secondary | ICD-10-CM | POA: Diagnosis not present

## 2020-12-29 DIAGNOSIS — R1110 Vomiting, unspecified: Secondary | ICD-10-CM | POA: Diagnosis not present

## 2020-12-29 DIAGNOSIS — Z602 Problems related to living alone: Secondary | ICD-10-CM | POA: Diagnosis not present

## 2020-12-29 DIAGNOSIS — E1122 Type 2 diabetes mellitus with diabetic chronic kidney disease: Secondary | ICD-10-CM | POA: Diagnosis not present

## 2020-12-29 DIAGNOSIS — H9190 Unspecified hearing loss, unspecified ear: Secondary | ICD-10-CM | POA: Diagnosis not present

## 2020-12-29 DIAGNOSIS — U071 COVID-19: Secondary | ICD-10-CM | POA: Diagnosis not present

## 2020-12-29 DIAGNOSIS — Q9381 Velo-cardio-facial syndrome: Secondary | ICD-10-CM | POA: Diagnosis not present

## 2020-12-29 DIAGNOSIS — N390 Urinary tract infection, site not specified: Secondary | ICD-10-CM | POA: Diagnosis not present

## 2020-12-29 DIAGNOSIS — R531 Weakness: Secondary | ICD-10-CM | POA: Diagnosis not present

## 2020-12-29 DIAGNOSIS — B965 Pseudomonas (aeruginosa) (mallei) (pseudomallei) as the cause of diseases classified elsewhere: Secondary | ICD-10-CM | POA: Diagnosis not present

## 2020-12-29 DIAGNOSIS — Z880 Allergy status to penicillin: Secondary | ICD-10-CM | POA: Diagnosis not present

## 2020-12-29 DIAGNOSIS — Z66 Do not resuscitate: Secondary | ICD-10-CM | POA: Diagnosis not present

## 2020-12-29 DIAGNOSIS — Z885 Allergy status to narcotic agent status: Secondary | ICD-10-CM | POA: Diagnosis not present

## 2020-12-29 DIAGNOSIS — Z20822 Contact with and (suspected) exposure to covid-19: Secondary | ICD-10-CM | POA: Diagnosis not present

## 2020-12-29 DIAGNOSIS — Z23 Encounter for immunization: Secondary | ICD-10-CM | POA: Diagnosis not present

## 2020-12-29 DIAGNOSIS — F419 Anxiety disorder, unspecified: Secondary | ICD-10-CM | POA: Diagnosis not present

## 2020-12-29 DIAGNOSIS — D539 Nutritional anemia, unspecified: Secondary | ICD-10-CM | POA: Diagnosis not present

## 2020-12-29 DIAGNOSIS — J028 Acute pharyngitis due to other specified organisms: Secondary | ICD-10-CM | POA: Diagnosis not present

## 2020-12-29 DIAGNOSIS — Z882 Allergy status to sulfonamides status: Secondary | ICD-10-CM | POA: Diagnosis not present

## 2020-12-29 DIAGNOSIS — R296 Repeated falls: Secondary | ICD-10-CM | POA: Diagnosis not present

## 2020-12-29 DIAGNOSIS — S0990XA Unspecified injury of head, initial encounter: Secondary | ICD-10-CM | POA: Diagnosis not present

## 2020-12-29 DIAGNOSIS — R519 Headache, unspecified: Secondary | ICD-10-CM | POA: Diagnosis not present

## 2020-12-29 DIAGNOSIS — R8271 Bacteriuria: Secondary | ICD-10-CM | POA: Diagnosis not present

## 2020-12-29 DIAGNOSIS — Z7409 Other reduced mobility: Secondary | ICD-10-CM | POA: Diagnosis not present

## 2020-12-29 DIAGNOSIS — D649 Anemia, unspecified: Secondary | ICD-10-CM | POA: Diagnosis not present

## 2020-12-30 DIAGNOSIS — R296 Repeated falls: Secondary | ICD-10-CM | POA: Diagnosis not present

## 2020-12-30 DIAGNOSIS — F419 Anxiety disorder, unspecified: Secondary | ICD-10-CM | POA: Diagnosis not present

## 2020-12-30 DIAGNOSIS — D649 Anemia, unspecified: Secondary | ICD-10-CM | POA: Diagnosis not present

## 2020-12-30 DIAGNOSIS — R8271 Bacteriuria: Secondary | ICD-10-CM | POA: Diagnosis not present

## 2020-12-31 DIAGNOSIS — D649 Anemia, unspecified: Secondary | ICD-10-CM | POA: Diagnosis not present

## 2020-12-31 DIAGNOSIS — R296 Repeated falls: Secondary | ICD-10-CM | POA: Diagnosis not present

## 2020-12-31 DIAGNOSIS — R8271 Bacteriuria: Secondary | ICD-10-CM | POA: Diagnosis not present

## 2020-12-31 DIAGNOSIS — N390 Urinary tract infection, site not specified: Secondary | ICD-10-CM | POA: Diagnosis not present

## 2020-12-31 DIAGNOSIS — F419 Anxiety disorder, unspecified: Secondary | ICD-10-CM | POA: Diagnosis not present

## 2021-01-01 DIAGNOSIS — R296 Repeated falls: Secondary | ICD-10-CM | POA: Diagnosis not present

## 2021-01-01 DIAGNOSIS — N390 Urinary tract infection, site not specified: Secondary | ICD-10-CM | POA: Diagnosis not present

## 2021-01-01 DIAGNOSIS — U071 COVID-19: Secondary | ICD-10-CM | POA: Diagnosis not present

## 2021-01-01 DIAGNOSIS — D649 Anemia, unspecified: Secondary | ICD-10-CM | POA: Diagnosis not present

## 2021-01-01 DIAGNOSIS — R8271 Bacteriuria: Secondary | ICD-10-CM | POA: Diagnosis not present

## 2021-01-02 DIAGNOSIS — D649 Anemia, unspecified: Secondary | ICD-10-CM | POA: Diagnosis not present

## 2021-01-02 DIAGNOSIS — R296 Repeated falls: Secondary | ICD-10-CM | POA: Diagnosis not present

## 2021-01-02 DIAGNOSIS — U071 COVID-19: Secondary | ICD-10-CM | POA: Diagnosis not present

## 2021-01-02 DIAGNOSIS — R8271 Bacteriuria: Secondary | ICD-10-CM | POA: Diagnosis not present

## 2021-01-03 DIAGNOSIS — R8271 Bacteriuria: Secondary | ICD-10-CM | POA: Diagnosis not present

## 2021-01-03 DIAGNOSIS — U071 COVID-19: Secondary | ICD-10-CM | POA: Diagnosis not present

## 2021-01-03 DIAGNOSIS — R296 Repeated falls: Secondary | ICD-10-CM | POA: Diagnosis not present

## 2021-01-03 DIAGNOSIS — F419 Anxiety disorder, unspecified: Secondary | ICD-10-CM | POA: Diagnosis not present

## 2021-01-03 DIAGNOSIS — D649 Anemia, unspecified: Secondary | ICD-10-CM | POA: Diagnosis not present

## 2021-01-04 DIAGNOSIS — R296 Repeated falls: Secondary | ICD-10-CM | POA: Diagnosis not present

## 2021-01-04 DIAGNOSIS — D649 Anemia, unspecified: Secondary | ICD-10-CM | POA: Diagnosis not present

## 2021-01-04 DIAGNOSIS — R8271 Bacteriuria: Secondary | ICD-10-CM | POA: Diagnosis not present

## 2021-01-14 DIAGNOSIS — G8929 Other chronic pain: Secondary | ICD-10-CM | POA: Diagnosis not present

## 2021-01-14 DIAGNOSIS — W19XXXD Unspecified fall, subsequent encounter: Secondary | ICD-10-CM | POA: Diagnosis not present

## 2021-01-14 DIAGNOSIS — Z09 Encounter for follow-up examination after completed treatment for conditions other than malignant neoplasm: Secondary | ICD-10-CM | POA: Diagnosis not present

## 2021-01-14 DIAGNOSIS — M545 Low back pain, unspecified: Secondary | ICD-10-CM | POA: Diagnosis not present

## 2021-01-14 DIAGNOSIS — Z79899 Other long term (current) drug therapy: Secondary | ICD-10-CM | POA: Diagnosis not present

## 2021-01-14 DIAGNOSIS — Z8616 Personal history of COVID-19: Secondary | ICD-10-CM | POA: Diagnosis not present

## 2021-01-14 DIAGNOSIS — R54 Age-related physical debility: Secondary | ICD-10-CM | POA: Diagnosis not present

## 2021-01-14 DIAGNOSIS — N390 Urinary tract infection, site not specified: Secondary | ICD-10-CM | POA: Diagnosis not present

## 2021-01-14 DIAGNOSIS — Y92009 Unspecified place in unspecified non-institutional (private) residence as the place of occurrence of the external cause: Secondary | ICD-10-CM | POA: Diagnosis not present

## 2021-01-19 ENCOUNTER — Telehealth: Payer: Self-pay

## 2021-01-19 NOTE — Telephone Encounter (Signed)
Anderson Malta from [employer] office with Gaetano Net, NP [phone]) stating a UA and culture were done on patient on 01/14/21. Due to patient's history of recurrent UTI's, allergy list, and taking suppression abx they would like to have Shannon's advise on how to treat positive culture. Per [facility] patient should take daily Keflex 250mg  twice daily for 7 days and follow up in clinic with her on Monday. Discuss possible cath UA due to contamination of previous specimen. Anderson Malta w/PCP was notified of this and states she will notify the patient. Appointment was made

## 2021-01-20 DIAGNOSIS — M79675 Pain in left toe(s): Secondary | ICD-10-CM | POA: Diagnosis not present

## 2021-01-20 DIAGNOSIS — E119 Type 2 diabetes mellitus without complications: Secondary | ICD-10-CM | POA: Diagnosis not present

## 2021-01-20 DIAGNOSIS — B351 Tinea unguium: Secondary | ICD-10-CM | POA: Diagnosis not present

## 2021-01-20 DIAGNOSIS — M79674 Pain in right toe(s): Secondary | ICD-10-CM | POA: Diagnosis not present

## 2021-01-21 NOTE — Progress Notes (Deleted)
01/24/2021 8:54 AM   Heidi Arias June 30, 1931 [id number]  Referring provider: Sallee Lange, NP [address] [place],  [place] [address]  No chief complaint on file.   Urological history: 1. rUTI's -contributing factors of age, vaginal atrophy, diarrhea, incontinence and poor liquid intake  -documented positive urine cultures over the last year  E.coli pan-sensitive 04/23/2020  Klebsiella pneumoniae resistant to ampicillin 05/04/2020  Klebsiella pneumoniae resistant to ampicillin 09/23/2020  Klebsiella pneumoniae resistant to ampicillin 11/22/2020  Pseudomonas aeruginosa 12/29/2020  Pseudomonas aeruginosa 01/14/2021     -RUS 2021 NED  2. High risk hematuria -non-smoker -CTU 2018  -cysto 2018 Mild trabeculation with a large wide mouth diverticulum involving the majority of the right lateral wall -urine cytology 2018 negative -UA neg for micro heme   3. Mixed incontinence -contributing factors of age, vaginal atrophy, pelvic surgery and depression -managed with Myrbetriq 50 mg daily   4. Vaginal atrophy -managed with vaginal estrogen cream three nights weekly  No chief complaint on file.    HPI: Heidi Arias is 87 y.o. female who presents today for dysuria with her daughter, Heidi Arias.    She is experiencing urgency, dysuria, incontinence, LBP and suprapubic pain.  These symptoms have been going on for a few days.    Patient denies any modifying or aggravating factors.  Patient denies any gross hematuria or flank pain.  Patient denies any fevers, chills, nausea or vomiting.    UA nitrite positive, > 50 WBC's and many bacteria.    PVR 37 mL.   She is experiencing moderate urgency, 4-7 day time voids and 0-3 nocturia.  She engages in toilet mapping.    She is experiencing 8 or more incontinence episodes weekly.    PMH: Past Medical History:  Diagnosis Date   Anxiety    Cancer ([place])    melanoma   Cancer ([place])    squamous cell    Endometriosis    GERD (gastroesophageal reflux disease)    History of stomach ulcers    Kidney failure    Recurrent UTI (urinary tract infection)    Rheumatoid arthritis ([place])     Surgical History: Past Surgical History:  Procedure Laterality Date   ABDOMINAL HYSTERECTOMY     APPENDECTOMY     CATARACT EXTRACTION     CHOLECYSTECTOMY     COLON SURGERY     ESOPHAGOGASTRODUODENOSCOPY (EGD) WITH PROPOFOL N/A 03/15/2016   Procedure: ESOPHAGOGASTRODUODENOSCOPY (EGD) WITH PROPOFOL;  Surgeon: Manya Silvas, MD;  Location: [facility];  Service: Endoscopy;  Laterality: N/A;   INNER EAR SURGERY     wears hearing aide   TONSILLECTOMY      Home Medications:  Allergies as of 01/24/2021       Reactions   Ciprofloxacin Hcl Rash   Codeine Sulfate    Other reaction(s): Unknown   Doxycycline Hyclate Rash   Penicillin G Rash   Sulfamethoxazole-trimethoprim Rash        Medication List        Accurate as of January 21, 2021  8:54 AM. If you have any questions, ask your nurse or doctor.          AZO URINARY PAIN PO Take by mouth as needed.   cephALEXin 250 MG capsule Commonly known as: Keflex Take 1 capsule (250 mg total) by mouth daily.   hydrOXYzine 25 MG tablet Commonly known as: ATARAX/VISTARIL Take 25 mg by mouth daily.   mupirocin ointment 2 % Commonly known as: [employer]  Myrbetriq 50 MG Tb24 tablet Generic drug: mirabegron ER TAKE (1) TABLET BY MOUTH EVERY DAY   omeprazole 40 MG capsule Commonly known as: PRILOSEC Take 1 capsule by mouth 2 (two) times daily. Take 30 minutes before breakfast and take 30 minutes before dinner.   Premarin vaginal cream Generic drug: conjugated estrogens Place 1 Applicatorful vaginally daily. Apply 0.40m (pea-sized amount)  just inside the vaginal introitus with a finger-tip on Monday, Wednesday and Friday nights.   sertraline 50 MG tablet Commonly known as: ZOLOFT   VITAMIN C PO Take by mouth.   VITAMIN D3 PO Take  by mouth.        Allergies:  Allergies  Allergen Reactions   Ciprofloxacin Hcl Rash   Codeine Sulfate     Other reaction(s): Unknown   Doxycycline Hyclate Rash   Penicillin G Rash   Sulfamethoxazole-Trimethoprim Rash    Family History: Family History  Problem Relation Age of Onset   CVA Father    Breast cancer Cousin        mat cousin   Prostate cancer Neg Hx    Kidney cancer Neg Hx    Kidney disease Neg Hx    Bladder Cancer Neg Hx     Social History:  reports that she has never smoked. She has never used smokeless tobacco. She reports that she does not drink alcohol and does not use drugs.  ROS: Pertinent ROS in HPI  Physical Exam: There were no vitals taken for this visit.  Constitutional:  Well nourished. Alert and oriented, No acute distress. HEENT: [place] AT, mask in place.  Trachea midline Cardiovascular: No clubbing, cyanosis, or edema. Respiratory: Normal respiratory effort, no increased work of breathing. Neurologic: Grossly intact, no focal deficits, moving all 4 extremities. Psychiatric: Normal mood and affect.    Laboratory Data: WBC (White Blood Cell Count) 4.1 - 10.2 10^3/uL 6.8   RBC (Red Blood Cell Count) 4.04 - 5.48 10^6/uL 3.89 Low    Hemoglobin 12.0 - 15.0 gm/dL 11.0 Low    Hematocrit 35.0 - 47.0 % 33.7 Low    MCV (Mean Corpuscular Volume) 80.0 - 100.0 fl 86.6   MCH (Mean Corpuscular Hemoglobin) 27.0 - 31.2 pg 28.3   MCHC (Mean Corpuscular Hemoglobin Concentration) 32.0 - 36.0 gm/dL 32.6   Platelet Count 150 - 450 10^3/uL 273   RDW-CV (Red Cell Distribution Width) 11.6 - 14.8 % 12.7   MPV (Mean Platelet Volume) 9.4 - 12.4 fl 10.2   Neutrophils 1.50 - 7.80 10^3/uL 4.00   Lymphocytes 1.00 - 3.60 10^3/uL 2.10   Mixed Count 0.10 - 0.90 10^3/uL 0.70   Neutrophil % 32.0 - 70.0 % 59.5   Lymphocyte % 10.0 - 50.0 % 30.4   Mixed % 3.0 - 14.4 % 10.1   Resulting Agency  [facility]  Specimen Collected: 10/28/20 15:45 Last Resulted:  10/28/20 16:14  Received From: [facility] Result Received: 11/19/20 20:48     Glucose 70 - 110 mg/dL 88   Sodium 136 - 145 mmol/L 139   Potassium 3.6 - 5.1 mmol/L 4.4   Chloride 97 - 109 mmol/L 107   Carbon Dioxide (CO2) 22.0 - 32.0 mmol/L 26.1   Urea Nitrogen (BUN) 7 - 25 mg/dL 19   Creatinine 0.6 - 1.1 mg/dL 1.1   Glomerular Filtration Rate (eGFR), MDRD Estimate >60 mL/min/1.73sq m 47 Low    Calcium 8.7 - 10.3 mg/dL 9.6   AST  8 - 39 U/L 12  ALT  5 - 38 U/L 7   Alk Phos (alkaline Phosphatase) 34 - 104 U/L 67   Albumin 3.5 - 4.8 g/dL 3.9   Bilirubin, Total 0.3 - 1.2 mg/dL 0.3   Protein, Total 6.1 - 7.9 g/dL 6.5   A/G Ratio 1.0 - 5.0 gm/dL 1.5   Resulting Agency  [facility]  Specimen Collected: 10/28/20 15:45 Last Resulted: 10/29/20 13:36  Received From: [facility]  Result Received: 11/19/20 20:48   Hemoglobin A1C 4.2 - 5.6 % 6.2 High    Average Blood Glucose (Calc) mg/dL 131   Resulting Agency  [facility]  Narrative Performed by [facility] Normal Range:    4.2 - 5.6%  Increased Risk:  5.7 - 6.4%  Diabetes:        >= 6.5%  Glycemic Control for adults with diabetes:  <7%   Specimen Collected: 10/28/20 15:45 Last Resulted: 10/29/20 14:29  Received From: [facility]  Result Received: 11/19/20 20:48     Urinalysis Component     Latest Ref Rng & Units 11/22/2020  Color, Urine     YELLOW YELLOW  Appearance     CLEAR CLOUDY (A)  Specific Gravity, Urine     1.005 - 1.030 1.020  pH     5.0 - 8.0 5.5  Glucose, UA     NEGATIVE mg/dL NEGATIVE  Hgb urine dipstick     NEGATIVE TRACE (A)  Bilirubin Urine     NEGATIVE NEGATIVE  Ketones, ur     NEGATIVE mg/dL NEGATIVE  Protein     NEGATIVE mg/dL NEGATIVE  Nitrite     NEGATIVE POSITIVE (A)  Leukocytes,Ua     NEGATIVE SMALL (A)  Squamous Epithelial / LPF     0 - 5 0-5  WBC, UA     0 - 5 WBC/hpf >50  RBC / HPF     0  - 5 RBC/hpf 0-5  Bacteria, UA     NONE SEEN MANY (A)  I have reviewed the labs.   Pertinent Imaging: Results for CORNELLA, EMMER (MRN [id number]) as of 11/22/2020 22:47  Ref. Range 11/22/2020 15:23  Scan Result Unknown 74m    Assessment & Plan:    1. Dysuria -UA grossly infected -urine sent for culture -start Macrobid 100 mg, twice daily for seven days  2. rUTI's - patient is instructed to increase their water intake until the urine is pale yellow or clear (10 to 12 cups daily)  - patient is instructed to take probiotics (yogurt, oral pills or vaginal suppositories), take cranberry pills or drink the juice, D-mannose and Vitamin C 1,000 mg daily to acidify the urine  -we will start a suppressive antibiotic once urine culture results are available and she has completed her seven day course of a culture appropriate antibiotic -follow up in three months   3. Incontinence -continue Myrbetriq 50 mg daily  -reassess once UTI is resolved   4. Vaginal atrophy -continue vaginal estrogen cream three nights weekly                                             No follow-ups on file.  These notes generated with voice recognition software. I apologize for typographical errors.  SZara Council [facility] [address]  [place], [place] [address] [phone]

## 2021-01-24 ENCOUNTER — Ambulatory Visit: Payer: PPO | Admitting: Urology

## 2021-01-24 ENCOUNTER — Other Ambulatory Visit: Payer: Self-pay

## 2021-01-24 ENCOUNTER — Other Ambulatory Visit
Admission: RE | Admit: 2021-01-24 | Discharge: 2021-01-24 | Disposition: A | Discharge status: Home / Self Care | Payer: PPO | Source: Ambulatory Visit | Attending: Urology | Admitting: Urology

## 2021-01-24 ENCOUNTER — Encounter: Payer: Self-pay | Admitting: Urology

## 2021-01-24 VITALS — BP 128/78 | HR 75 | Ht 62.0 in | Wt 132.0 lb

## 2021-01-24 DIAGNOSIS — E1122 Type 2 diabetes mellitus with diabetic chronic kidney disease: Secondary | ICD-10-CM | POA: Diagnosis not present

## 2021-01-24 DIAGNOSIS — M545 Low back pain, unspecified: Secondary | ICD-10-CM | POA: Diagnosis not present

## 2021-01-24 DIAGNOSIS — H9193 Unspecified hearing loss, bilateral: Secondary | ICD-10-CM | POA: Diagnosis not present

## 2021-01-24 DIAGNOSIS — R319 Hematuria, unspecified: Secondary | ICD-10-CM

## 2021-01-24 DIAGNOSIS — Z87891 Personal history of nicotine dependence: Secondary | ICD-10-CM | POA: Diagnosis not present

## 2021-01-24 DIAGNOSIS — R296 Repeated falls: Secondary | ICD-10-CM | POA: Diagnosis not present

## 2021-01-24 DIAGNOSIS — N3946 Mixed incontinence: Secondary | ICD-10-CM | POA: Diagnosis not present

## 2021-01-24 DIAGNOSIS — N952 Postmenopausal atrophic vaginitis: Secondary | ICD-10-CM

## 2021-01-24 DIAGNOSIS — F419 Anxiety disorder, unspecified: Secondary | ICD-10-CM | POA: Diagnosis not present

## 2021-01-24 DIAGNOSIS — N183 Chronic kidney disease, stage 3 unspecified: Secondary | ICD-10-CM | POA: Diagnosis not present

## 2021-01-24 DIAGNOSIS — Z85828 Personal history of other malignant neoplasm of skin: Secondary | ICD-10-CM | POA: Diagnosis not present

## 2021-01-24 DIAGNOSIS — N390 Urinary tract infection, site not specified: Secondary | ICD-10-CM | POA: Diagnosis not present

## 2021-01-24 DIAGNOSIS — D509 Iron deficiency anemia, unspecified: Secondary | ICD-10-CM | POA: Diagnosis not present

## 2021-01-24 DIAGNOSIS — K5790 Diverticulosis of intestine, part unspecified, without perforation or abscess without bleeding: Secondary | ICD-10-CM | POA: Diagnosis not present

## 2021-01-24 DIAGNOSIS — B965 Pseudomonas (aeruginosa) (mallei) (pseudomallei) as the cause of diseases classified elsewhere: Secondary | ICD-10-CM | POA: Diagnosis not present

## 2021-01-24 DIAGNOSIS — A499 Bacterial infection, unspecified: Secondary | ICD-10-CM | POA: Diagnosis present

## 2021-01-24 DIAGNOSIS — E559 Vitamin D deficiency, unspecified: Secondary | ICD-10-CM | POA: Diagnosis not present

## 2021-01-24 DIAGNOSIS — M479 Spondylosis, unspecified: Secondary | ICD-10-CM | POA: Diagnosis not present

## 2021-01-24 DIAGNOSIS — Z8616 Personal history of COVID-19: Secondary | ICD-10-CM | POA: Diagnosis not present

## 2021-01-24 DIAGNOSIS — M1990 Unspecified osteoarthritis, unspecified site: Secondary | ICD-10-CM | POA: Diagnosis not present

## 2021-01-24 DIAGNOSIS — N3941 Urge incontinence: Secondary | ICD-10-CM | POA: Diagnosis not present

## 2021-01-24 DIAGNOSIS — K219 Gastro-esophageal reflux disease without esophagitis: Secondary | ICD-10-CM | POA: Diagnosis not present

## 2021-01-24 DIAGNOSIS — Z9181 History of falling: Secondary | ICD-10-CM | POA: Diagnosis not present

## 2021-01-24 DIAGNOSIS — G8929 Other chronic pain: Secondary | ICD-10-CM | POA: Diagnosis not present

## 2021-01-24 LAB — URINALYSIS, COMPLETE (UACMP) WITH MICROSCOPIC
Bilirubin Urine: NEGATIVE
Glucose, UA: NEGATIVE mg/dL
Ketones, ur: NEGATIVE mg/dL
Nitrite: NEGATIVE
Protein, ur: NEGATIVE mg/dL
Specific Gravity, Urine: 1.02 (ref 1.005–1.030)
pH: 7 (ref 5.0–8.0)

## 2021-01-24 NOTE — Progress Notes (Signed)
01/24/2021 9:50 AM   Heidi Arias Heidi Arias Aug 24, 1931 [id number]  Referring provider: Sallee Lange, NP [address] [place],  [place] [address]  Chief Complaint  Patient presents with   Follow-up   Urinary Tract Infection   Urological history: 1. rUTI's -contributing factors of age, vaginal atrophy, diarrhea, incontinence and poor liquid intake  -documented positive urine cultures over the last year             E.coli pan-sensitive 04/23/2020             Klebsiella pneumoniae resistant to ampicillin 05/04/2020             Klebsiella pneumoniae resistant to ampicillin 09/23/2020             Klebsiella pneumoniae resistant to ampicillin 11/22/2020             Pseudomonas aeruginosa 12/29/2020             Pseudomonas aeruginosa 01/14/2021                    -RUS 2021 NED   2. High risk hematuria -non-smoker -CTU 2018  -cysto 2018 Mild trabeculation with a large wide mouth diverticulum involving the majority of the right lateral wall -urine cytology 2018 negative -UA negative for micro heme   3. Mixed incontinence -contributing factors of age, vaginal atrophy, pelvic surgery and depression -managed with Myrbetriq 50 mg daily    4. Vaginal atrophy -managed with vaginal estrogen cream three nights weekly  HPI: Heidi Arias is a 87 y.o. female who presents today for possible UTI.      CATH UA 21-50 WBC's and many bacteria.    PVR 70 mL    She has episodes with loose stools that precipitates her UTI's.  She states she is using the vaginal estrogen cream, but she is not taking the cranberry tablets, probiotics, D-mannose and not drinking a lot of water.    Today, she is experiencing suprapubic pain that is relieved with voiding.    Patient denies any modifying or aggravating factors.  Patient denies any gross hematuria or flank pain.  Patient denies any fevers, chills, nausea or vomiting.    She is taking Keflex 250 mg twice daily now.    PMH: Past  Medical History:  Diagnosis Date   Anxiety    Cancer ([place] [facility])    melanoma   Cancer ([place])    squamous cell   Endometriosis    GERD (gastroesophageal reflux disease)    History of stomach ulcers    Kidney failure    Recurrent UTI (urinary tract infection)    Rheumatoid arthritis ([facility])     Surgical History: Past Surgical History:  Procedure Laterality Date   ABDOMINAL HYSTERECTOMY     APPENDECTOMY     CATARACT EXTRACTION     CHOLECYSTECTOMY     COLON SURGERY     ESOPHAGOGASTRODUODENOSCOPY (EGD) WITH PROPOFOL N/A 03/15/2016   Procedure: ESOPHAGOGASTRODUODENOSCOPY (EGD) WITH PROPOFOL;  Surgeon: Manya Silvas, MD;  Location: [facility];  Service: Endoscopy;  Laterality: N/A;   INNER EAR SURGERY     wears hearing aide   TONSILLECTOMY      Home Medications:  Allergies as of 01/24/2021       Reactions   Ciprofloxacin Hcl Rash   Codeine Sulfate    Other reaction(s): Unknown   Doxycycline Hyclate Rash   Penicillin G Rash   Sulfamethoxazole-trimethoprim Rash  Medication List        Accurate as of January 24, 2021  9:50 AM. If you have any questions, ask your nurse or doctor.          AZO URINARY PAIN PO Take by mouth as needed.   cephALEXin 250 MG capsule Commonly known as: Keflex Take 1 capsule (250 mg total) by mouth daily.   hydrOXYzine 25 MG tablet Commonly known as: ATARAX/VISTARIL Take 25 mg by mouth daily.   mupirocin ointment 2 % Commonly known as: BACTROBAN   Myrbetriq 50 MG Tb24 tablet Generic drug: mirabegron ER TAKE (1) TABLET BY MOUTH EVERY DAY   omeprazole 40 MG capsule Commonly known as: PRILOSEC Take 1 capsule by mouth 2 (two) times daily. Take 30 minutes before breakfast and take 30 minutes before dinner.   Premarin vaginal cream Generic drug: conjugated estrogens Place 1 Applicatorful vaginally daily. Apply 0.68m (pea-sized amount)  just inside the vaginal introitus with a finger-tip on Monday, Wednesday and Friday  nights.   sertraline 50 MG tablet Commonly known as: ZOLOFT   VITAMIN C PO Take by mouth.   VITAMIN D3 PO Take by mouth.        Allergies:  Allergies  Allergen Reactions   Ciprofloxacin Hcl Rash   Codeine Sulfate     Other reaction(s): Unknown   Doxycycline Hyclate Rash   Penicillin G Rash   Sulfamethoxazole-Trimethoprim Rash    Family History: Family History  Problem Relation Age of Onset   CVA Father    Breast cancer Cousin        mat cousin   Prostate cancer Neg Hx    Kidney cancer Neg Hx    Kidney disease Neg Hx    Bladder Cancer Neg Hx     Social History:  reports that she has never smoked. She has never used smokeless tobacco. She reports that she does not drink alcohol and does not use drugs.  ROS: Pertinent ROS in HPI  Physical Exam: BP 128/78   Pulse 75   Ht 5' 2"  (1.575 m)   Wt 132 lb (59.9 kg)   BMI 24.14 kg/m   Constitutional:  Well nourished. Alert and oriented, No acute distress. HEENT: [place] AT, mask in place.  Trachea midline Cardiovascular: No clubbing, cyanosis, or edema. Respiratory: Normal respiratory effort, no increased work of breathing. Neurologic: Grossly intact, no focal deficits, moving all 4 extremities. Psychiatric: Normal mood and affect.  Laboratory Data: WBC 3.6 - 11.2 10*9/L 7.1   RBC 3.95 - 5.13 10*12/L 3.82 Low    HGB 11.3 - 14.9 g/dL 10.7 Low    HCT 34.0 - 44.0 % 31.5 Low    MCV 77.6 - 95.7 fL 82.5   MCH 25.9 - 32.4 pg 28.1   MCHC 32.0 - 36.0 g/dL 34.0   RDW 12.2 - 15.2 % 14.1   MPV 6.8 - 10.7 fL 7.9   Platelet 150 - 450 10*9/L 320   [facility]  Specimen Collected: 12/30/20 05:25 Last Resulted: 12/30/20 05:30  Received From: [facility] Result Received: 01/19/21 14:46   Sodium 135 - 145 mmol/L 140   Potassium 3.4 - 4.8 mmol/L 3.9   Chloride 98 - 107 mmol/L 109 High    CO2 20.0 - 31.0 mmol/L 24.1   Anion Gap 5 - 14 mmol/L 7   BUN 9 - 23 mg/dL 13   Creatinine 0.60 -  0.80 mg/dL 0.87 High    BUN/Creatinine Ratio  15   eGFR CKD-EPI (2021) Female >=60 mL/min/1.32m 65   Comment: eGFR calculated with CKD-EPI 2021 equation in accordance with [employer] Ne[employer] Task Force recommendations.  Glucose 70 - 179 mg/dL 105   Calcium 8.7 - 10.4 mg/dL 8.8   [facility]  Specimen Collected: 12/30/20 05:25 Last Resulted: 12/30/20 05:49  Received From: [facility] Result Received: 01/19/21 14:46   Urinalysis Component     Latest Ref Rng & Units 01/24/2021  Color, Urine     YELLOW YELLOW  Appearance     CLEAR CLEAR  Specific Gravity, Urine     1.005 - 1.030 1.020  pH     5.0 - 8.0 7.0  Glucose, UA     NEGATIVE mg/dL NEGATIVE  Hgb urine dipstick     NEGATIVE TRACE (A)  Bilirubin Urine     NEGATIVE NEGATIVE  Ketones, ur     NEGATIVE mg/dL NEGATIVE  Protein     NEGATIVE mg/dL NEGATIVE  Nitrite     NEGATIVE NEGATIVE  Leukocytes,Ua     NEGATIVE SMALL (A)  Squamous Epithelial / LPF     0 - 5 0-5  Non Squamous Epithelial     NONE SEEN PRESENT (A  WBC, UA     0 - 5 WBC/hpf 21-50  RBC / HPF     0 - 5 RBC/hpf 0-5  Bacteria, UA     NONE SEEN MANY (A)  I have reviewed the labs.   Pertinent Imaging: N/A   Assessment & Plan:    1. Recurrent UTI -Urinalysis, Complete w Microscopic; Future -Urine culture - criteria for recurrent UTI has been met with 2 or more infections in 6 months or 3 or greater infections in one year  - patient is instructed to increase their water intake until the urine is pale yellow or clear (10 to 12 cups daily)  - patient is instructed to take probiotics (yogurt, oral pills or vaginal suppositories), take cranberry pills or drink the juice and D-mannose -she should remove soiled underwear and pads immediately  - avoid soaking in tubs and wipe front to back after urinating  -obtain a RUS to rule out any surgically correctable etiology for her  UTI's   2. Vaginal atrophy -continue applying the vaginal estrogen cream three nights weekly  3. Mixed incontinence -continue the Myrbetriq 50 mg daily  4. High risk hematuria -UA negative for micro heme                                          Return for pending urine culture results .  These notes generated with voice recognition software. I apologize for typographical errors.  SZara Council PA-C  [facility] [address] [place] [phone]

## 2021-01-24 NOTE — Patient Instructions (Signed)
Continue to apply the vaginal estrogen cream three nights weekly, take cranberry tablets, take probiotics, take D-mannose and increase water intake.

## 2021-01-26 ENCOUNTER — Other Ambulatory Visit: Payer: Self-pay | Admitting: *Deleted

## 2021-01-26 DIAGNOSIS — B965 Pseudomonas (aeruginosa) (mallei) (pseudomallei) as the cause of diseases classified elsewhere: Secondary | ICD-10-CM | POA: Diagnosis not present

## 2021-01-26 DIAGNOSIS — K5790 Diverticulosis of intestine, part unspecified, without perforation or abscess without bleeding: Secondary | ICD-10-CM | POA: Diagnosis not present

## 2021-01-26 DIAGNOSIS — M1990 Unspecified osteoarthritis, unspecified site: Secondary | ICD-10-CM | POA: Diagnosis not present

## 2021-01-26 DIAGNOSIS — E559 Vitamin D deficiency, unspecified: Secondary | ICD-10-CM | POA: Diagnosis not present

## 2021-01-26 DIAGNOSIS — Z8616 Personal history of COVID-19: Secondary | ICD-10-CM | POA: Diagnosis not present

## 2021-01-26 DIAGNOSIS — R296 Repeated falls: Secondary | ICD-10-CM | POA: Diagnosis not present

## 2021-01-26 DIAGNOSIS — Z85828 Personal history of other malignant neoplasm of skin: Secondary | ICD-10-CM | POA: Diagnosis not present

## 2021-01-26 DIAGNOSIS — D509 Iron deficiency anemia, unspecified: Secondary | ICD-10-CM | POA: Diagnosis not present

## 2021-01-26 DIAGNOSIS — M545 Low back pain, unspecified: Secondary | ICD-10-CM | POA: Diagnosis not present

## 2021-01-26 DIAGNOSIS — N183 Chronic kidney disease, stage 3 unspecified: Secondary | ICD-10-CM | POA: Diagnosis not present

## 2021-01-26 DIAGNOSIS — H9193 Unspecified hearing loss, bilateral: Secondary | ICD-10-CM | POA: Diagnosis not present

## 2021-01-26 DIAGNOSIS — K219 Gastro-esophageal reflux disease without esophagitis: Secondary | ICD-10-CM | POA: Diagnosis not present

## 2021-01-26 DIAGNOSIS — N3941 Urge incontinence: Secondary | ICD-10-CM | POA: Diagnosis not present

## 2021-01-26 DIAGNOSIS — N390 Urinary tract infection, site not specified: Secondary | ICD-10-CM | POA: Diagnosis not present

## 2021-01-26 DIAGNOSIS — F419 Anxiety disorder, unspecified: Secondary | ICD-10-CM | POA: Diagnosis not present

## 2021-01-26 DIAGNOSIS — Z9181 History of falling: Secondary | ICD-10-CM | POA: Diagnosis not present

## 2021-01-26 DIAGNOSIS — M479 Spondylosis, unspecified: Secondary | ICD-10-CM | POA: Diagnosis not present

## 2021-01-26 DIAGNOSIS — G8929 Other chronic pain: Secondary | ICD-10-CM | POA: Diagnosis not present

## 2021-01-26 DIAGNOSIS — E1122 Type 2 diabetes mellitus with diabetic chronic kidney disease: Secondary | ICD-10-CM | POA: Diagnosis not present

## 2021-01-26 DIAGNOSIS — Z87891 Personal history of nicotine dependence: Secondary | ICD-10-CM | POA: Diagnosis not present

## 2021-01-26 LAB — URINE CULTURE: Culture: >=100000 — AB

## 2021-01-26 MED ORDER — CEFDINIR 300 MG PO CAPS: 300.0000 mg | ORAL_CAPSULE | Freq: Two times a day (BID) | ORAL | 0 refills | Status: AC

## 2021-02-14 DIAGNOSIS — Z87891 Personal history of nicotine dependence: Secondary | ICD-10-CM | POA: Diagnosis not present

## 2021-02-14 DIAGNOSIS — G8929 Other chronic pain: Secondary | ICD-10-CM | POA: Diagnosis not present

## 2021-02-14 DIAGNOSIS — F419 Anxiety disorder, unspecified: Secondary | ICD-10-CM | POA: Diagnosis not present

## 2021-02-14 DIAGNOSIS — H9193 Unspecified hearing loss, bilateral: Secondary | ICD-10-CM | POA: Diagnosis not present

## 2021-02-14 DIAGNOSIS — E1122 Type 2 diabetes mellitus with diabetic chronic kidney disease: Secondary | ICD-10-CM | POA: Diagnosis not present

## 2021-02-14 DIAGNOSIS — K5790 Diverticulosis of intestine, part unspecified, without perforation or abscess without bleeding: Secondary | ICD-10-CM | POA: Diagnosis not present

## 2021-02-14 DIAGNOSIS — M545 Low back pain, unspecified: Secondary | ICD-10-CM | POA: Diagnosis not present

## 2021-02-14 DIAGNOSIS — Z9181 History of falling: Secondary | ICD-10-CM | POA: Diagnosis not present

## 2021-02-14 DIAGNOSIS — M479 Spondylosis, unspecified: Secondary | ICD-10-CM | POA: Diagnosis not present

## 2021-02-14 DIAGNOSIS — N183 Chronic kidney disease, stage 3 unspecified: Secondary | ICD-10-CM | POA: Diagnosis not present

## 2021-02-14 DIAGNOSIS — K219 Gastro-esophageal reflux disease without esophagitis: Secondary | ICD-10-CM | POA: Diagnosis not present

## 2021-02-14 DIAGNOSIS — Z8616 Personal history of COVID-19: Secondary | ICD-10-CM | POA: Diagnosis not present

## 2021-02-14 DIAGNOSIS — D509 Iron deficiency anemia, unspecified: Secondary | ICD-10-CM | POA: Diagnosis not present

## 2021-02-14 DIAGNOSIS — N390 Urinary tract infection, site not specified: Secondary | ICD-10-CM | POA: Diagnosis not present

## 2021-02-14 DIAGNOSIS — N3941 Urge incontinence: Secondary | ICD-10-CM | POA: Diagnosis not present

## 2021-02-14 DIAGNOSIS — Z85828 Personal history of other malignant neoplasm of skin: Secondary | ICD-10-CM | POA: Diagnosis not present

## 2021-02-14 DIAGNOSIS — R296 Repeated falls: Secondary | ICD-10-CM | POA: Diagnosis not present

## 2021-02-14 DIAGNOSIS — M1990 Unspecified osteoarthritis, unspecified site: Secondary | ICD-10-CM | POA: Diagnosis not present

## 2021-02-14 DIAGNOSIS — E559 Vitamin D deficiency, unspecified: Secondary | ICD-10-CM | POA: Diagnosis not present

## 2021-02-14 DIAGNOSIS — B965 Pseudomonas (aeruginosa) (mallei) (pseudomallei) as the cause of diseases classified elsewhere: Secondary | ICD-10-CM | POA: Diagnosis not present

## 2021-02-18 DIAGNOSIS — Z85828 Personal history of other malignant neoplasm of skin: Secondary | ICD-10-CM | POA: Diagnosis not present

## 2021-02-18 DIAGNOSIS — K5790 Diverticulosis of intestine, part unspecified, without perforation or abscess without bleeding: Secondary | ICD-10-CM | POA: Diagnosis not present

## 2021-02-18 DIAGNOSIS — N183 Chronic kidney disease, stage 3 unspecified: Secondary | ICD-10-CM | POA: Diagnosis not present

## 2021-02-18 DIAGNOSIS — H9193 Unspecified hearing loss, bilateral: Secondary | ICD-10-CM | POA: Diagnosis not present

## 2021-02-18 DIAGNOSIS — G8929 Other chronic pain: Secondary | ICD-10-CM | POA: Diagnosis not present

## 2021-02-18 DIAGNOSIS — N3941 Urge incontinence: Secondary | ICD-10-CM | POA: Diagnosis not present

## 2021-02-18 DIAGNOSIS — Z87891 Personal history of nicotine dependence: Secondary | ICD-10-CM | POA: Diagnosis not present

## 2021-02-18 DIAGNOSIS — R296 Repeated falls: Secondary | ICD-10-CM | POA: Diagnosis not present

## 2021-02-18 DIAGNOSIS — D509 Iron deficiency anemia, unspecified: Secondary | ICD-10-CM | POA: Diagnosis not present

## 2021-02-18 DIAGNOSIS — F419 Anxiety disorder, unspecified: Secondary | ICD-10-CM | POA: Diagnosis not present

## 2021-02-18 DIAGNOSIS — E559 Vitamin D deficiency, unspecified: Secondary | ICD-10-CM | POA: Diagnosis not present

## 2021-02-18 DIAGNOSIS — Z8616 Personal history of COVID-19: Secondary | ICD-10-CM | POA: Diagnosis not present

## 2021-02-18 DIAGNOSIS — M545 Low back pain, unspecified: Secondary | ICD-10-CM | POA: Diagnosis not present

## 2021-02-18 DIAGNOSIS — B965 Pseudomonas (aeruginosa) (mallei) (pseudomallei) as the cause of diseases classified elsewhere: Secondary | ICD-10-CM | POA: Diagnosis not present

## 2021-02-18 DIAGNOSIS — K219 Gastro-esophageal reflux disease without esophagitis: Secondary | ICD-10-CM | POA: Diagnosis not present

## 2021-02-18 DIAGNOSIS — N390 Urinary tract infection, site not specified: Secondary | ICD-10-CM | POA: Diagnosis not present

## 2021-02-18 DIAGNOSIS — M1990 Unspecified osteoarthritis, unspecified site: Secondary | ICD-10-CM | POA: Diagnosis not present

## 2021-02-18 DIAGNOSIS — Z9181 History of falling: Secondary | ICD-10-CM | POA: Diagnosis not present

## 2021-02-18 DIAGNOSIS — E1122 Type 2 diabetes mellitus with diabetic chronic kidney disease: Secondary | ICD-10-CM | POA: Diagnosis not present

## 2021-02-18 DIAGNOSIS — M479 Spondylosis, unspecified: Secondary | ICD-10-CM | POA: Diagnosis not present

## 2021-02-21 DIAGNOSIS — G8929 Other chronic pain: Secondary | ICD-10-CM | POA: Diagnosis not present

## 2021-02-21 DIAGNOSIS — M479 Spondylosis, unspecified: Secondary | ICD-10-CM | POA: Diagnosis not present

## 2021-02-21 DIAGNOSIS — M1990 Unspecified osteoarthritis, unspecified site: Secondary | ICD-10-CM | POA: Diagnosis not present

## 2021-02-21 DIAGNOSIS — D509 Iron deficiency anemia, unspecified: Secondary | ICD-10-CM | POA: Diagnosis not present

## 2021-02-21 DIAGNOSIS — Z8616 Personal history of COVID-19: Secondary | ICD-10-CM | POA: Diagnosis not present

## 2021-02-21 DIAGNOSIS — F419 Anxiety disorder, unspecified: Secondary | ICD-10-CM | POA: Diagnosis not present

## 2021-02-21 DIAGNOSIS — N390 Urinary tract infection, site not specified: Secondary | ICD-10-CM | POA: Diagnosis not present

## 2021-02-21 DIAGNOSIS — N3941 Urge incontinence: Secondary | ICD-10-CM | POA: Diagnosis not present

## 2021-02-21 DIAGNOSIS — E1122 Type 2 diabetes mellitus with diabetic chronic kidney disease: Secondary | ICD-10-CM | POA: Diagnosis not present

## 2021-02-21 DIAGNOSIS — N183 Chronic kidney disease, stage 3 unspecified: Secondary | ICD-10-CM | POA: Diagnosis not present

## 2021-02-21 DIAGNOSIS — E559 Vitamin D deficiency, unspecified: Secondary | ICD-10-CM | POA: Diagnosis not present

## 2021-02-21 DIAGNOSIS — K219 Gastro-esophageal reflux disease without esophagitis: Secondary | ICD-10-CM | POA: Diagnosis not present

## 2021-02-21 DIAGNOSIS — Z85828 Personal history of other malignant neoplasm of skin: Secondary | ICD-10-CM | POA: Diagnosis not present

## 2021-02-21 DIAGNOSIS — M545 Low back pain, unspecified: Secondary | ICD-10-CM | POA: Diagnosis not present

## 2021-02-21 DIAGNOSIS — B965 Pseudomonas (aeruginosa) (mallei) (pseudomallei) as the cause of diseases classified elsewhere: Secondary | ICD-10-CM | POA: Diagnosis not present

## 2021-02-21 DIAGNOSIS — R296 Repeated falls: Secondary | ICD-10-CM | POA: Diagnosis not present

## 2021-02-21 DIAGNOSIS — Z9181 History of falling: Secondary | ICD-10-CM | POA: Diagnosis not present

## 2021-02-21 DIAGNOSIS — H9193 Unspecified hearing loss, bilateral: Secondary | ICD-10-CM | POA: Diagnosis not present

## 2021-02-21 DIAGNOSIS — Z87891 Personal history of nicotine dependence: Secondary | ICD-10-CM | POA: Diagnosis not present

## 2021-02-21 DIAGNOSIS — K5790 Diverticulosis of intestine, part unspecified, without perforation or abscess without bleeding: Secondary | ICD-10-CM | POA: Diagnosis not present

## 2021-02-24 ENCOUNTER — Other Ambulatory Visit: Payer: Self-pay

## 2021-02-24 ENCOUNTER — Ambulatory Visit
Admission: RE | Admit: 2021-02-24 | Discharge: 2021-02-24 | Disposition: A | Discharge status: Home / Self Care | Payer: PPO | Source: Ambulatory Visit | Attending: Urology | Admitting: Urology

## 2021-02-24 DIAGNOSIS — M479 Spondylosis, unspecified: Secondary | ICD-10-CM | POA: Diagnosis not present

## 2021-02-24 DIAGNOSIS — M1990 Unspecified osteoarthritis, unspecified site: Secondary | ICD-10-CM | POA: Diagnosis not present

## 2021-02-24 DIAGNOSIS — D509 Iron deficiency anemia, unspecified: Secondary | ICD-10-CM | POA: Diagnosis not present

## 2021-02-24 DIAGNOSIS — N390 Urinary tract infection, site not specified: Secondary | ICD-10-CM | POA: Insufficient documentation

## 2021-02-24 DIAGNOSIS — M545 Low back pain, unspecified: Secondary | ICD-10-CM | POA: Diagnosis not present

## 2021-02-24 DIAGNOSIS — E1122 Type 2 diabetes mellitus with diabetic chronic kidney disease: Secondary | ICD-10-CM | POA: Diagnosis not present

## 2021-02-24 DIAGNOSIS — Z87891 Personal history of nicotine dependence: Secondary | ICD-10-CM | POA: Diagnosis not present

## 2021-02-24 DIAGNOSIS — Z85828 Personal history of other malignant neoplasm of skin: Secondary | ICD-10-CM | POA: Diagnosis not present

## 2021-02-24 DIAGNOSIS — B965 Pseudomonas (aeruginosa) (mallei) (pseudomallei) as the cause of diseases classified elsewhere: Secondary | ICD-10-CM | POA: Diagnosis not present

## 2021-02-24 DIAGNOSIS — N3941 Urge incontinence: Secondary | ICD-10-CM | POA: Diagnosis not present

## 2021-02-24 DIAGNOSIS — K219 Gastro-esophageal reflux disease without esophagitis: Secondary | ICD-10-CM | POA: Diagnosis not present

## 2021-02-24 DIAGNOSIS — Z8616 Personal history of COVID-19: Secondary | ICD-10-CM | POA: Diagnosis not present

## 2021-02-24 DIAGNOSIS — K5790 Diverticulosis of intestine, part unspecified, without perforation or abscess without bleeding: Secondary | ICD-10-CM | POA: Diagnosis not present

## 2021-02-24 DIAGNOSIS — E559 Vitamin D deficiency, unspecified: Secondary | ICD-10-CM | POA: Diagnosis not present

## 2021-02-24 DIAGNOSIS — H9193 Unspecified hearing loss, bilateral: Secondary | ICD-10-CM | POA: Diagnosis not present

## 2021-02-24 DIAGNOSIS — N183 Chronic kidney disease, stage 3 unspecified: Secondary | ICD-10-CM | POA: Diagnosis not present

## 2021-02-24 DIAGNOSIS — G8929 Other chronic pain: Secondary | ICD-10-CM | POA: Diagnosis not present

## 2021-02-24 DIAGNOSIS — F419 Anxiety disorder, unspecified: Secondary | ICD-10-CM | POA: Diagnosis not present

## 2021-02-24 DIAGNOSIS — Z9181 History of falling: Secondary | ICD-10-CM | POA: Diagnosis not present

## 2021-02-24 DIAGNOSIS — R296 Repeated falls: Secondary | ICD-10-CM | POA: Diagnosis not present

## 2021-03-01 DIAGNOSIS — M479 Spondylosis, unspecified: Secondary | ICD-10-CM | POA: Diagnosis not present

## 2021-03-01 DIAGNOSIS — Z85828 Personal history of other malignant neoplasm of skin: Secondary | ICD-10-CM | POA: Diagnosis not present

## 2021-03-01 DIAGNOSIS — N3941 Urge incontinence: Secondary | ICD-10-CM | POA: Diagnosis not present

## 2021-03-01 DIAGNOSIS — K219 Gastro-esophageal reflux disease without esophagitis: Secondary | ICD-10-CM | POA: Diagnosis not present

## 2021-03-01 DIAGNOSIS — F419 Anxiety disorder, unspecified: Secondary | ICD-10-CM | POA: Diagnosis not present

## 2021-03-01 DIAGNOSIS — M1990 Unspecified osteoarthritis, unspecified site: Secondary | ICD-10-CM | POA: Diagnosis not present

## 2021-03-01 DIAGNOSIS — K5790 Diverticulosis of intestine, part unspecified, without perforation or abscess without bleeding: Secondary | ICD-10-CM | POA: Diagnosis not present

## 2021-03-01 DIAGNOSIS — G8929 Other chronic pain: Secondary | ICD-10-CM | POA: Diagnosis not present

## 2021-03-01 DIAGNOSIS — H9193 Unspecified hearing loss, bilateral: Secondary | ICD-10-CM | POA: Diagnosis not present

## 2021-03-01 DIAGNOSIS — E559 Vitamin D deficiency, unspecified: Secondary | ICD-10-CM | POA: Diagnosis not present

## 2021-03-01 DIAGNOSIS — N183 Chronic kidney disease, stage 3 unspecified: Secondary | ICD-10-CM | POA: Diagnosis not present

## 2021-03-01 DIAGNOSIS — B965 Pseudomonas (aeruginosa) (mallei) (pseudomallei) as the cause of diseases classified elsewhere: Secondary | ICD-10-CM | POA: Diagnosis not present

## 2021-03-01 DIAGNOSIS — Z87891 Personal history of nicotine dependence: Secondary | ICD-10-CM | POA: Diagnosis not present

## 2021-03-01 DIAGNOSIS — Z8616 Personal history of COVID-19: Secondary | ICD-10-CM | POA: Diagnosis not present

## 2021-03-01 DIAGNOSIS — Z9181 History of falling: Secondary | ICD-10-CM | POA: Diagnosis not present

## 2021-03-01 DIAGNOSIS — D509 Iron deficiency anemia, unspecified: Secondary | ICD-10-CM | POA: Diagnosis not present

## 2021-03-01 DIAGNOSIS — M545 Low back pain, unspecified: Secondary | ICD-10-CM | POA: Diagnosis not present

## 2021-03-01 DIAGNOSIS — E1122 Type 2 diabetes mellitus with diabetic chronic kidney disease: Secondary | ICD-10-CM | POA: Diagnosis not present

## 2021-03-01 DIAGNOSIS — N390 Urinary tract infection, site not specified: Secondary | ICD-10-CM | POA: Diagnosis not present

## 2021-03-01 DIAGNOSIS — R296 Repeated falls: Secondary | ICD-10-CM | POA: Diagnosis not present

## 2021-03-03 DIAGNOSIS — Z8616 Personal history of COVID-19: Secondary | ICD-10-CM | POA: Diagnosis not present

## 2021-03-03 DIAGNOSIS — N183 Chronic kidney disease, stage 3 unspecified: Secondary | ICD-10-CM | POA: Diagnosis not present

## 2021-03-03 DIAGNOSIS — M545 Low back pain, unspecified: Secondary | ICD-10-CM | POA: Diagnosis not present

## 2021-03-03 DIAGNOSIS — D509 Iron deficiency anemia, unspecified: Secondary | ICD-10-CM | POA: Diagnosis not present

## 2021-03-03 DIAGNOSIS — K219 Gastro-esophageal reflux disease without esophagitis: Secondary | ICD-10-CM | POA: Diagnosis not present

## 2021-03-03 DIAGNOSIS — H9193 Unspecified hearing loss, bilateral: Secondary | ICD-10-CM | POA: Diagnosis not present

## 2021-03-03 DIAGNOSIS — Z85828 Personal history of other malignant neoplasm of skin: Secondary | ICD-10-CM | POA: Diagnosis not present

## 2021-03-03 DIAGNOSIS — F419 Anxiety disorder, unspecified: Secondary | ICD-10-CM | POA: Diagnosis not present

## 2021-03-03 DIAGNOSIS — G8929 Other chronic pain: Secondary | ICD-10-CM | POA: Diagnosis not present

## 2021-03-03 DIAGNOSIS — Z87891 Personal history of nicotine dependence: Secondary | ICD-10-CM | POA: Diagnosis not present

## 2021-03-03 DIAGNOSIS — N390 Urinary tract infection, site not specified: Secondary | ICD-10-CM | POA: Diagnosis not present

## 2021-03-03 DIAGNOSIS — B965 Pseudomonas (aeruginosa) (mallei) (pseudomallei) as the cause of diseases classified elsewhere: Secondary | ICD-10-CM | POA: Diagnosis not present

## 2021-03-03 DIAGNOSIS — E559 Vitamin D deficiency, unspecified: Secondary | ICD-10-CM | POA: Diagnosis not present

## 2021-03-03 DIAGNOSIS — M479 Spondylosis, unspecified: Secondary | ICD-10-CM | POA: Diagnosis not present

## 2021-03-03 DIAGNOSIS — N3941 Urge incontinence: Secondary | ICD-10-CM | POA: Diagnosis not present

## 2021-03-03 DIAGNOSIS — K5790 Diverticulosis of intestine, part unspecified, without perforation or abscess without bleeding: Secondary | ICD-10-CM | POA: Diagnosis not present

## 2021-03-03 DIAGNOSIS — Z9181 History of falling: Secondary | ICD-10-CM | POA: Diagnosis not present

## 2021-03-03 DIAGNOSIS — E1122 Type 2 diabetes mellitus with diabetic chronic kidney disease: Secondary | ICD-10-CM | POA: Diagnosis not present

## 2021-03-03 DIAGNOSIS — M1990 Unspecified osteoarthritis, unspecified site: Secondary | ICD-10-CM | POA: Diagnosis not present

## 2021-03-03 DIAGNOSIS — R296 Repeated falls: Secondary | ICD-10-CM | POA: Diagnosis not present

## 2021-03-07 DIAGNOSIS — N3941 Urge incontinence: Secondary | ICD-10-CM | POA: Diagnosis not present

## 2021-03-07 DIAGNOSIS — D509 Iron deficiency anemia, unspecified: Secondary | ICD-10-CM | POA: Diagnosis not present

## 2021-03-07 DIAGNOSIS — Z8616 Personal history of COVID-19: Secondary | ICD-10-CM | POA: Diagnosis not present

## 2021-03-07 DIAGNOSIS — R296 Repeated falls: Secondary | ICD-10-CM | POA: Diagnosis not present

## 2021-03-07 DIAGNOSIS — Z9181 History of falling: Secondary | ICD-10-CM | POA: Diagnosis not present

## 2021-03-07 DIAGNOSIS — G8929 Other chronic pain: Secondary | ICD-10-CM | POA: Diagnosis not present

## 2021-03-07 DIAGNOSIS — E559 Vitamin D deficiency, unspecified: Secondary | ICD-10-CM | POA: Diagnosis not present

## 2021-03-07 DIAGNOSIS — B965 Pseudomonas (aeruginosa) (mallei) (pseudomallei) as the cause of diseases classified elsewhere: Secondary | ICD-10-CM | POA: Diagnosis not present

## 2021-03-07 DIAGNOSIS — N390 Urinary tract infection, site not specified: Secondary | ICD-10-CM | POA: Diagnosis not present

## 2021-03-07 DIAGNOSIS — Z87891 Personal history of nicotine dependence: Secondary | ICD-10-CM | POA: Diagnosis not present

## 2021-03-07 DIAGNOSIS — M1990 Unspecified osteoarthritis, unspecified site: Secondary | ICD-10-CM | POA: Diagnosis not present

## 2021-03-07 DIAGNOSIS — E1122 Type 2 diabetes mellitus with diabetic chronic kidney disease: Secondary | ICD-10-CM | POA: Diagnosis not present

## 2021-03-07 DIAGNOSIS — K219 Gastro-esophageal reflux disease without esophagitis: Secondary | ICD-10-CM | POA: Diagnosis not present

## 2021-03-07 DIAGNOSIS — K5790 Diverticulosis of intestine, part unspecified, without perforation or abscess without bleeding: Secondary | ICD-10-CM | POA: Diagnosis not present

## 2021-03-07 DIAGNOSIS — Z85828 Personal history of other malignant neoplasm of skin: Secondary | ICD-10-CM | POA: Diagnosis not present

## 2021-03-07 DIAGNOSIS — M479 Spondylosis, unspecified: Secondary | ICD-10-CM | POA: Diagnosis not present

## 2021-03-07 DIAGNOSIS — F419 Anxiety disorder, unspecified: Secondary | ICD-10-CM | POA: Diagnosis not present

## 2021-03-07 DIAGNOSIS — N183 Chronic kidney disease, stage 3 unspecified: Secondary | ICD-10-CM | POA: Diagnosis not present

## 2021-03-07 DIAGNOSIS — H9193 Unspecified hearing loss, bilateral: Secondary | ICD-10-CM | POA: Diagnosis not present

## 2021-03-07 DIAGNOSIS — M545 Low back pain, unspecified: Secondary | ICD-10-CM | POA: Diagnosis not present

## 2021-03-13 NOTE — Progress Notes (Signed)
03/14/2021 2:29 PM   Heidi Arias Aug 14, 1931 [id number]  Referring provider: Sallee Lange, NP [address] [place],  [place] [address]  Chief Complaint  Patient presents with   Follow-up    26mth follow-up   Urological history: 1. rUTI's -contributing factors of age, vaginal atrophy, diarrhea, incontinence and poor liquid intake  -documented positive urine cultures over the last year             E.coli pan-sensitive 04/23/2020             Klebsiella pneumoniae resistant to ampicillin 05/04/2020             Klebsiella pneumoniae resistant to ampicillin 09/23/2020             Klebsiella pneumoniae resistant to ampicillin 11/22/2020             Pseudomonas aeruginosa 12/29/2020             Pseudomonas aeruginosa 01/14/2021  Pseudomonas aeruginosa 01/14/2021       -RUS 2021 NED -RUS 2022 NED   2. High risk hematuria -non-smoker -CTU 2018  -cysto 2018 Mild trabeculation with a large wide mouth diverticulum involving the majority of the right lateral wall -urine cytology 2018 negative -no reports of gross heme   3. Mixed incontinence -contributing factors of age, vaginal atrophy, pelvic surgery and depression -managed with Myrbetriq 50 mg daily    4. Vaginal atrophy -managed with vaginal estrogen cream three nights weekly  HPI: Heidi Arias is a 87 y.o. female who presents today for 57-month follow-up after a course of suppressive Keflex, with her daughter, Heidi Arias.  Daughter had found patient on the floor when she got to the house to pick her mother up.  She believes that she may have tripped on her shoes.  She denies any loss of consciences.  She hit the left side of her head and shoulder.  She is able to move her shoulder.  She has bruising on her left eye.    She states she is confused after her fall.   She states her stomach hurts in the morning before she voids and it is relieved with urination.     PMH: Past Medical History:  Diagnosis  Date   Anxiety    Cancer ([place])    melanoma   Cancer ([place])    squamous cell   Endometriosis    GERD (gastroesophageal reflux disease)    History of stomach ulcers    Kidney failure    Recurrent UTI (urinary tract infection)    Rheumatoid arthritis ([place])     Surgical History: Past Surgical History:  Procedure Laterality Date   ABDOMINAL HYSTERECTOMY     APPENDECTOMY     CATARACT EXTRACTION     CHOLECYSTECTOMY     COLON SURGERY     ESOPHAGOGASTRODUODENOSCOPY (EGD) WITH PROPOFOL N/A 03/15/2016   Procedure: ESOPHAGOGASTRODUODENOSCOPY (EGD) WITH PROPOFOL;  Surgeon: Manya Silvas, MD;  Location: [facility];  Service: Endoscopy;  Laterality: N/A;   INNER EAR SURGERY     wears hearing aide   TONSILLECTOMY      Home Medications:  Allergies as of 03/14/2021       Reactions   Ciprofloxacin Hcl Rash   Codeine Sulfate    Other reaction(s): Unknown   Doxycycline Hyclate Rash   Penicillin G Rash   Sulfamethoxazole-trimethoprim Rash        Medication List        Accurate as of March 14, 2021  2:29 PM. If you have any questions, ask your nurse or doctor.          AZO URINARY PAIN PO Take by mouth as needed.   cephALEXin 250 MG capsule Commonly known as: Keflex Take 1 capsule (250 mg total) by mouth daily.   hydrOXYzine 25 MG tablet Commonly known as: ATARAX Take 25 mg by mouth daily.   mupirocin ointment 2 % Commonly known as: BACTROBAN   Myrbetriq 50 MG Tb24 tablet Generic drug: mirabegron ER TAKE (1) TABLET BY MOUTH EVERY DAY   omeprazole 40 MG capsule Commonly known as: PRILOSEC Take 1 capsule by mouth 2 (two) times daily. Take 30 minutes before breakfast and take 30 minutes before dinner.   Premarin vaginal cream Generic drug: conjugated estrogens Place 1 Applicatorful vaginally daily. Apply 0.5mg  (pea-sized amount)  just inside the vaginal introitus with a finger-tip on Monday, Wednesday and Friday nights.   sertraline 50 MG tablet Commonly  known as: ZOLOFT   VITAMIN C PO Take by mouth.   VITAMIN D3 PO Take by mouth.        Allergies:  Allergies  Allergen Reactions   Ciprofloxacin Hcl Rash   Codeine Sulfate     Other reaction(s): Unknown   Doxycycline Hyclate Rash   Penicillin G Rash   Sulfamethoxazole-Trimethoprim Rash    Family History: Family History  Problem Relation Age of Onset   CVA Father    Breast cancer Cousin        mat cousin   Prostate cancer Neg Hx    Kidney cancer Neg Hx    Kidney disease Neg Hx    Bladder Cancer Neg Hx     Social History:  reports that she has never smoked. She has never used smokeless tobacco. She reports that she does not drink alcohol and does not use drugs.  ROS: Pertinent ROS in HPI  Physical Exam: BP (!) 163/80    Pulse 90    Ht 5\' 1"  (1.549 m)    Wt 130 lb (59 kg)    BMI 24.56 kg/m   Constitutional:  Well nourished. Alert and oriented, No acute distress. HEENT: [place] AT, mask in place.  Trachea midline.  Left eye orbit with contusion and swelling.   Cardiovascular: No clubbing, cyanosis, or edema. Respiratory: Normal respiratory effort, no increased work of breathing. Neurologic: Grossly intact, no focal deficits, moving all 4 extremities. Psychiatric: Normal mood and affect.    Laboratory Data: N/A  Pertinent Imaging: N/A   Assessment & Plan:    1. Mechanical Fall -advised to seek treatment in the urgent care -Patient has been suffering with recurrent falls determined to be the result of loss of balance or tripping versus syncopal episodes -Physical therapy has been out of the house and evaluated the patient and have instructed her to use the walker -Heidi Arias states that the falls occur when she is not using the walker and has not had a fall while using the walker  2. Recurrent UTI -RUS no nidus for recurrent infections -Continue preventative strategies -We will schedule cystoscopy at this time  3. Lower abdominal pain -May be due to to a  distended bladder in the morning as it is relieved with urination -We will have repeat cystoscopy for further evaluation to rule out any CIS/bladder tumor that may be the source of her discomfort  3. Vaginal atrophy -continue applying the vaginal estrogen cream three nights weekly  4. Mixed incontinence -continue the Myrbetriq 50 mg  daily                                         Return for cysto for rUTI's with Dr. Erlene Quan .  These notes generated with voice recognition software. I apologize for typographical errors.  [name], PA-C  [facility] [address]  [place], [place] [address] [phone]

## 2021-03-14 ENCOUNTER — Encounter: Payer: Self-pay | Admitting: Emergency Medicine

## 2021-03-14 ENCOUNTER — Encounter: Payer: Self-pay | Admitting: Urology

## 2021-03-14 ENCOUNTER — Other Ambulatory Visit: Payer: Self-pay

## 2021-03-14 ENCOUNTER — Ambulatory Visit
Admission: EM | Admit: 2021-03-14 | Discharge: 2021-03-14 | Disposition: A | Discharge status: Home / Self Care | Payer: PPO

## 2021-03-14 ENCOUNTER — Ambulatory Visit: Payer: PPO | Admitting: Urology

## 2021-03-14 VITALS — BP 163/80 | HR 90 | Ht 61.0 in | Wt 130.0 lb

## 2021-03-14 DIAGNOSIS — N390 Urinary tract infection, site not specified: Secondary | ICD-10-CM | POA: Diagnosis not present

## 2021-03-14 DIAGNOSIS — R319 Hematuria, unspecified: Secondary | ICD-10-CM | POA: Diagnosis not present

## 2021-03-14 DIAGNOSIS — W19XXXA Unspecified fall, initial encounter: Secondary | ICD-10-CM

## 2021-03-14 DIAGNOSIS — S40022A Contusion of left upper arm, initial encounter: Secondary | ICD-10-CM

## 2021-03-14 DIAGNOSIS — N952 Postmenopausal atrophic vaginitis: Secondary | ICD-10-CM

## 2021-03-14 DIAGNOSIS — S0083XA Contusion of other part of head, initial encounter: Secondary | ICD-10-CM | POA: Diagnosis not present

## 2021-03-14 DIAGNOSIS — N3946 Mixed incontinence: Secondary | ICD-10-CM | POA: Diagnosis not present

## 2021-03-14 MED ORDER — CEPHALEXIN 250 MG PO CAPS: 250.0000 mg | ORAL_CAPSULE | Freq: Every day | ORAL | 3 refills | Status: DC

## 2021-03-14 NOTE — Discharge Instructions (Signed)
Apply ice on area of swelling and bruise for 15 minutes 3-5 times a day today and tomorrow. USE YOUR WALKER AT ALL TIMES   Follow up with your primary care doctor next week.   Someone needs to observe you tonight , if you develop vomiting, you must go to the ER

## 2021-03-14 NOTE — ED Provider Notes (Signed)
[facility]    CSN: [id number] Arrival date & time: 03/14/21  1455      History   Chief Complaint Chief Complaint  Patient presents with   Fall    HPI Heidi Arias is a 87 y.o. female who presents with her daughter due to pt falling at home and landed on her L face while wearing her glasses but they did not brake. Her daughter found her sitting on the floor leaning against the wall. Pt states she just tripped, and denies LOC. Her daughter went to pick her up to go to the urologist whom she saw, but he suggested to come here. Pt denies having much pain. States her L orbit is sore, and L upper arm. Pt apparently was not using her walker like she is supposed to.     Past Medical History:  Diagnosis Date   Anxiety    Cancer ([place])    melanoma   Cancer ([place])    squamous cell   Endometriosis    GERD (gastroesophageal reflux disease)    History of stomach ulcers    Kidney failure    Recurrent UTI (urinary tract infection)    Rheumatoid arthritis ([place])     Patient Active Problem List   Diagnosis Date Noted   Iron deficiency anemia 01/12/2016    Past Surgical History:  Procedure Laterality Date   ABDOMINAL HYSTERECTOMY     APPENDECTOMY     CATARACT EXTRACTION     CHOLECYSTECTOMY     COLON SURGERY     ESOPHAGOGASTRODUODENOSCOPY (EGD) WITH PROPOFOL N/A 03/15/2016   Procedure: ESOPHAGOGASTRODUODENOSCOPY (EGD) WITH PROPOFOL;  Surgeon: Manya Silvas, MD;  Location: [place];  Service: Endoscopy;  Laterality: N/A;   INNER EAR SURGERY     wears hearing aide   TONSILLECTOMY      OB History     Gravida  2   Para  2   Term  2   Preterm      AB      Living  2      SAB      IAB      Ectopic      Multiple      Live Births               Home Medications    Prior to Admission medications   Medication Sig Start Date End Date Taking? Authorizing Provider  cephALEXin (KEFLEX) 250 MG capsule Take 1 capsule (250 mg total) by  mouth daily. 03/14/21  Yes McGowan, Larene Beach A, PA-C  Ascorbic Acid (VITAMIN C PO) Take by mouth.    [provider]  Cholecalciferol (VITAMIN D3 PO) Take by mouth.    [provider]  conjugated estrogens (PREMARIN) vaginal cream Place 1 Applicatorful vaginally daily. Apply 0.5mg  (pea-sized amount)  just inside the vaginal introitus with a finger-tip on Monday, Wednesday and Friday nights. 12/13/18   Vaillancourt, Aldona Bar, PA-C  hydrOXYzine (ATARAX/VISTARIL) 25 MG tablet Take 25 mg by mouth daily. 10/28/20   [provider]  mupirocin ointment (BACTROBAN) 2 %  06/26/18   [provider]  MYRBETRIQ 50 MG TB24 tablet TAKE (1) TABLET BY MOUTH EVERY DAY 10/05/20   Hollice Espy, MD  omeprazole (PRILOSEC) 40 MG capsule Take 1 capsule by mouth 2 (two) times daily. Take 30 minutes before breakfast and take 30 minutes before dinner. 01/19/16   [provider]  Phenazopyridine HCl (AZO URINARY PAIN PO) Take by mouth as needed.  [provider]  sertraline (ZOLOFT) 50 MG tablet  05/13/15   [provider]    Family History Family History  Problem Relation Age of Onset   CVA Father    Breast cancer Cousin        mat cousin   Prostate cancer Neg Hx    Kidney cancer Neg Hx    Kidney disease Neg Hx    Bladder Cancer Neg Hx     Social History Social History   Tobacco Use   Smoking status: Never   Smokeless tobacco: Never  Substance Use Topics   Alcohol use: No   Drug use: No     Allergies   Ciprofloxacin hcl, Codeine sulfate, Doxycycline hyclate, Penicillin g, and Sulfamethoxazole-trimethoprim   Review of Systems Review of Systems  Eyes:  Negative for pain, redness and visual disturbance.  Respiratory:         Denies rib pain  Gastrointestinal:  Negative for abdominal pain.  Musculoskeletal:  Positive for gait problem. Negative for arthralgias, back pain, joint swelling, neck pain and neck stiffness.  Skin:  Positive for  color change. Negative for rash and wound.  Neurological:  Negative for dizziness, syncope, weakness and light-headedness.    Physical Exam Triage Vital Signs ED Triage Vitals  Enc Vitals Group     BP 03/14/21 1543 (!) 163/80     Pulse Rate 03/14/21 1543 90     Resp 03/14/21 1543 16     Temp 03/14/21 1543 98.9 F (37.2 C)     Temp Source 03/14/21 1543 Oral     SpO2 03/14/21 1543 99 %     Weight --      Height --      Head Circumference --      Peak Flow --      Pain Score 03/14/21 1539 6     Pain Loc --      Pain Edu? --      Excl. in [place]? --    No data found.  Updated Vital Signs BP (!) 163/80 (BP Location: Right Arm)    Pulse 90    Temp 98.9 F (37.2 C) (Oral)    Resp 16    SpO2 99%   Visual Acuity Right Eye Distance:   Left Eye Distance:   Bilateral Distance:    Right Eye Near:   Left Eye Near:    Bilateral Near:     Physical Exam Constitutional:      General: She is not in acute distress.    Appearance: Normal appearance. She is not toxic-appearing.  HENT:     Head:     Comments: L orbit with moderate soft tissue swelling on the upper outer area where she has a hematoma, but the bone on the orbit is not tender.     Right Ear: Tympanic membrane, ear canal and external ear normal.     Left Ear: Tympanic membrane, ear canal and external ear normal.     Ears:     Comments: She is hard of hearing and is wearing a hearing aid on the L    Nose: Nose normal.  Eyes:     General: No scleral icterus.    Extraocular Movements: Extraocular movements intact.     Conjunctiva/sclera: Conjunctivae normal.     Pupils: Pupils are equal, round, and reactive to light.  Cardiovascular:     Rate and Rhythm: Normal rate and regular rhythm.     Heart sounds: No murmur heard.  Pulmonary:     Effort: Pulmonary effort is normal.     Breath sounds: Normal breath sounds.  Chest:     Chest wall: No tenderness.  Musculoskeletal:        General: No swelling or deformity.      Cervical back: Neck supple. No rigidity or tenderness.     Comments: L SHOULDER- normal ROM and not tender L UPPEr arm- MILD SOFT  tissue tenderness with a faint bruise present on the mid anterior upper arm L ELBOW- with normal ROM, no bone pain.  L FOREARM AND WRIST AND HAND- NORMAL ROM WITH NO PAIN Knees- non tender Hips- normal ROM without pain  Skin:    General: Skin is warm and dry.     Findings: Bruising present. No erythema or rash.  Neurological:     Mental Status: She is alert and oriented to person, place, and time.     Motor: No weakness.  Psychiatric:        Mood and Affect: Mood normal.        Behavior: Behavior normal.        Judgment: Judgment normal.     UC Treatments / Results  Labs (all labs ordered are listed, but only abnormal results are displayed) Labs Reviewed - No data to display  EKG   Radiology No results found.  Procedures Procedures (including critical care time)  Medications Ordered in UC Medications - No data to display  Initial Impression / Assessment and Plan / UC Course  I have reviewed the triage vital signs and the nursing notes. Since she does not have any bone pain I did not order any xrays. May take Tylenol prn pain. See instructions.        Final Clinical Impressions(s) / UC Diagnoses   Final diagnoses:  Fall, initial encounter  Contusion of face, initial encounter  Contusion of left upper arm, initial encounter     Discharge Instructions      Apply ice on area of swelling and bruise for 15 minutes 3-5 times a day today and tomorrow. USE YOUR WALKER AT ALL TIMES   Follow up with your primary care doctor next week.   Someone needs to observe you tonight , if you develop vomiting, you must go to the ER     ED Prescriptions   None    PDMP not reviewed this encounter.   Shelby Mattocks, PA-C 03/14/21 1630

## 2021-03-14 NOTE — ED Triage Notes (Signed)
Pt presents today with daughter. Pt fell at home today around 12noon striking her face on wood floor. She also c/o left arm pain. She has bruising to left eye and soreness to left arm. Denies anticoagulants.

## 2021-03-22 DIAGNOSIS — N3941 Urge incontinence: Secondary | ICD-10-CM | POA: Diagnosis not present

## 2021-03-22 DIAGNOSIS — K5790 Diverticulosis of intestine, part unspecified, without perforation or abscess without bleeding: Secondary | ICD-10-CM | POA: Diagnosis not present

## 2021-03-22 DIAGNOSIS — M545 Low back pain, unspecified: Secondary | ICD-10-CM | POA: Diagnosis not present

## 2021-03-22 DIAGNOSIS — Z8616 Personal history of COVID-19: Secondary | ICD-10-CM | POA: Diagnosis not present

## 2021-03-22 DIAGNOSIS — D509 Iron deficiency anemia, unspecified: Secondary | ICD-10-CM | POA: Diagnosis not present

## 2021-03-22 DIAGNOSIS — K219 Gastro-esophageal reflux disease without esophagitis: Secondary | ICD-10-CM | POA: Diagnosis not present

## 2021-03-22 DIAGNOSIS — N183 Chronic kidney disease, stage 3 unspecified: Secondary | ICD-10-CM | POA: Diagnosis not present

## 2021-03-22 DIAGNOSIS — E1122 Type 2 diabetes mellitus with diabetic chronic kidney disease: Secondary | ICD-10-CM | POA: Diagnosis not present

## 2021-03-22 DIAGNOSIS — E559 Vitamin D deficiency, unspecified: Secondary | ICD-10-CM | POA: Diagnosis not present

## 2021-03-22 DIAGNOSIS — M1990 Unspecified osteoarthritis, unspecified site: Secondary | ICD-10-CM | POA: Diagnosis not present

## 2021-03-22 DIAGNOSIS — M479 Spondylosis, unspecified: Secondary | ICD-10-CM | POA: Diagnosis not present

## 2021-03-22 DIAGNOSIS — N390 Urinary tract infection, site not specified: Secondary | ICD-10-CM | POA: Diagnosis not present

## 2021-03-22 DIAGNOSIS — R296 Repeated falls: Secondary | ICD-10-CM | POA: Diagnosis not present

## 2021-03-22 DIAGNOSIS — Z9181 History of falling: Secondary | ICD-10-CM | POA: Diagnosis not present

## 2021-03-22 DIAGNOSIS — B965 Pseudomonas (aeruginosa) (mallei) (pseudomallei) as the cause of diseases classified elsewhere: Secondary | ICD-10-CM | POA: Diagnosis not present

## 2021-03-22 DIAGNOSIS — F419 Anxiety disorder, unspecified: Secondary | ICD-10-CM | POA: Diagnosis not present

## 2021-03-22 DIAGNOSIS — Z85828 Personal history of other malignant neoplasm of skin: Secondary | ICD-10-CM | POA: Diagnosis not present

## 2021-03-22 DIAGNOSIS — H9193 Unspecified hearing loss, bilateral: Secondary | ICD-10-CM | POA: Diagnosis not present

## 2021-03-22 DIAGNOSIS — Z87891 Personal history of nicotine dependence: Secondary | ICD-10-CM | POA: Diagnosis not present

## 2021-03-22 DIAGNOSIS — G8929 Other chronic pain: Secondary | ICD-10-CM | POA: Diagnosis not present

## 2021-03-23 DIAGNOSIS — E1122 Type 2 diabetes mellitus with diabetic chronic kidney disease: Secondary | ICD-10-CM | POA: Diagnosis not present

## 2021-03-23 DIAGNOSIS — Z87891 Personal history of nicotine dependence: Secondary | ICD-10-CM | POA: Diagnosis not present

## 2021-03-23 DIAGNOSIS — N183 Chronic kidney disease, stage 3 unspecified: Secondary | ICD-10-CM | POA: Diagnosis not present

## 2021-03-23 DIAGNOSIS — K219 Gastro-esophageal reflux disease without esophagitis: Secondary | ICD-10-CM | POA: Diagnosis not present

## 2021-03-23 DIAGNOSIS — G8929 Other chronic pain: Secondary | ICD-10-CM | POA: Diagnosis not present

## 2021-03-23 DIAGNOSIS — M1990 Unspecified osteoarthritis, unspecified site: Secondary | ICD-10-CM | POA: Diagnosis not present

## 2021-03-23 DIAGNOSIS — N390 Urinary tract infection, site not specified: Secondary | ICD-10-CM | POA: Diagnosis not present

## 2021-03-23 DIAGNOSIS — E559 Vitamin D deficiency, unspecified: Secondary | ICD-10-CM | POA: Diagnosis not present

## 2021-03-23 DIAGNOSIS — M545 Low back pain, unspecified: Secondary | ICD-10-CM | POA: Diagnosis not present

## 2021-03-23 DIAGNOSIS — Z85828 Personal history of other malignant neoplasm of skin: Secondary | ICD-10-CM | POA: Diagnosis not present

## 2021-03-23 DIAGNOSIS — K5790 Diverticulosis of intestine, part unspecified, without perforation or abscess without bleeding: Secondary | ICD-10-CM | POA: Diagnosis not present

## 2021-03-23 DIAGNOSIS — D509 Iron deficiency anemia, unspecified: Secondary | ICD-10-CM | POA: Diagnosis not present

## 2021-03-23 DIAGNOSIS — R296 Repeated falls: Secondary | ICD-10-CM | POA: Diagnosis not present

## 2021-03-23 DIAGNOSIS — M479 Spondylosis, unspecified: Secondary | ICD-10-CM | POA: Diagnosis not present

## 2021-03-23 DIAGNOSIS — N3941 Urge incontinence: Secondary | ICD-10-CM | POA: Diagnosis not present

## 2021-03-23 DIAGNOSIS — H9193 Unspecified hearing loss, bilateral: Secondary | ICD-10-CM | POA: Diagnosis not present

## 2021-03-23 DIAGNOSIS — B965 Pseudomonas (aeruginosa) (mallei) (pseudomallei) as the cause of diseases classified elsewhere: Secondary | ICD-10-CM | POA: Diagnosis not present

## 2021-03-23 DIAGNOSIS — F419 Anxiety disorder, unspecified: Secondary | ICD-10-CM | POA: Diagnosis not present

## 2021-03-23 DIAGNOSIS — Z8616 Personal history of COVID-19: Secondary | ICD-10-CM | POA: Diagnosis not present

## 2021-03-23 DIAGNOSIS — Z9181 History of falling: Secondary | ICD-10-CM | POA: Diagnosis not present

## 2021-03-25 DIAGNOSIS — K5790 Diverticulosis of intestine, part unspecified, without perforation or abscess without bleeding: Secondary | ICD-10-CM | POA: Diagnosis not present

## 2021-03-25 DIAGNOSIS — Z9181 History of falling: Secondary | ICD-10-CM | POA: Diagnosis not present

## 2021-03-25 DIAGNOSIS — G8929 Other chronic pain: Secondary | ICD-10-CM | POA: Diagnosis not present

## 2021-03-25 DIAGNOSIS — N183 Chronic kidney disease, stage 3 unspecified: Secondary | ICD-10-CM | POA: Diagnosis not present

## 2021-03-25 DIAGNOSIS — R296 Repeated falls: Secondary | ICD-10-CM | POA: Diagnosis not present

## 2021-03-25 DIAGNOSIS — M545 Low back pain, unspecified: Secondary | ICD-10-CM | POA: Diagnosis not present

## 2021-03-25 DIAGNOSIS — E1122 Type 2 diabetes mellitus with diabetic chronic kidney disease: Secondary | ICD-10-CM | POA: Diagnosis not present

## 2021-03-25 DIAGNOSIS — F419 Anxiety disorder, unspecified: Secondary | ICD-10-CM | POA: Diagnosis not present

## 2021-03-25 DIAGNOSIS — H9193 Unspecified hearing loss, bilateral: Secondary | ICD-10-CM | POA: Diagnosis not present

## 2021-03-25 DIAGNOSIS — Z87891 Personal history of nicotine dependence: Secondary | ICD-10-CM | POA: Diagnosis not present

## 2021-03-25 DIAGNOSIS — E559 Vitamin D deficiency, unspecified: Secondary | ICD-10-CM | POA: Diagnosis not present

## 2021-03-25 DIAGNOSIS — D509 Iron deficiency anemia, unspecified: Secondary | ICD-10-CM | POA: Diagnosis not present

## 2021-03-25 DIAGNOSIS — Z87440 Personal history of urinary (tract) infections: Secondary | ICD-10-CM | POA: Diagnosis not present

## 2021-03-25 DIAGNOSIS — M479 Spondylosis, unspecified: Secondary | ICD-10-CM | POA: Diagnosis not present

## 2021-03-25 DIAGNOSIS — K219 Gastro-esophageal reflux disease without esophagitis: Secondary | ICD-10-CM | POA: Diagnosis not present

## 2021-03-25 DIAGNOSIS — N3941 Urge incontinence: Secondary | ICD-10-CM | POA: Diagnosis not present

## 2021-03-25 DIAGNOSIS — C439 Malignant melanoma of skin, unspecified: Secondary | ICD-10-CM | POA: Diagnosis not present

## 2021-03-25 DIAGNOSIS — M1990 Unspecified osteoarthritis, unspecified site: Secondary | ICD-10-CM | POA: Diagnosis not present

## 2021-03-28 DIAGNOSIS — Z87891 Personal history of nicotine dependence: Secondary | ICD-10-CM | POA: Diagnosis not present

## 2021-03-28 DIAGNOSIS — M1990 Unspecified osteoarthritis, unspecified site: Secondary | ICD-10-CM | POA: Diagnosis not present

## 2021-03-28 DIAGNOSIS — M545 Low back pain, unspecified: Secondary | ICD-10-CM | POA: Diagnosis not present

## 2021-03-28 DIAGNOSIS — E559 Vitamin D deficiency, unspecified: Secondary | ICD-10-CM | POA: Diagnosis not present

## 2021-03-28 DIAGNOSIS — N183 Chronic kidney disease, stage 3 unspecified: Secondary | ICD-10-CM | POA: Diagnosis not present

## 2021-03-28 DIAGNOSIS — R296 Repeated falls: Secondary | ICD-10-CM | POA: Diagnosis not present

## 2021-03-28 DIAGNOSIS — E1122 Type 2 diabetes mellitus with diabetic chronic kidney disease: Secondary | ICD-10-CM | POA: Diagnosis not present

## 2021-03-28 DIAGNOSIS — M479 Spondylosis, unspecified: Secondary | ICD-10-CM | POA: Diagnosis not present

## 2021-03-28 DIAGNOSIS — K5790 Diverticulosis of intestine, part unspecified, without perforation or abscess without bleeding: Secondary | ICD-10-CM | POA: Diagnosis not present

## 2021-03-28 DIAGNOSIS — H9193 Unspecified hearing loss, bilateral: Secondary | ICD-10-CM | POA: Diagnosis not present

## 2021-03-28 DIAGNOSIS — N3941 Urge incontinence: Secondary | ICD-10-CM | POA: Diagnosis not present

## 2021-03-28 DIAGNOSIS — Z87440 Personal history of urinary (tract) infections: Secondary | ICD-10-CM | POA: Diagnosis not present

## 2021-03-28 DIAGNOSIS — F419 Anxiety disorder, unspecified: Secondary | ICD-10-CM | POA: Diagnosis not present

## 2021-03-28 DIAGNOSIS — C439 Malignant melanoma of skin, unspecified: Secondary | ICD-10-CM | POA: Diagnosis not present

## 2021-03-28 DIAGNOSIS — K219 Gastro-esophageal reflux disease without esophagitis: Secondary | ICD-10-CM | POA: Diagnosis not present

## 2021-03-28 DIAGNOSIS — Z9181 History of falling: Secondary | ICD-10-CM | POA: Diagnosis not present

## 2021-03-28 DIAGNOSIS — G8929 Other chronic pain: Secondary | ICD-10-CM | POA: Diagnosis not present

## 2021-03-28 DIAGNOSIS — D509 Iron deficiency anemia, unspecified: Secondary | ICD-10-CM | POA: Diagnosis not present

## 2021-03-30 ENCOUNTER — Encounter: Payer: Self-pay | Admitting: Urology

## 2021-03-30 ENCOUNTER — Ambulatory Visit: Payer: PPO | Admitting: Urology

## 2021-03-30 ENCOUNTER — Other Ambulatory Visit: Payer: Self-pay

## 2021-03-30 VITALS — BP 116/71 | HR 80 | Ht 62.0 in | Wt 135.0 lb

## 2021-03-30 DIAGNOSIS — N390 Urinary tract infection, site not specified: Secondary | ICD-10-CM | POA: Diagnosis not present

## 2021-03-30 LAB — URINALYSIS, COMPLETE
Bilirubin, UA: NEGATIVE
Glucose, UA: NEGATIVE
Ketones, UA: NEGATIVE
Nitrite, UA: NEGATIVE
Protein,UA: NEGATIVE
Specific Gravity, UA: 1.02 (ref 1.005–1.030)
Urobilinogen, Ur: 0.2 mg/dL (ref 0.2–1.0)
pH, UA: 7 (ref 5.0–7.5)

## 2021-03-30 LAB — MICROSCOPIC EXAMINATION

## 2021-03-30 MED ORDER — CEPHALEXIN 500 MG PO CAPS: 500.0000 mg | ORAL_CAPSULE | Freq: Two times a day (BID) | ORAL | 0 refills | Status: DC

## 2021-03-30 NOTE — Progress Notes (Signed)
In and Out Catheterization  Patient is present today for a I & O catheterization due to dysuria. Patient was cleaned and prepped in a sterile fashion with betadine . A 14FR cath was inserted no complications were noted , 220mL of urine return was noted, urine was cloudy yellow in color. A clean urine sample was collected for urinalysis. Bladder was drained  And catheter was removed with out difficulty.    Performed by: Bradly Bienenstock CMA

## 2021-03-30 NOTE — Progress Notes (Signed)
03/30/2021 6:43 PM   Heidi Arias 06-22-1931 [id number]  Referring provider: Sallee Lange, NP [address] [place],  [place] [address]  Chief Complaint  Patient presents with   Recurrent UTI   Urological history: 1. rUTI's -contributing factors of age, vaginal atrophy, diarrhea, incontinence and poor liquid intake  -documented positive urine cultures over the last year             E.coli pan-sensitive 04/23/2020             Klebsiella pneumoniae resistant to ampicillin 05/04/2020             Klebsiella pneumoniae resistant to ampicillin 09/23/2020             Klebsiella pneumoniae resistant to ampicillin 11/22/2020             Pseudomonas aeruginosa 12/29/2020             Pseudomonas aeruginosa 01/14/2021  Pseudomonas aeruginosa 01/14/2021       -RUS 2021 NED -RUS 2022 NED   2. High risk hematuria -non-smoker -CTU 2018  -cysto 2018 Mild trabeculation with a large wide mouth diverticulum involving the majority of the right lateral wall -urine cytology 2018 negative -no reports of gross heme   3. Mixed incontinence -contributing factors of age, vaginal atrophy, pelvic surgery and depression -managed with Myrbetriq 50 mg daily    4. Vaginal atrophy -managed with vaginal estrogen cream three nights weekly  HPI: Heidi Arias is a 88 y.o. female who presents today for back pain and odor with her daughter, [name].  She been having feelings of malaise, dysuria and suprapubic pressure that started over the Holiday week.  Patient denies any modifying or aggravating factors.  Patient denies any gross hematuria, dysuria or suprapubic/flank pain.  Patient denies any fevers, chills, nausea or vomiting.    CATH UA 11-30 WBCs and many bacteria  PVR 240 mL  [name] feels that this UTI was precipitated by an incident of poor perineal cleaning after an issue with constipation.  She saw stool up in the vaginal and urethral area with her mother as she was  helping to clean her.  This happened two weeks ago.   PMH: Past Medical History:  Diagnosis Date   Anxiety    Cancer ([place])    melanoma   Cancer ([place])    squamous cell   Endometriosis    GERD (gastroesophageal reflux disease)    History of stomach ulcers    Kidney failure    Recurrent UTI (urinary tract infection)    Rheumatoid arthritis ([place])     Surgical History: Past Surgical History:  Procedure Laterality Date   ABDOMINAL HYSTERECTOMY     APPENDECTOMY     CATARACT EXTRACTION     CHOLECYSTECTOMY     COLON SURGERY     ESOPHAGOGASTRODUODENOSCOPY (EGD) WITH PROPOFOL N/A 03/15/2016   Procedure: ESOPHAGOGASTRODUODENOSCOPY (EGD) WITH PROPOFOL;  Surgeon: Manya Silvas, MD;  Location: [place];  Service: Endoscopy;  Laterality: N/A;   INNER EAR SURGERY     wears hearing aide   TONSILLECTOMY      Home Medications:  Allergies as of 03/30/2021       Reactions   Ciprofloxacin Hcl Rash   Codeine Sulfate    Other reaction(s): Unknown   Doxycycline Hyclate Rash   Penicillin G Rash   Sulfamethoxazole-trimethoprim Rash        Medication List        Accurate as of  March 30, 2021  6:43 PM. If you have any questions, ask your nurse or doctor.          AZO URINARY PAIN PO Take by mouth as needed.   cephALEXin 500 MG capsule Commonly known as: Keflex Take 1 capsule (500 mg total) by mouth 2 (two) times daily. What changed:  medication strength how much to take when to take this Changed by: Shayon Trompeter, PA-C   hydrOXYzine 25 MG tablet Commonly known as: ATARAX Take 25 mg by mouth daily.   mupirocin ointment 2 % Commonly known as: BACTROBAN   Myrbetriq 50 MG Tb24 tablet Generic drug: mirabegron ER TAKE (1) TABLET BY MOUTH EVERY DAY   omeprazole 40 MG capsule Commonly known as: PRILOSEC Take 1 capsule by mouth 2 (two) times daily. Take 30 minutes before breakfast and take 30 minutes before dinner.   Premarin vaginal cream Generic drug:  conjugated estrogens Place 1 Applicatorful vaginally daily. Apply 0.5mg  (pea-sized amount)  just inside the vaginal introitus with a finger-tip on Monday, Wednesday and Friday nights.   sertraline 50 MG tablet Commonly known as: ZOLOFT   VITAMIN C PO Take by mouth.   VITAMIN D3 PO Take by mouth.        Allergies:  Allergies  Allergen Reactions   Ciprofloxacin Hcl Rash   Codeine Sulfate     Other reaction(s): Unknown   Doxycycline Hyclate Rash   Penicillin G Rash   Sulfamethoxazole-Trimethoprim Rash    Family History: Family History  Problem Relation Age of Onset   CVA Father    Breast cancer Cousin        mat cousin   Prostate cancer Neg Hx    Kidney cancer Neg Hx    Kidney disease Neg Hx    Bladder Cancer Neg Hx     Social History:  reports that she has never smoked. She has never used smokeless tobacco. She reports that she does not drink alcohol and does not use drugs.  ROS: Pertinent ROS in HPI  Physical Exam: BP 116/71    Pulse 80    Ht 5\' 2"  (1.575 m)    Wt 135 lb (61.2 kg)    BMI 24.69 kg/m   Constitutional:  Well nourished. Alert and oriented, No acute distress. HEENT: [place] AT, mask in place.  Trachea midline Cardiovascular: No clubbing, cyanosis, or edema. Respiratory: Normal respiratory effort, no increased work of breathing. Neurologic: Grossly intact, no focal deficits, moving all 4 extremities. Psychiatric: Normal mood and affect.    Laboratory Data: Results for orders placed or performed in visit on 03/30/21  Microscopic Examination   Urine  Result Value Ref Range   WBC, UA 11-30 (A) 0 - 5 /hpf   RBC 0-2 0 - 2 /hpf   Epithelial Cells (non renal) 0-10 0 - 10 /hpf   Bacteria, UA Many (A) None seen/Few  Urinalysis, Complete  Result Value Ref Range   Specific Gravity, UA 1.020 1.005 - 1.030   pH, UA 7.0 5.0 - 7.5   Color, UA Yellow Yellow   Appearance Ur Cloudy (A) Clear   Leukocytes,UA 1+ (A) Negative   Protein,UA Negative Negative/Trace    Glucose, UA Negative Negative   Ketones, UA Negative Negative   RBC, UA 1+ (A) Negative   Bilirubin, UA Negative Negative   Urobilinogen, Ur 0.2 0.2 - 1.0 mg/dL   Nitrite, UA Negative Negative   Microscopic Examination See below:      Pertinent Imaging: N/A  Assessment & Plan:    1. Recurrent UTI -Cath UA suspicious for infection -urine sent for culture -likely precipitated by contaminate of urethra by stool -RUS no nidus for recurrent infections -Continue preventative strategies -cysto pending -Start Keflex 500 mg twice daily until culture results are available  3. Lower abdominal pain -May be due to to a distended bladder in the morning as it is relieved with urination -We will have repeat cystoscopy for further evaluation to rule out any CIS/bladder tumor that may be the source of her discomfort  3. Vaginal atrophy -continue applying the vaginal estrogen cream three nights weekly  4. Mixed incontinence -continue the Myrbetriq 50 mg daily                                         Return for Keep appointment for cystoscopy in February.  These notes generated with voice recognition software. I apologize for typographical errors.  [name], PA-C  [facility] [address]  [place], [place] [address] [phone]

## 2021-04-01 DIAGNOSIS — S4992XA Unspecified injury of left shoulder and upper arm, initial encounter: Secondary | ICD-10-CM | POA: Diagnosis not present

## 2021-04-01 DIAGNOSIS — S3992XA Unspecified injury of lower back, initial encounter: Secondary | ICD-10-CM | POA: Diagnosis not present

## 2021-04-01 DIAGNOSIS — S79912A Unspecified injury of left hip, initial encounter: Secondary | ICD-10-CM | POA: Diagnosis not present

## 2021-04-01 DIAGNOSIS — M1612 Unilateral primary osteoarthritis, left hip: Secondary | ICD-10-CM | POA: Diagnosis not present

## 2021-04-01 DIAGNOSIS — Y92009 Unspecified place in unspecified non-institutional (private) residence as the place of occurrence of the external cause: Secondary | ICD-10-CM | POA: Diagnosis not present

## 2021-04-01 DIAGNOSIS — M5136 Other intervertebral disc degeneration, lumbar region: Secondary | ICD-10-CM | POA: Diagnosis not present

## 2021-04-01 DIAGNOSIS — R58 Hemorrhage, not elsewhere classified: Secondary | ICD-10-CM | POA: Diagnosis not present

## 2021-04-01 DIAGNOSIS — W010XXA Fall on same level from slipping, tripping and stumbling without subsequent striking against object, initial encounter: Secondary | ICD-10-CM | POA: Diagnosis not present

## 2021-04-01 LAB — CULTURE, URINE COMPREHENSIVE

## 2021-04-03 ENCOUNTER — Other Ambulatory Visit: Payer: Self-pay | Admitting: Urology

## 2021-04-03 MED ORDER — NITROFURANTOIN MONOHYD MACRO 100 MG PO CAPS: 100.0000 mg | ORAL_CAPSULE | Freq: Two times a day (BID) | ORAL | 0 refills | Status: DC

## 2021-04-04 DIAGNOSIS — M1990 Unspecified osteoarthritis, unspecified site: Secondary | ICD-10-CM | POA: Diagnosis not present

## 2021-04-04 DIAGNOSIS — R296 Repeated falls: Secondary | ICD-10-CM | POA: Diagnosis not present

## 2021-04-04 DIAGNOSIS — M545 Low back pain, unspecified: Secondary | ICD-10-CM | POA: Diagnosis not present

## 2021-04-04 DIAGNOSIS — Z9181 History of falling: Secondary | ICD-10-CM | POA: Diagnosis not present

## 2021-04-04 DIAGNOSIS — H9193 Unspecified hearing loss, bilateral: Secondary | ICD-10-CM | POA: Diagnosis not present

## 2021-04-04 DIAGNOSIS — K5790 Diverticulosis of intestine, part unspecified, without perforation or abscess without bleeding: Secondary | ICD-10-CM | POA: Diagnosis not present

## 2021-04-04 DIAGNOSIS — Z87440 Personal history of urinary (tract) infections: Secondary | ICD-10-CM | POA: Diagnosis not present

## 2021-04-04 DIAGNOSIS — G8929 Other chronic pain: Secondary | ICD-10-CM | POA: Diagnosis not present

## 2021-04-04 DIAGNOSIS — E559 Vitamin D deficiency, unspecified: Secondary | ICD-10-CM | POA: Diagnosis not present

## 2021-04-04 DIAGNOSIS — M479 Spondylosis, unspecified: Secondary | ICD-10-CM | POA: Diagnosis not present

## 2021-04-04 DIAGNOSIS — Z87891 Personal history of nicotine dependence: Secondary | ICD-10-CM | POA: Diagnosis not present

## 2021-04-04 DIAGNOSIS — F419 Anxiety disorder, unspecified: Secondary | ICD-10-CM | POA: Diagnosis not present

## 2021-04-04 DIAGNOSIS — E1122 Type 2 diabetes mellitus with diabetic chronic kidney disease: Secondary | ICD-10-CM | POA: Diagnosis not present

## 2021-04-04 DIAGNOSIS — N3941 Urge incontinence: Secondary | ICD-10-CM | POA: Diagnosis not present

## 2021-04-04 DIAGNOSIS — D509 Iron deficiency anemia, unspecified: Secondary | ICD-10-CM | POA: Diagnosis not present

## 2021-04-04 DIAGNOSIS — K219 Gastro-esophageal reflux disease without esophagitis: Secondary | ICD-10-CM | POA: Diagnosis not present

## 2021-04-04 DIAGNOSIS — N183 Chronic kidney disease, stage 3 unspecified: Secondary | ICD-10-CM | POA: Diagnosis not present

## 2021-04-04 DIAGNOSIS — C439 Malignant melanoma of skin, unspecified: Secondary | ICD-10-CM | POA: Diagnosis not present

## 2021-04-11 DIAGNOSIS — M545 Low back pain, unspecified: Secondary | ICD-10-CM | POA: Diagnosis not present

## 2021-04-11 DIAGNOSIS — M25551 Pain in right hip: Secondary | ICD-10-CM | POA: Diagnosis not present

## 2021-04-19 ENCOUNTER — Other Ambulatory Visit: Payer: Self-pay | Admitting: Physician Assistant

## 2021-04-19 DIAGNOSIS — M4807 Spinal stenosis, lumbosacral region: Secondary | ICD-10-CM

## 2021-04-20 DIAGNOSIS — M1990 Unspecified osteoarthritis, unspecified site: Secondary | ICD-10-CM | POA: Diagnosis not present

## 2021-04-20 DIAGNOSIS — E1122 Type 2 diabetes mellitus with diabetic chronic kidney disease: Secondary | ICD-10-CM | POA: Diagnosis not present

## 2021-04-20 DIAGNOSIS — R296 Repeated falls: Secondary | ICD-10-CM | POA: Diagnosis not present

## 2021-04-20 DIAGNOSIS — E559 Vitamin D deficiency, unspecified: Secondary | ICD-10-CM | POA: Diagnosis not present

## 2021-04-20 DIAGNOSIS — K5790 Diverticulosis of intestine, part unspecified, without perforation or abscess without bleeding: Secondary | ICD-10-CM | POA: Diagnosis not present

## 2021-04-20 DIAGNOSIS — G8929 Other chronic pain: Secondary | ICD-10-CM | POA: Diagnosis not present

## 2021-04-20 DIAGNOSIS — N183 Chronic kidney disease, stage 3 unspecified: Secondary | ICD-10-CM | POA: Diagnosis not present

## 2021-04-20 DIAGNOSIS — D509 Iron deficiency anemia, unspecified: Secondary | ICD-10-CM | POA: Diagnosis not present

## 2021-04-20 DIAGNOSIS — Z9181 History of falling: Secondary | ICD-10-CM | POA: Diagnosis not present

## 2021-04-20 DIAGNOSIS — N3941 Urge incontinence: Secondary | ICD-10-CM | POA: Diagnosis not present

## 2021-04-20 DIAGNOSIS — H9193 Unspecified hearing loss, bilateral: Secondary | ICD-10-CM | POA: Diagnosis not present

## 2021-04-20 DIAGNOSIS — Z87440 Personal history of urinary (tract) infections: Secondary | ICD-10-CM | POA: Diagnosis not present

## 2021-04-20 DIAGNOSIS — F419 Anxiety disorder, unspecified: Secondary | ICD-10-CM | POA: Diagnosis not present

## 2021-04-20 DIAGNOSIS — M545 Low back pain, unspecified: Secondary | ICD-10-CM | POA: Diagnosis not present

## 2021-04-20 DIAGNOSIS — K219 Gastro-esophageal reflux disease without esophagitis: Secondary | ICD-10-CM | POA: Diagnosis not present

## 2021-04-20 DIAGNOSIS — Z87891 Personal history of nicotine dependence: Secondary | ICD-10-CM | POA: Diagnosis not present

## 2021-04-20 DIAGNOSIS — M479 Spondylosis, unspecified: Secondary | ICD-10-CM | POA: Diagnosis not present

## 2021-04-20 DIAGNOSIS — C439 Malignant melanoma of skin, unspecified: Secondary | ICD-10-CM | POA: Diagnosis not present

## 2021-04-27 ENCOUNTER — Other Ambulatory Visit: Payer: Self-pay

## 2021-04-27 ENCOUNTER — Ambulatory Visit
Admission: RE | Admit: 2021-04-27 | Discharge: 2021-04-27 | Disposition: A | Discharge status: Home / Self Care | Payer: PPO | Source: Ambulatory Visit | Attending: Physician Assistant | Admitting: Physician Assistant

## 2021-04-27 DIAGNOSIS — M4807 Spinal stenosis, lumbosacral region: Secondary | ICD-10-CM | POA: Diagnosis not present

## 2021-04-27 DIAGNOSIS — M48061 Spinal stenosis, lumbar region without neurogenic claudication: Secondary | ICD-10-CM | POA: Diagnosis not present

## 2021-04-27 DIAGNOSIS — M47816 Spondylosis without myelopathy or radiculopathy, lumbar region: Secondary | ICD-10-CM | POA: Diagnosis not present

## 2021-05-02 DIAGNOSIS — N3941 Urge incontinence: Secondary | ICD-10-CM | POA: Diagnosis not present

## 2021-05-02 DIAGNOSIS — H9193 Unspecified hearing loss, bilateral: Secondary | ICD-10-CM | POA: Diagnosis not present

## 2021-05-02 DIAGNOSIS — K5790 Diverticulosis of intestine, part unspecified, without perforation or abscess without bleeding: Secondary | ICD-10-CM | POA: Diagnosis not present

## 2021-05-02 DIAGNOSIS — Z87891 Personal history of nicotine dependence: Secondary | ICD-10-CM | POA: Diagnosis not present

## 2021-05-02 DIAGNOSIS — D509 Iron deficiency anemia, unspecified: Secondary | ICD-10-CM | POA: Diagnosis not present

## 2021-05-02 DIAGNOSIS — M479 Spondylosis, unspecified: Secondary | ICD-10-CM | POA: Diagnosis not present

## 2021-05-02 DIAGNOSIS — E559 Vitamin D deficiency, unspecified: Secondary | ICD-10-CM | POA: Diagnosis not present

## 2021-05-02 DIAGNOSIS — K219 Gastro-esophageal reflux disease without esophagitis: Secondary | ICD-10-CM | POA: Diagnosis not present

## 2021-05-02 DIAGNOSIS — Z87440 Personal history of urinary (tract) infections: Secondary | ICD-10-CM | POA: Diagnosis not present

## 2021-05-02 DIAGNOSIS — R296 Repeated falls: Secondary | ICD-10-CM | POA: Diagnosis not present

## 2021-05-02 DIAGNOSIS — N183 Chronic kidney disease, stage 3 unspecified: Secondary | ICD-10-CM | POA: Diagnosis not present

## 2021-05-02 DIAGNOSIS — Z9181 History of falling: Secondary | ICD-10-CM | POA: Diagnosis not present

## 2021-05-02 DIAGNOSIS — F419 Anxiety disorder, unspecified: Secondary | ICD-10-CM | POA: Diagnosis not present

## 2021-05-02 DIAGNOSIS — C439 Malignant melanoma of skin, unspecified: Secondary | ICD-10-CM | POA: Diagnosis not present

## 2021-05-02 DIAGNOSIS — E1122 Type 2 diabetes mellitus with diabetic chronic kidney disease: Secondary | ICD-10-CM | POA: Diagnosis not present

## 2021-05-02 DIAGNOSIS — M545 Low back pain, unspecified: Secondary | ICD-10-CM | POA: Diagnosis not present

## 2021-05-02 DIAGNOSIS — G8929 Other chronic pain: Secondary | ICD-10-CM | POA: Diagnosis not present

## 2021-05-02 DIAGNOSIS — M1990 Unspecified osteoarthritis, unspecified site: Secondary | ICD-10-CM | POA: Diagnosis not present

## 2021-05-03 ENCOUNTER — Other Ambulatory Visit (HOSPITAL_COMMUNITY): Payer: Self-pay | Admitting: Physician Assistant

## 2021-05-03 ENCOUNTER — Other Ambulatory Visit: Payer: Self-pay | Admitting: Physician Assistant

## 2021-05-03 DIAGNOSIS — M8448XA Pathological fracture, other site, initial encounter for fracture: Secondary | ICD-10-CM

## 2021-05-05 NOTE — Progress Notes (Signed)
° °  05/06/21  CC:  Chief Complaint  Patient presents with   Recurrent UTI     HPI: Heidi Arias is a 88 y.o.female with a personal history of rUTIs, high risk hematuria, mixed incontinence, and vaginal atrophy, who presents today for diagnostic cystoscopy.   CTU in 2018 was unremarkable for hematuria. She underwent a cystoscopy that showed mild trabeculation with a large wide mouth diverticulum involving the majority of the right lateral wall. Urine cytology 2018 negative.   She has 7 document UTI in 2022 as below. RUS in 2022 showed NED. Her most recent UTI grew E.coli on 03/30/2021.   She is managed on vaginal estrogen cream for vaginal atrophy and myrbetriq for incontinence.   She is now on daily Macrobid for UTI prophylaxis started in January.  Her daughter also mentions today that her mental status has been waxing and waning at times.  She has a lower extremity edema is seeing his primary care physician mid next week, think she may need Lasix.  UTI trend:  E.coli pan-sensitive 04/23/2020 Klebsiella pneumoniae resistant to ampicillin 05/04/2020 Klebsiella pneumoniae resistant to ampicillin 09/23/2020 Klebsiella pneumoniae resistant to ampicillin 11/22/2020 Pseudomonas aeruginosa 12/29/2020 Pseudomonas aeruginosa 01/14/2021  Pseudomonas aeruginosa  01/24/2021               E.coli 03/27/2021    Vitals:   05/06/21 1424  BP: (!) 142/92  Pulse: 52   NED. A&Ox3.   No respiratory distress   Abd soft, NT, ND Normal external genitalia with patent urethral meatus  Cystoscopy Procedure Note  Patient identification was confirmed, informed consent was obtained, and patient was prepped using Betadine solution.  Lidocaine jelly was administered per urethral meatus.    Procedure: - Flexible cystoscope introduced, without any difficulty.   - Thorough search of the bladder revealed:    normal urethral meatus    normal urothelium    no stones    no ulcers     no tumors     no urethral polyps    Large widemouth diverticulum, stable  - Ureteral orifices were normal in position and appearance.  Post-Procedure: - Patient tolerated the procedure well  Assessment/ Plan:  1. Recurrent UTI Cystoscopy today fairly unremarkable, bladder does not appear to be inflamed without tumors or lesions  Continue previously aforementioned UTI management  Agree with PCP follow-up, does appear that she is retaining fluid in her lower extremities - cephALEXin (KEFLEX) capsule 500 mg  F/u with shannon mcgowan annually or sooner as needed  Hollice Espy, MD

## 2021-05-06 ENCOUNTER — Other Ambulatory Visit: Payer: Self-pay | Admitting: *Deleted

## 2021-05-06 ENCOUNTER — Other Ambulatory Visit
Admission: RE | Admit: 2021-05-06 | Discharge: 2021-05-06 | Disposition: A | Discharge status: Home / Self Care | Payer: PPO | Attending: Urology | Admitting: Urology

## 2021-05-06 ENCOUNTER — Encounter: Payer: Self-pay | Admitting: Urology

## 2021-05-06 ENCOUNTER — Other Ambulatory Visit: Payer: Self-pay

## 2021-05-06 ENCOUNTER — Ambulatory Visit: Payer: PPO | Admitting: Urology

## 2021-05-06 VITALS — BP 142/92 | HR 60

## 2021-05-06 DIAGNOSIS — N390 Urinary tract infection, site not specified: Secondary | ICD-10-CM | POA: Insufficient documentation

## 2021-05-06 LAB — URINALYSIS, COMPLETE (UACMP) WITH MICROSCOPIC
Bilirubin Urine: NEGATIVE
Glucose, UA: NEGATIVE mg/dL
Ketones, ur: NEGATIVE mg/dL
Nitrite: NEGATIVE
Protein, ur: NEGATIVE mg/dL
Specific Gravity, Urine: 1.01 (ref 1.005–1.030)
pH: 6 (ref 5.0–8.0)

## 2021-05-06 MED ORDER — CEPHALEXIN 250 MG PO CAPS
500.0000 mg | ORAL_CAPSULE | Freq: Once | ORAL | Status: AC
  Administered 2021-05-06: 500 mg via ORAL

## 2021-05-08 ENCOUNTER — Emergency Department: Payer: PPO

## 2021-05-08 ENCOUNTER — Emergency Department
Admission: EM | Admit: 2021-05-08 | Discharge: 2021-05-08 | Disposition: A | Discharge status: Home / Self Care | Payer: PPO | Attending: Emergency Medicine | Admitting: Emergency Medicine

## 2021-05-08 ENCOUNTER — Other Ambulatory Visit: Payer: Self-pay

## 2021-05-08 DIAGNOSIS — Y92009 Unspecified place in unspecified non-institutional (private) residence as the place of occurrence of the external cause: Secondary | ICD-10-CM | POA: Diagnosis not present

## 2021-05-08 DIAGNOSIS — F0390 Unspecified dementia without behavioral disturbance: Secondary | ICD-10-CM | POA: Diagnosis not present

## 2021-05-08 DIAGNOSIS — I6782 Cerebral ischemia: Secondary | ICD-10-CM | POA: Diagnosis not present

## 2021-05-08 DIAGNOSIS — S0101XA Laceration without foreign body of scalp, initial encounter: Secondary | ICD-10-CM | POA: Diagnosis not present

## 2021-05-08 DIAGNOSIS — Z23 Encounter for immunization: Secondary | ICD-10-CM | POA: Diagnosis not present

## 2021-05-08 DIAGNOSIS — W19XXXA Unspecified fall, initial encounter: Secondary | ICD-10-CM | POA: Insufficient documentation

## 2021-05-08 DIAGNOSIS — R9431 Abnormal electrocardiogram [ECG] [EKG]: Secondary | ICD-10-CM | POA: Diagnosis not present

## 2021-05-08 DIAGNOSIS — R58 Hemorrhage, not elsewhere classified: Secondary | ICD-10-CM | POA: Diagnosis not present

## 2021-05-08 DIAGNOSIS — S0990XA Unspecified injury of head, initial encounter: Secondary | ICD-10-CM | POA: Diagnosis not present

## 2021-05-08 DIAGNOSIS — I959 Hypotension, unspecified: Secondary | ICD-10-CM | POA: Diagnosis not present

## 2021-05-08 LAB — BASIC METABOLIC PANEL
Anion gap: 9 (ref 5–15)
BUN: 20 mg/dL (ref 8–23)
CO2: 22 mmol/L (ref 22–32)
Calcium: 8.7 mg/dL — ABNORMAL LOW (ref 8.9–10.3)
Chloride: 106 mmol/L (ref 98–111)
Creatinine, Ser: 0.84 mg/dL (ref 0.44–1.00)
GFR, Estimated: >60 mL/min (ref >60)
Glucose, Bld: 154 mg/dL — ABNORMAL HIGH (ref 70–99)
Potassium: 3.5 mmol/L (ref 3.5–5.1)
Sodium: 137 mmol/L (ref 135–145)

## 2021-05-08 LAB — CBC
HCT: 33.6 % — ABNORMAL LOW (ref 36.0–46.0)
Hemoglobin: 10.2 g/dL — ABNORMAL LOW (ref 12.0–15.0)
MCH: 27 pg (ref 26.0–34.0)
MCHC: 30.4 g/dL (ref 30.0–36.0)
MCV: 88.9 fL (ref 80.0–100.0)
Platelets: 452 10*3/uL — ABNORMAL HIGH (ref 150–400)
RBC: 3.78 MIL/uL — ABNORMAL LOW (ref 3.87–5.11)
RDW: 14.4 % (ref 11.5–15.5)
WBC: 8.8 10*3/uL (ref 4.0–10.5)
nRBC: 0 % (ref 0.0–0.2)

## 2021-05-08 LAB — TROPONIN I (HIGH SENSITIVITY): Troponin I (High Sensitivity): 15 ng/L (ref <18)

## 2021-05-08 MED ORDER — ACETAMINOPHEN 325 MG PO TABS
650.0000 mg | ORAL_TABLET | Freq: Once | ORAL | Status: AC
Start: 2021-05-08 — End: 2021-05-08
  Administered 2021-05-08: 650 mg via ORAL
  Filled 2021-05-08: qty 2

## 2021-05-08 MED ORDER — TETANUS-DIPHTH-ACELL PERTUSSIS 5-2.5-18.5 LF-MCG/0.5 IM SUSY
0.5000 mL | PREFILLED_SYRINGE | Freq: Once | INTRAMUSCULAR | Status: AC
Start: 2021-05-08 — End: 2021-05-08
  Administered 2021-05-08: 0.5 mL via INTRAMUSCULAR
  Filled 2021-05-08: qty 0.5

## 2021-05-08 MED ORDER — LIDOCAINE HCL 1 % IJ SOLN
5.0000 mL | Freq: Once | INTRAMUSCULAR | Status: AC
  Administered 2021-05-08: 5 mL
  Filled 2021-05-08: qty 10

## 2021-05-08 NOTE — ED Provider Notes (Signed)
[facility] Provider Note  Patient Contact: 5:07 PM (approximate)   History   Fall   HPI  Heidi Arias is a 88 y.o. female with a history of frequent falls, dementia and chronic urinary tract infections, presents to the emergency department after patient had a mechanical fall at home.  Patient is unable to recall details surrounding injury and event was unwitnessed.  Patient has a 2 cm left-sided temporal laceration.  Patient currently lives independently but daughter states that she is going to transition patient into living with her as of today.  Tetanus status is up-to-date.  Patient is typically wheelchair-bound and does not ambulate at baseline.      Physical Exam   Triage Vital Signs: ED Triage Vitals  Enc Vitals Group     BP 05/08/21 1656 139/66     Pulse Rate 05/08/21 1653 92     Resp 05/08/21 1653 20     Temp 05/08/21 1653 99.1 F (37.3 C)     Temp Source 05/08/21 1653 Oral     SpO2 05/08/21 1653 99 %     Weight 05/08/21 1654 125 lb (56.7 kg)     Height 05/08/21 1654 5\' 2"  (1.575 m)     Head Circumference --      Peak Flow --      Pain Score 05/08/21 1654 0     Pain Loc --      Pain Edu? --      Excl. in [place]? --     Most recent vital signs: Vitals:   05/08/21 1656 05/08/21 1932  BP: 139/66 129/72  Pulse:  91  Resp:  15  Temp:  98 F (36.7 C)  SpO2:  99%     General: Alert and in no acute distress. Eyes:  PERRL. EOMI. Head: No acute traumatic findings.  Patient has 2 cm laceration at left temple. ENT:      Nose: No congestion/rhinnorhea.      Mouth/Throat: Mucous membranes are moist. Neck: No stridor. No cervical spine tenderness to palpation. Cardiovascular:  Good peripheral perfusion Respiratory: Normal respiratory effort without tachypnea or retractions. Lungs CTAB. Good air entry to the bases with no decreased or absent breath sounds. Gastrointestinal: Bowel sounds 4 quadrants. Soft and nontender to palpation.  No guarding or rigidity. No palpable masses. No distention. No CVA tenderness. Musculoskeletal: Full range of motion to all extremities.  Neurologic:  No gross focal neurologic deficits are appreciated.  Skin: Other:   ED Results / Procedures / Treatments   Labs (all labs ordered are listed, but only abnormal results are displayed) Labs Reviewed  CBC - Abnormal; Notable for the following components:      Result Value   RBC 3.78 (*)    Hemoglobin 10.2 (*)    HCT 33.6 (*)    Platelets 452 (*)    All other components within normal limits  BASIC METABOLIC PANEL - Abnormal; Notable for the following components:   Glucose, Bld 154 (*)    Calcium 8.7 (*)    All other components within normal limits  TROPONIN I (HIGH SENSITIVITY)     EKG  Normal sinus rhythm without ST segment elevation or other apparent arrhythmia.   RADIOLOGY  I personally viewed and evaluated these images as part of my medical decision making, as well as reviewing the written report by the radiologist.  ED Provider Interpretation: I personally reviewed CT head.  No evidence of intracranial bleed or skull fracture.  PROCEDURES:  Critical Care performed: No  ..Laceration Repair  Date/Time: 05/08/2021 8:59 PM Performed by: Lannie Fields, PA-C Authorized by: Lannie Fields, PA-C   Consent:    Consent obtained:  Verbal   Risks discussed:  Infection and pain Universal protocol:    Procedure explained and questions answered to patient or proxy's satisfaction: yes     Patient identity confirmed:  Verbally with patient Anesthesia:    Anesthesia method:  Local infiltration   Local anesthetic:  Lidocaine 1% w/o epi Laceration details:    Location:  Scalp   Scalp location:  L temporal   Length (cm):  2 Pre-procedure details:    Preparation:  Patient was prepped and draped in usual sterile fashion Exploration:    Limited defect created (wound extended): no     Wound exploration: wound explored  through full range of motion     Contaminated: no   Treatment:    Area cleansed with:  Povidone-iodine   Amount of cleaning:  Standard   Visualized foreign bodies/material removed: no     Debridement:  None   Undermining:  None Skin repair:    Repair method:  Staples   Number of staples:  4 Approximation:    Approximation:  Close Repair type:    Repair type:  Simple Post-procedure details:    Dressing:  Non-adherent dressing   MEDICATIONS ORDERED IN ED: Medications  lidocaine (XYLOCAINE) 1 % (with pres) injection 5 mL (5 mLs Infiltration Given by Other 05/08/21 1719)  acetaminophen (TYLENOL) tablet 650 mg (650 mg Oral Given 05/08/21 1931)  Tdap (BOOSTRIX) injection 0.5 mL (0.5 mLs Intramuscular Given 05/08/21 1935)     IMPRESSION / MDM / [place] / ED COURSE  I reviewed the triage vital signs and the nursing notes.                              Differential diagnosis includes, but is not limited to, laceration, intracranial bleed, skull fracture, UTI, electrolyte abnormality  Assessment and Plan:  Fall:  88 year old female presents to the emergency department after she lost her balance while ambulating to the restroom.  Vital signs were reassuring at triage.  On physical exam, patient was alert and nontoxic-appearing.  She had a scalp laceration which was repaired in the emergency department without complication and patient's daughter was advised to remove staples in 7 days.   CT showed no evidence of intracranial bleed or skull fracture.  Tetanus status is up-to-date.  CBC and BMP unremarkable.  Troponin was within reference range.  EKG indicated normal sinus rhythm without ST segment elevation or other apparent arrhythmia.  Patient's daughter feels comfortable taking patient home.  Tylenol was recommended for discomfort at home.  Return precautions were given to return with new or worsening symptoms.     FINAL CLINICAL IMPRESSION(S) / ED DIAGNOSES   Final  diagnoses:  Laceration of scalp, initial encounter     Rx / DC Orders   ED Discharge Orders     None        Note:  This document was prepared using Dragon voice recognition software and may include unintentional dictation errors.   Vallarie Mare Carver, Hershal Coria 05/08/21 2101    Blake Divine, MD 05/08/21 2351

## 2021-05-08 NOTE — ED Notes (Signed)
Patient transported to CT 

## 2021-05-08 NOTE — ED Triage Notes (Signed)
First nurse note: pt comes ems from home with fall. Lac to head. Lost balance and fell. Hx of dementia. Doesn't remember falling. Not on thinners. VSS.

## 2021-05-08 NOTE — ED Notes (Signed)
Patient given ginger ale. 

## 2021-05-08 NOTE — Discharge Instructions (Addendum)
Have staples removed in 1 week. You can continue to take Tylenol for pain.

## 2021-05-08 NOTE — ED Triage Notes (Signed)
See first nurse note. Pt reports does not remember fall, "I dont know what happened" Bleeding controlled at this time.  Pt answering some orientation questions appropriately, [facility]

## 2021-05-10 DIAGNOSIS — S3210XA Unspecified fracture of sacrum, initial encounter for closed fracture: Secondary | ICD-10-CM | POA: Diagnosis not present

## 2021-05-10 DIAGNOSIS — M48062 Spinal stenosis, lumbar region with neurogenic claudication: Secondary | ICD-10-CM | POA: Diagnosis not present

## 2021-05-10 DIAGNOSIS — G8929 Other chronic pain: Secondary | ICD-10-CM | POA: Diagnosis not present

## 2021-05-10 DIAGNOSIS — M5441 Lumbago with sciatica, right side: Secondary | ICD-10-CM | POA: Diagnosis not present

## 2021-05-11 DIAGNOSIS — F419 Anxiety disorder, unspecified: Secondary | ICD-10-CM | POA: Diagnosis not present

## 2021-05-11 DIAGNOSIS — E1122 Type 2 diabetes mellitus with diabetic chronic kidney disease: Secondary | ICD-10-CM | POA: Diagnosis not present

## 2021-05-11 DIAGNOSIS — Z0000 Encounter for general adult medical examination without abnormal findings: Secondary | ICD-10-CM | POA: Diagnosis not present

## 2021-05-11 DIAGNOSIS — N390 Urinary tract infection, site not specified: Secondary | ICD-10-CM | POA: Diagnosis not present

## 2021-05-11 DIAGNOSIS — D509 Iron deficiency anemia, unspecified: Secondary | ICD-10-CM | POA: Diagnosis not present

## 2021-05-11 DIAGNOSIS — M545 Low back pain, unspecified: Secondary | ICD-10-CM | POA: Diagnosis not present

## 2021-05-11 DIAGNOSIS — R54 Age-related physical debility: Secondary | ICD-10-CM | POA: Diagnosis not present

## 2021-05-11 DIAGNOSIS — Z79899 Other long term (current) drug therapy: Secondary | ICD-10-CM | POA: Diagnosis not present

## 2021-05-11 DIAGNOSIS — W19XXXD Unspecified fall, subsequent encounter: Secondary | ICD-10-CM | POA: Diagnosis not present

## 2021-05-11 DIAGNOSIS — Y92009 Unspecified place in unspecified non-institutional (private) residence as the place of occurrence of the external cause: Secondary | ICD-10-CM | POA: Diagnosis not present

## 2021-05-11 DIAGNOSIS — K219 Gastro-esophageal reflux disease without esophagitis: Secondary | ICD-10-CM | POA: Diagnosis not present

## 2021-05-11 DIAGNOSIS — N1831 Chronic kidney disease, stage 3a: Secondary | ICD-10-CM | POA: Diagnosis not present

## 2021-05-12 ENCOUNTER — Ambulatory Visit
Admission: RE | Admit: 2021-05-12 | Discharge: 2021-05-12 | Disposition: A | Discharge status: Home / Self Care | Payer: PPO | Source: Ambulatory Visit | Attending: Physician Assistant | Admitting: Physician Assistant

## 2021-05-12 ENCOUNTER — Other Ambulatory Visit: Payer: Self-pay

## 2021-05-12 DIAGNOSIS — S3210XA Unspecified fracture of sacrum, initial encounter for closed fracture: Secondary | ICD-10-CM | POA: Diagnosis not present

## 2021-05-12 DIAGNOSIS — M160 Bilateral primary osteoarthritis of hip: Secondary | ICD-10-CM | POA: Diagnosis not present

## 2021-05-12 DIAGNOSIS — M47816 Spondylosis without myelopathy or radiculopathy, lumbar region: Secondary | ICD-10-CM | POA: Diagnosis not present

## 2021-05-12 DIAGNOSIS — M8448XA Pathological fracture, other site, initial encounter for fracture: Secondary | ICD-10-CM | POA: Diagnosis not present

## 2021-05-12 DIAGNOSIS — M5126 Other intervertebral disc displacement, lumbar region: Secondary | ICD-10-CM | POA: Diagnosis not present

## 2021-05-13 DIAGNOSIS — K5790 Diverticulosis of intestine, part unspecified, without perforation or abscess without bleeding: Secondary | ICD-10-CM | POA: Diagnosis not present

## 2021-05-13 DIAGNOSIS — F419 Anxiety disorder, unspecified: Secondary | ICD-10-CM | POA: Diagnosis not present

## 2021-05-13 DIAGNOSIS — Z87440 Personal history of urinary (tract) infections: Secondary | ICD-10-CM | POA: Diagnosis not present

## 2021-05-13 DIAGNOSIS — N183 Chronic kidney disease, stage 3 unspecified: Secondary | ICD-10-CM | POA: Diagnosis not present

## 2021-05-13 DIAGNOSIS — E559 Vitamin D deficiency, unspecified: Secondary | ICD-10-CM | POA: Diagnosis not present

## 2021-05-13 DIAGNOSIS — R296 Repeated falls: Secondary | ICD-10-CM | POA: Diagnosis not present

## 2021-05-13 DIAGNOSIS — Z87891 Personal history of nicotine dependence: Secondary | ICD-10-CM | POA: Diagnosis not present

## 2021-05-13 DIAGNOSIS — Z9181 History of falling: Secondary | ICD-10-CM | POA: Diagnosis not present

## 2021-05-13 DIAGNOSIS — N3941 Urge incontinence: Secondary | ICD-10-CM | POA: Diagnosis not present

## 2021-05-13 DIAGNOSIS — G8929 Other chronic pain: Secondary | ICD-10-CM | POA: Diagnosis not present

## 2021-05-13 DIAGNOSIS — C439 Malignant melanoma of skin, unspecified: Secondary | ICD-10-CM | POA: Diagnosis not present

## 2021-05-13 DIAGNOSIS — M479 Spondylosis, unspecified: Secondary | ICD-10-CM | POA: Diagnosis not present

## 2021-05-13 DIAGNOSIS — M545 Low back pain, unspecified: Secondary | ICD-10-CM | POA: Diagnosis not present

## 2021-05-13 DIAGNOSIS — D509 Iron deficiency anemia, unspecified: Secondary | ICD-10-CM | POA: Diagnosis not present

## 2021-05-13 DIAGNOSIS — H9193 Unspecified hearing loss, bilateral: Secondary | ICD-10-CM | POA: Diagnosis not present

## 2021-05-13 DIAGNOSIS — K219 Gastro-esophageal reflux disease without esophagitis: Secondary | ICD-10-CM | POA: Diagnosis not present

## 2021-05-13 DIAGNOSIS — M1990 Unspecified osteoarthritis, unspecified site: Secondary | ICD-10-CM | POA: Diagnosis not present

## 2021-05-13 DIAGNOSIS — E1122 Type 2 diabetes mellitus with diabetic chronic kidney disease: Secondary | ICD-10-CM | POA: Diagnosis not present

## 2021-05-16 DIAGNOSIS — M545 Low back pain, unspecified: Secondary | ICD-10-CM | POA: Diagnosis not present

## 2021-05-16 DIAGNOSIS — N3941 Urge incontinence: Secondary | ICD-10-CM | POA: Diagnosis not present

## 2021-05-16 DIAGNOSIS — M1990 Unspecified osteoarthritis, unspecified site: Secondary | ICD-10-CM | POA: Diagnosis not present

## 2021-05-16 DIAGNOSIS — Z87440 Personal history of urinary (tract) infections: Secondary | ICD-10-CM | POA: Diagnosis not present

## 2021-05-16 DIAGNOSIS — N183 Chronic kidney disease, stage 3 unspecified: Secondary | ICD-10-CM | POA: Diagnosis not present

## 2021-05-16 DIAGNOSIS — G8929 Other chronic pain: Secondary | ICD-10-CM | POA: Diagnosis not present

## 2021-05-16 DIAGNOSIS — K219 Gastro-esophageal reflux disease without esophagitis: Secondary | ICD-10-CM | POA: Diagnosis not present

## 2021-05-16 DIAGNOSIS — R296 Repeated falls: Secondary | ICD-10-CM | POA: Diagnosis not present

## 2021-05-16 DIAGNOSIS — D509 Iron deficiency anemia, unspecified: Secondary | ICD-10-CM | POA: Diagnosis not present

## 2021-05-16 DIAGNOSIS — E1122 Type 2 diabetes mellitus with diabetic chronic kidney disease: Secondary | ICD-10-CM | POA: Diagnosis not present

## 2021-05-16 DIAGNOSIS — Z9181 History of falling: Secondary | ICD-10-CM | POA: Diagnosis not present

## 2021-05-16 DIAGNOSIS — E559 Vitamin D deficiency, unspecified: Secondary | ICD-10-CM | POA: Diagnosis not present

## 2021-05-16 DIAGNOSIS — C439 Malignant melanoma of skin, unspecified: Secondary | ICD-10-CM | POA: Diagnosis not present

## 2021-05-16 DIAGNOSIS — M479 Spondylosis, unspecified: Secondary | ICD-10-CM | POA: Diagnosis not present

## 2021-05-16 DIAGNOSIS — F419 Anxiety disorder, unspecified: Secondary | ICD-10-CM | POA: Diagnosis not present

## 2021-05-16 DIAGNOSIS — Z87891 Personal history of nicotine dependence: Secondary | ICD-10-CM | POA: Diagnosis not present

## 2021-05-16 DIAGNOSIS — K5790 Diverticulosis of intestine, part unspecified, without perforation or abscess without bleeding: Secondary | ICD-10-CM | POA: Diagnosis not present

## 2021-05-16 DIAGNOSIS — H9193 Unspecified hearing loss, bilateral: Secondary | ICD-10-CM | POA: Diagnosis not present

## 2021-05-19 DIAGNOSIS — D509 Iron deficiency anemia, unspecified: Secondary | ICD-10-CM | POA: Diagnosis not present

## 2021-05-19 DIAGNOSIS — M545 Low back pain, unspecified: Secondary | ICD-10-CM | POA: Diagnosis not present

## 2021-05-19 DIAGNOSIS — F419 Anxiety disorder, unspecified: Secondary | ICD-10-CM | POA: Diagnosis not present

## 2021-05-19 DIAGNOSIS — E1122 Type 2 diabetes mellitus with diabetic chronic kidney disease: Secondary | ICD-10-CM | POA: Diagnosis not present

## 2021-05-19 DIAGNOSIS — Z87891 Personal history of nicotine dependence: Secondary | ICD-10-CM | POA: Diagnosis not present

## 2021-05-19 DIAGNOSIS — R296 Repeated falls: Secondary | ICD-10-CM | POA: Diagnosis not present

## 2021-05-19 DIAGNOSIS — M479 Spondylosis, unspecified: Secondary | ICD-10-CM | POA: Diagnosis not present

## 2021-05-19 DIAGNOSIS — E559 Vitamin D deficiency, unspecified: Secondary | ICD-10-CM | POA: Diagnosis not present

## 2021-05-19 DIAGNOSIS — N3941 Urge incontinence: Secondary | ICD-10-CM | POA: Diagnosis not present

## 2021-05-19 DIAGNOSIS — Z87440 Personal history of urinary (tract) infections: Secondary | ICD-10-CM | POA: Diagnosis not present

## 2021-05-19 DIAGNOSIS — K5790 Diverticulosis of intestine, part unspecified, without perforation or abscess without bleeding: Secondary | ICD-10-CM | POA: Diagnosis not present

## 2021-05-19 DIAGNOSIS — K219 Gastro-esophageal reflux disease without esophagitis: Secondary | ICD-10-CM | POA: Diagnosis not present

## 2021-05-19 DIAGNOSIS — Z9181 History of falling: Secondary | ICD-10-CM | POA: Diagnosis not present

## 2021-05-19 DIAGNOSIS — N183 Chronic kidney disease, stage 3 unspecified: Secondary | ICD-10-CM | POA: Diagnosis not present

## 2021-05-19 DIAGNOSIS — C439 Malignant melanoma of skin, unspecified: Secondary | ICD-10-CM | POA: Diagnosis not present

## 2021-05-19 DIAGNOSIS — H9193 Unspecified hearing loss, bilateral: Secondary | ICD-10-CM | POA: Diagnosis not present

## 2021-05-19 DIAGNOSIS — G8929 Other chronic pain: Secondary | ICD-10-CM | POA: Diagnosis not present

## 2021-05-19 DIAGNOSIS — M1990 Unspecified osteoarthritis, unspecified site: Secondary | ICD-10-CM | POA: Diagnosis not present

## 2021-05-20 DIAGNOSIS — M48062 Spinal stenosis, lumbar region with neurogenic claudication: Secondary | ICD-10-CM | POA: Diagnosis not present

## 2021-05-20 DIAGNOSIS — E119 Type 2 diabetes mellitus without complications: Secondary | ICD-10-CM | POA: Diagnosis not present

## 2021-05-26 DIAGNOSIS — F419 Anxiety disorder, unspecified: Secondary | ICD-10-CM | POA: Diagnosis not present

## 2021-05-26 DIAGNOSIS — R296 Repeated falls: Secondary | ICD-10-CM | POA: Diagnosis not present

## 2021-05-26 DIAGNOSIS — M1990 Unspecified osteoarthritis, unspecified site: Secondary | ICD-10-CM | POA: Diagnosis not present

## 2021-05-26 DIAGNOSIS — Z87440 Personal history of urinary (tract) infections: Secondary | ICD-10-CM | POA: Diagnosis not present

## 2021-05-26 DIAGNOSIS — M545 Low back pain, unspecified: Secondary | ICD-10-CM | POA: Diagnosis not present

## 2021-05-26 DIAGNOSIS — Z87891 Personal history of nicotine dependence: Secondary | ICD-10-CM | POA: Diagnosis not present

## 2021-05-26 DIAGNOSIS — D509 Iron deficiency anemia, unspecified: Secondary | ICD-10-CM | POA: Diagnosis not present

## 2021-05-26 DIAGNOSIS — H9193 Unspecified hearing loss, bilateral: Secondary | ICD-10-CM | POA: Diagnosis not present

## 2021-05-26 DIAGNOSIS — C439 Malignant melanoma of skin, unspecified: Secondary | ICD-10-CM | POA: Diagnosis not present

## 2021-05-26 DIAGNOSIS — K5790 Diverticulosis of intestine, part unspecified, without perforation or abscess without bleeding: Secondary | ICD-10-CM | POA: Diagnosis not present

## 2021-05-26 DIAGNOSIS — G8929 Other chronic pain: Secondary | ICD-10-CM | POA: Diagnosis not present

## 2021-05-26 DIAGNOSIS — N3941 Urge incontinence: Secondary | ICD-10-CM | POA: Diagnosis not present

## 2021-05-26 DIAGNOSIS — E1122 Type 2 diabetes mellitus with diabetic chronic kidney disease: Secondary | ICD-10-CM | POA: Diagnosis not present

## 2021-05-26 DIAGNOSIS — K219 Gastro-esophageal reflux disease without esophagitis: Secondary | ICD-10-CM | POA: Diagnosis not present

## 2021-05-26 DIAGNOSIS — E559 Vitamin D deficiency, unspecified: Secondary | ICD-10-CM | POA: Diagnosis not present

## 2021-05-26 DIAGNOSIS — N183 Chronic kidney disease, stage 3 unspecified: Secondary | ICD-10-CM | POA: Diagnosis not present

## 2021-05-26 DIAGNOSIS — M479 Spondylosis, unspecified: Secondary | ICD-10-CM | POA: Diagnosis not present

## 2021-05-26 DIAGNOSIS — Z9181 History of falling: Secondary | ICD-10-CM | POA: Diagnosis not present

## 2021-05-27 DIAGNOSIS — Z85828 Personal history of other malignant neoplasm of skin: Secondary | ICD-10-CM | POA: Diagnosis not present

## 2021-05-27 DIAGNOSIS — L853 Xerosis cutis: Secondary | ICD-10-CM | POA: Diagnosis not present

## 2021-05-27 DIAGNOSIS — Z859 Personal history of malignant neoplasm, unspecified: Secondary | ICD-10-CM | POA: Diagnosis not present

## 2021-05-27 DIAGNOSIS — L821 Other seborrheic keratosis: Secondary | ICD-10-CM | POA: Diagnosis not present

## 2021-05-27 DIAGNOSIS — L570 Actinic keratosis: Secondary | ICD-10-CM | POA: Diagnosis not present

## 2021-05-27 DIAGNOSIS — L578 Other skin changes due to chronic exposure to nonionizing radiation: Secondary | ICD-10-CM | POA: Diagnosis not present

## 2021-05-31 DIAGNOSIS — B351 Tinea unguium: Secondary | ICD-10-CM | POA: Diagnosis not present

## 2021-05-31 DIAGNOSIS — E119 Type 2 diabetes mellitus without complications: Secondary | ICD-10-CM | POA: Diagnosis not present

## 2021-06-07 DIAGNOSIS — S3210XA Unspecified fracture of sacrum, initial encounter for closed fracture: Secondary | ICD-10-CM | POA: Diagnosis not present

## 2021-06-07 DIAGNOSIS — G8929 Other chronic pain: Secondary | ICD-10-CM | POA: Diagnosis not present

## 2021-06-07 DIAGNOSIS — M48062 Spinal stenosis, lumbar region with neurogenic claudication: Secondary | ICD-10-CM | POA: Diagnosis not present

## 2021-06-07 DIAGNOSIS — M5441 Lumbago with sciatica, right side: Secondary | ICD-10-CM | POA: Diagnosis not present

## 2021-06-08 DIAGNOSIS — Z87891 Personal history of nicotine dependence: Secondary | ICD-10-CM | POA: Diagnosis not present

## 2021-06-08 DIAGNOSIS — N183 Chronic kidney disease, stage 3 unspecified: Secondary | ICD-10-CM | POA: Diagnosis not present

## 2021-06-08 DIAGNOSIS — Z9181 History of falling: Secondary | ICD-10-CM | POA: Diagnosis not present

## 2021-06-08 DIAGNOSIS — M479 Spondylosis, unspecified: Secondary | ICD-10-CM | POA: Diagnosis not present

## 2021-06-08 DIAGNOSIS — M545 Low back pain, unspecified: Secondary | ICD-10-CM | POA: Diagnosis not present

## 2021-06-08 DIAGNOSIS — Z87440 Personal history of urinary (tract) infections: Secondary | ICD-10-CM | POA: Diagnosis not present

## 2021-06-08 DIAGNOSIS — K219 Gastro-esophageal reflux disease without esophagitis: Secondary | ICD-10-CM | POA: Diagnosis not present

## 2021-06-08 DIAGNOSIS — N3941 Urge incontinence: Secondary | ICD-10-CM | POA: Diagnosis not present

## 2021-06-08 DIAGNOSIS — H9193 Unspecified hearing loss, bilateral: Secondary | ICD-10-CM | POA: Diagnosis not present

## 2021-06-08 DIAGNOSIS — K5790 Diverticulosis of intestine, part unspecified, without perforation or abscess without bleeding: Secondary | ICD-10-CM | POA: Diagnosis not present

## 2021-06-08 DIAGNOSIS — R296 Repeated falls: Secondary | ICD-10-CM | POA: Diagnosis not present

## 2021-06-08 DIAGNOSIS — F419 Anxiety disorder, unspecified: Secondary | ICD-10-CM | POA: Diagnosis not present

## 2021-06-08 DIAGNOSIS — M1990 Unspecified osteoarthritis, unspecified site: Secondary | ICD-10-CM | POA: Diagnosis not present

## 2021-06-08 DIAGNOSIS — D509 Iron deficiency anemia, unspecified: Secondary | ICD-10-CM | POA: Diagnosis not present

## 2021-06-08 DIAGNOSIS — E1122 Type 2 diabetes mellitus with diabetic chronic kidney disease: Secondary | ICD-10-CM | POA: Diagnosis not present

## 2021-06-08 DIAGNOSIS — G8929 Other chronic pain: Secondary | ICD-10-CM | POA: Diagnosis not present

## 2021-06-08 DIAGNOSIS — E559 Vitamin D deficiency, unspecified: Secondary | ICD-10-CM | POA: Diagnosis not present

## 2021-06-08 DIAGNOSIS — C439 Malignant melanoma of skin, unspecified: Secondary | ICD-10-CM | POA: Diagnosis not present

## 2021-06-10 DIAGNOSIS — G8929 Other chronic pain: Secondary | ICD-10-CM | POA: Diagnosis not present

## 2021-06-10 DIAGNOSIS — M1990 Unspecified osteoarthritis, unspecified site: Secondary | ICD-10-CM | POA: Diagnosis not present

## 2021-06-10 DIAGNOSIS — Z79890 Hormone replacement therapy (postmenopausal): Secondary | ICD-10-CM | POA: Diagnosis not present

## 2021-06-10 DIAGNOSIS — Z87440 Personal history of urinary (tract) infections: Secondary | ICD-10-CM | POA: Diagnosis not present

## 2021-06-10 DIAGNOSIS — F419 Anxiety disorder, unspecified: Secondary | ICD-10-CM | POA: Diagnosis not present

## 2021-06-10 DIAGNOSIS — M479 Spondylosis, unspecified: Secondary | ICD-10-CM | POA: Diagnosis not present

## 2021-06-10 DIAGNOSIS — D509 Iron deficiency anemia, unspecified: Secondary | ICD-10-CM | POA: Diagnosis not present

## 2021-06-10 DIAGNOSIS — C439 Malignant melanoma of skin, unspecified: Secondary | ICD-10-CM | POA: Diagnosis not present

## 2021-06-10 DIAGNOSIS — R296 Repeated falls: Secondary | ICD-10-CM | POA: Diagnosis not present

## 2021-06-10 DIAGNOSIS — N183 Chronic kidney disease, stage 3 unspecified: Secondary | ICD-10-CM | POA: Diagnosis not present

## 2021-06-10 DIAGNOSIS — Z9181 History of falling: Secondary | ICD-10-CM | POA: Diagnosis not present

## 2021-06-10 DIAGNOSIS — N3941 Urge incontinence: Secondary | ICD-10-CM | POA: Diagnosis not present

## 2021-06-10 DIAGNOSIS — Z961 Presence of intraocular lens: Secondary | ICD-10-CM | POA: Diagnosis not present

## 2021-06-10 DIAGNOSIS — H9193 Unspecified hearing loss, bilateral: Secondary | ICD-10-CM | POA: Diagnosis not present

## 2021-06-10 DIAGNOSIS — E559 Vitamin D deficiency, unspecified: Secondary | ICD-10-CM | POA: Diagnosis not present

## 2021-06-10 DIAGNOSIS — K219 Gastro-esophageal reflux disease without esophagitis: Secondary | ICD-10-CM | POA: Diagnosis not present

## 2021-06-10 DIAGNOSIS — E1122 Type 2 diabetes mellitus with diabetic chronic kidney disease: Secondary | ICD-10-CM | POA: Diagnosis not present

## 2021-06-10 DIAGNOSIS — Z8616 Personal history of COVID-19: Secondary | ICD-10-CM | POA: Diagnosis not present

## 2021-06-10 DIAGNOSIS — M545 Low back pain, unspecified: Secondary | ICD-10-CM | POA: Diagnosis not present

## 2021-06-10 DIAGNOSIS — K5790 Diverticulosis of intestine, part unspecified, without perforation or abscess without bleeding: Secondary | ICD-10-CM | POA: Diagnosis not present

## 2021-06-10 DIAGNOSIS — Z87891 Personal history of nicotine dependence: Secondary | ICD-10-CM | POA: Diagnosis not present

## 2021-06-14 DIAGNOSIS — M545 Low back pain, unspecified: Secondary | ICD-10-CM | POA: Diagnosis not present

## 2021-06-14 DIAGNOSIS — Z8616 Personal history of COVID-19: Secondary | ICD-10-CM | POA: Diagnosis not present

## 2021-06-14 DIAGNOSIS — C439 Malignant melanoma of skin, unspecified: Secondary | ICD-10-CM | POA: Diagnosis not present

## 2021-06-14 DIAGNOSIS — K219 Gastro-esophageal reflux disease without esophagitis: Secondary | ICD-10-CM | POA: Diagnosis not present

## 2021-06-14 DIAGNOSIS — M1990 Unspecified osteoarthritis, unspecified site: Secondary | ICD-10-CM | POA: Diagnosis not present

## 2021-06-14 DIAGNOSIS — R296 Repeated falls: Secondary | ICD-10-CM | POA: Diagnosis not present

## 2021-06-14 DIAGNOSIS — Z79890 Hormone replacement therapy (postmenopausal): Secondary | ICD-10-CM | POA: Diagnosis not present

## 2021-06-14 DIAGNOSIS — F419 Anxiety disorder, unspecified: Secondary | ICD-10-CM | POA: Diagnosis not present

## 2021-06-14 DIAGNOSIS — N183 Chronic kidney disease, stage 3 unspecified: Secondary | ICD-10-CM | POA: Diagnosis not present

## 2021-06-14 DIAGNOSIS — E1122 Type 2 diabetes mellitus with diabetic chronic kidney disease: Secondary | ICD-10-CM | POA: Diagnosis not present

## 2021-06-14 DIAGNOSIS — D509 Iron deficiency anemia, unspecified: Secondary | ICD-10-CM | POA: Diagnosis not present

## 2021-06-14 DIAGNOSIS — Z87440 Personal history of urinary (tract) infections: Secondary | ICD-10-CM | POA: Diagnosis not present

## 2021-06-14 DIAGNOSIS — Z87891 Personal history of nicotine dependence: Secondary | ICD-10-CM | POA: Diagnosis not present

## 2021-06-14 DIAGNOSIS — E559 Vitamin D deficiency, unspecified: Secondary | ICD-10-CM | POA: Diagnosis not present

## 2021-06-14 DIAGNOSIS — K5790 Diverticulosis of intestine, part unspecified, without perforation or abscess without bleeding: Secondary | ICD-10-CM | POA: Diagnosis not present

## 2021-06-14 DIAGNOSIS — N3941 Urge incontinence: Secondary | ICD-10-CM | POA: Diagnosis not present

## 2021-06-14 DIAGNOSIS — G8929 Other chronic pain: Secondary | ICD-10-CM | POA: Diagnosis not present

## 2021-06-14 DIAGNOSIS — Z9181 History of falling: Secondary | ICD-10-CM | POA: Diagnosis not present

## 2021-06-14 DIAGNOSIS — H9193 Unspecified hearing loss, bilateral: Secondary | ICD-10-CM | POA: Diagnosis not present

## 2021-06-14 DIAGNOSIS — M479 Spondylosis, unspecified: Secondary | ICD-10-CM | POA: Diagnosis not present

## 2021-08-01 ENCOUNTER — Other Ambulatory Visit: Payer: Self-pay | Admitting: *Deleted

## 2021-08-01 MED ORDER — MIRABEGRON ER 50 MG PO TB24: ORAL_TABLET | ORAL | 3 refills | Status: DC

## 2021-08-03 ENCOUNTER — Telehealth: Payer: Self-pay

## 2021-08-03 NOTE — Telephone Encounter (Signed)
I think it is reasonable to try this, certainly there is no harm.  Technically, the drug is supposed to be at a steady state in the bloodstream after certain number of doses and so switching the time may not have an effect but it is worth trying.  ? ?Hollice Espy, MD ? ?

## 2021-08-03 NOTE — Telephone Encounter (Signed)
Incoming call on triage line from pts daughter in regards to pt's Myrbetriq '50mg'$  Rx. Daughter states pt is having incontinence issues in the morning when she first gets up. Daughter says she gives pt Myrbetriq in the morning and wants to know if she switched administration to the evening, would this possibly help with the morning incontinence? If it wont help, is there an alternative?  ?

## 2021-08-03 NOTE — Telephone Encounter (Signed)
Patient's daughter informed, aware can take in the evening. Voiced understanding.  ?

## 2021-08-11 DIAGNOSIS — H7013 Chronic mastoiditis, bilateral: Secondary | ICD-10-CM | POA: Diagnosis not present

## 2021-08-11 DIAGNOSIS — H903 Sensorineural hearing loss, bilateral: Secondary | ICD-10-CM | POA: Diagnosis not present

## 2021-08-17 ENCOUNTER — Other Ambulatory Visit: Payer: Self-pay | Admitting: Urology

## 2021-09-05 DIAGNOSIS — B351 Tinea unguium: Secondary | ICD-10-CM | POA: Diagnosis not present

## 2021-09-05 DIAGNOSIS — E119 Type 2 diabetes mellitus without complications: Secondary | ICD-10-CM | POA: Diagnosis not present

## 2021-09-05 DIAGNOSIS — M79674 Pain in right toe(s): Secondary | ICD-10-CM | POA: Diagnosis not present

## 2021-09-05 DIAGNOSIS — M79675 Pain in left toe(s): Secondary | ICD-10-CM | POA: Diagnosis not present

## 2021-10-20 ENCOUNTER — Other Ambulatory Visit: Payer: Self-pay | Admitting: Urology

## 2021-10-20 ENCOUNTER — Telehealth: Payer: Self-pay | Admitting: Family Medicine

## 2021-10-20 MED ORDER — CEPHALEXIN 250 MG PO CAPS: ORAL_CAPSULE | ORAL | 0 refills | Status: DC

## 2021-10-20 NOTE — Telephone Encounter (Signed)
Patient's daughter called requesting Keflex and states it's her maintenance ABX. I do see she is on '250mg'$  but can't find it in a note. Is it ok for her to get this medication with refills?

## 2021-10-20 NOTE — Telephone Encounter (Signed)
Refill sent to [facility]. Pt notified.

## 2021-10-20 NOTE — Telephone Encounter (Signed)
Daughter states Rx needs to go to [facility]. Resent Rx.

## 2021-11-07 DIAGNOSIS — G8929 Other chronic pain: Secondary | ICD-10-CM | POA: Diagnosis not present

## 2021-11-07 DIAGNOSIS — N1831 Chronic kidney disease, stage 3a: Secondary | ICD-10-CM | POA: Diagnosis not present

## 2021-11-07 DIAGNOSIS — K219 Gastro-esophageal reflux disease without esophagitis: Secondary | ICD-10-CM | POA: Diagnosis not present

## 2021-11-07 DIAGNOSIS — R54 Age-related physical debility: Secondary | ICD-10-CM | POA: Diagnosis not present

## 2021-11-07 DIAGNOSIS — Z79899 Other long term (current) drug therapy: Secondary | ICD-10-CM | POA: Diagnosis not present

## 2021-11-07 DIAGNOSIS — E1122 Type 2 diabetes mellitus with diabetic chronic kidney disease: Secondary | ICD-10-CM | POA: Diagnosis not present

## 2021-11-07 DIAGNOSIS — F419 Anxiety disorder, unspecified: Secondary | ICD-10-CM | POA: Diagnosis not present

## 2021-11-07 DIAGNOSIS — D509 Iron deficiency anemia, unspecified: Secondary | ICD-10-CM | POA: Diagnosis not present

## 2021-11-07 DIAGNOSIS — M545 Low back pain, unspecified: Secondary | ICD-10-CM | POA: Diagnosis not present

## 2022-01-02 DIAGNOSIS — H7012 Chronic mastoiditis, left ear: Secondary | ICD-10-CM | POA: Diagnosis not present

## 2022-01-02 DIAGNOSIS — H7202 Central perforation of tympanic membrane, left ear: Secondary | ICD-10-CM | POA: Diagnosis not present

## 2022-01-06 DIAGNOSIS — M79675 Pain in left toe(s): Secondary | ICD-10-CM | POA: Diagnosis not present

## 2022-01-06 DIAGNOSIS — B351 Tinea unguium: Secondary | ICD-10-CM | POA: Diagnosis not present

## 2022-01-06 DIAGNOSIS — M79674 Pain in right toe(s): Secondary | ICD-10-CM | POA: Diagnosis not present

## 2022-01-20 ENCOUNTER — Other Ambulatory Visit: Payer: Self-pay | Admitting: Urology

## 2022-01-20 ENCOUNTER — Other Ambulatory Visit: Payer: Self-pay | Admitting: *Deleted

## 2022-02-17 DIAGNOSIS — R051 Acute cough: Secondary | ICD-10-CM | POA: Diagnosis not present

## 2022-02-17 DIAGNOSIS — R8290 Unspecified abnormal findings in urine: Secondary | ICD-10-CM | POA: Diagnosis not present

## 2022-02-17 DIAGNOSIS — N1831 Chronic kidney disease, stage 3a: Secondary | ICD-10-CM | POA: Diagnosis not present

## 2022-02-17 DIAGNOSIS — J22 Unspecified acute lower respiratory infection: Secondary | ICD-10-CM | POA: Diagnosis not present

## 2022-02-17 DIAGNOSIS — Z03818 Encounter for observation for suspected exposure to other biological agents ruled out: Secondary | ICD-10-CM | POA: Diagnosis not present

## 2022-02-17 DIAGNOSIS — N898 Other specified noninflammatory disorders of vagina: Secondary | ICD-10-CM | POA: Diagnosis not present

## 2022-02-17 DIAGNOSIS — R062 Wheezing: Secondary | ICD-10-CM | POA: Diagnosis not present

## 2022-02-17 DIAGNOSIS — Z87440 Personal history of urinary (tract) infections: Secondary | ICD-10-CM | POA: Diagnosis not present

## 2022-02-17 DIAGNOSIS — E119 Type 2 diabetes mellitus without complications: Secondary | ICD-10-CM | POA: Diagnosis not present

## 2022-02-17 DIAGNOSIS — R059 Cough, unspecified: Secondary | ICD-10-CM | POA: Diagnosis not present

## 2022-04-14 DIAGNOSIS — M79674 Pain in right toe(s): Secondary | ICD-10-CM | POA: Diagnosis not present

## 2022-04-14 DIAGNOSIS — B351 Tinea unguium: Secondary | ICD-10-CM | POA: Diagnosis not present

## 2022-04-14 DIAGNOSIS — M79675 Pain in left toe(s): Secondary | ICD-10-CM | POA: Diagnosis not present

## 2022-04-26 ENCOUNTER — Other Ambulatory Visit: Payer: Self-pay | Admitting: Urology

## 2022-04-28 ENCOUNTER — Other Ambulatory Visit: Payer: Self-pay | Admitting: Urology

## 2022-05-03 DIAGNOSIS — J019 Acute sinusitis, unspecified: Secondary | ICD-10-CM | POA: Diagnosis not present

## 2022-05-03 DIAGNOSIS — Z03818 Encounter for observation for suspected exposure to other biological agents ruled out: Secondary | ICD-10-CM | POA: Diagnosis not present

## 2022-05-03 DIAGNOSIS — J209 Acute bronchitis, unspecified: Secondary | ICD-10-CM | POA: Diagnosis not present

## 2022-05-03 DIAGNOSIS — U071 COVID-19: Secondary | ICD-10-CM | POA: Diagnosis not present

## 2022-05-03 DIAGNOSIS — B9689 Other specified bacterial agents as the cause of diseases classified elsewhere: Secondary | ICD-10-CM | POA: Diagnosis not present

## 2022-05-03 DIAGNOSIS — R1312 Dysphagia, oropharyngeal phase: Secondary | ICD-10-CM | POA: Diagnosis not present

## 2022-05-08 ENCOUNTER — Ambulatory Visit: Payer: PPO | Admitting: Urology

## 2022-05-11 NOTE — Progress Notes (Signed)
05/12/2022 12:03 PM   Heidi Arias 01-12-1932 [id number]  Referring provider: Sallee Lange, NP [address] [place],  [place] [address]  Urological history: 1. rUTI's -contributing factors of age, vaginal atrophy, diarrhea, incontinence and poor liquid intake  -RUS 2021 NED -RUS 2022 NED -documented urine cultures over the last year  February 17, 2022 greater than 2 organisms recovered -Keflex 250 mg daily        2. High risk hematuria -non-smoker -CTU (2018) -no worrisome findings -cysto (2018) Mild trabeculation with a large wide mouth diverticulum involving the majority of the right lateral wall -urine cytology (2018) negative -no reports of gross heme   3. Mixed incontinence -contributing factors of age, vaginal atrophy, pelvic surgery and depression -Myrbetriq 50 mg daily    4. Vaginal atrophy -vaginal estrogen cream three nights weekly  HPI: Heidi Arias is a 89 y.o. female who presents today for yearly follow-up with her daughter, Heidi Arias.  She is having 1-7 daytime voids and wears pads during the night, so she sleeps through her leakage.  She has a mild urge to urinate.  She does have urinary incontinence with stress and urge.  She leaks 1-2 times daily.  She goes through 4-6 absorbent pads daily and she does engage in toilet mapping.  Patient denies any modifying or aggravating factors.  Patient denies any gross hematuria, dysuria or suprapubic/flank pain.  Patient denies any fevers, chills, nausea or vomiting.    PVR 30 mL   She is taking the Myrbetriq, Keflex and vaginal estrogen cream.   PMH: Past Medical History:  Diagnosis Date   Anxiety    Cancer ([place])    melanoma   Cancer ([place])    squamous cell   Endometriosis    GERD (gastroesophageal reflux disease)    History of stomach ulcers    Kidney failure    Recurrent UTI (urinary tract infection)    Rheumatoid arthritis ([place])     Surgical History: Past Surgical  History:  Procedure Laterality Date   ABDOMINAL HYSTERECTOMY     APPENDECTOMY     CATARACT EXTRACTION     CHOLECYSTECTOMY     COLON SURGERY     ESOPHAGOGASTRODUODENOSCOPY (EGD) WITH PROPOFOL N/A 03/15/2016   Procedure: ESOPHAGOGASTRODUODENOSCOPY (EGD) WITH PROPOFOL;  Surgeon: Manya Silvas, MD;  Location: [facility];  Service: Endoscopy;  Laterality: N/A;   INNER EAR SURGERY     wears hearing aide   TONSILLECTOMY      Home Medications:  Allergies as of 05/12/2022       Reactions   Ciprofloxacin Hcl Rash   Codeine Sulfate    Other reaction(s): Unknown   Doxycycline Hyclate Rash   Penicillin G Rash   Sulfamethoxazole-trimethoprim Rash        Medication List        Accurate as of May 12, 2022 12:03 PM. If you have any questions, ask your nurse or doctor.          AZO URINARY PAIN PO Take by mouth as needed.   cephALEXin 250 MG capsule Commonly known as: KEFLEX TAKE 1 CAPSULE BY MOUTH EVERY DAY   hydrOXYzine 25 MG tablet Commonly known as: ATARAX Take 25 mg by mouth daily.   lidocaine 5 % Commonly known as: LIDODERM 1 patch daily.   mirabegron ER 50 MG Tb24 tablet Commonly known as: Myrbetriq TAKE (1) TABLET BY MOUTH EVERY DAY   mupirocin ointment 2 % Commonly known as: [employer]  nitrofurantoin (macrocrystal-monohydrate) 100 MG capsule Commonly known as: MACROBID Take 1 capsule (100 mg total) by mouth every 12 (twelve) hours.   omeprazole 40 MG capsule Commonly known as: PRILOSEC Take 1 capsule by mouth 2 (two) times daily. Take 30 minutes before breakfast and take 30 minutes before dinner.   Premarin vaginal cream Generic drug: conjugated estrogens Place 1 Applicatorful vaginally daily. Apply 0.53m (pea-sized amount)  just inside the vaginal introitus with a finger-tip on Monday, Wednesday and Friday nights.   sertraline 50 MG tablet Commonly known as: ZOLOFT   VITAMIN C PO Take by mouth.   VITAMIN D3 PO Take by mouth.         Allergies:  Allergies  Allergen Reactions   Ciprofloxacin Hcl Rash   Codeine Sulfate     Other reaction(s): Unknown   Doxycycline Hyclate Rash   Penicillin G Rash   Sulfamethoxazole-Trimethoprim Rash    Family History: Family History  Problem Relation Age of Onset   CVA Father    Breast cancer Cousin        mat cousin   Prostate cancer Neg Hx    Kidney cancer Neg Hx    Kidney disease Neg Hx    Bladder Cancer Neg Hx     Social History:  reports that she has never smoked. She has never used smokeless tobacco. She reports that she does not drink alcohol and does not use drugs.  ROS: Pertinent ROS in HPI  Physical Exam: BP (!) 142/55   Pulse 72   Ht 5' 1"$  (1.549 m)   Wt 140 lb (63.5 kg)   BMI 26.45 kg/m   Constitutional:  Well nourished. Alert and oriented, No acute distress. HEENT: [place] AT, moist mucus membranes.  Trachea midline Cardiovascular: No clubbing, cyanosis, or edema. Respiratory: Normal respiratory effort, no increased work of breathing. Neurologic: Grossly intact, no focal deficits, moving all 4 extremities. Psychiatric: Normal mood and affect.    Laboratory Data: N/A    Pertinent Imaging: N/A   Assessment & Plan:    1. Recurrent UTI -Continue Keflex 250 mg daily  2. Vaginal atrophy -continue applying the vaginal estrogen cream three nights weekly  3. Mixed incontinence -continue the Myrbetriq 50 mg daily                                         Return in about 1 year (around 05/13/2023) for PVR and OAB questionnaire.  These notes generated with voice recognition software. I apologize for typographical errors.  SSabina [address] [place] [phone]

## 2022-05-12 ENCOUNTER — Ambulatory Visit: Payer: PPO | Admitting: Urology

## 2022-05-12 ENCOUNTER — Encounter: Payer: Self-pay | Admitting: Urology

## 2022-05-12 VITALS — BP 142/55 | HR 72 | Ht 61.0 in | Wt 140.0 lb

## 2022-05-12 DIAGNOSIS — N390 Urinary tract infection, site not specified: Secondary | ICD-10-CM

## 2022-05-12 DIAGNOSIS — N3946 Mixed incontinence: Secondary | ICD-10-CM

## 2022-05-12 DIAGNOSIS — Z87440 Personal history of urinary (tract) infections: Secondary | ICD-10-CM

## 2022-05-12 DIAGNOSIS — N952 Postmenopausal atrophic vaginitis: Secondary | ICD-10-CM | POA: Diagnosis not present

## 2022-05-12 LAB — BLADDER SCAN AMB NON-IMAGING: Scan Result: 30

## 2022-05-15 DIAGNOSIS — N1831 Chronic kidney disease, stage 3a: Secondary | ICD-10-CM | POA: Diagnosis not present

## 2022-05-15 DIAGNOSIS — R54 Age-related physical debility: Secondary | ICD-10-CM | POA: Diagnosis not present

## 2022-05-15 DIAGNOSIS — Z23 Encounter for immunization: Secondary | ICD-10-CM | POA: Diagnosis not present

## 2022-05-15 DIAGNOSIS — M545 Low back pain, unspecified: Secondary | ICD-10-CM | POA: Diagnosis not present

## 2022-05-15 DIAGNOSIS — F419 Anxiety disorder, unspecified: Secondary | ICD-10-CM | POA: Diagnosis not present

## 2022-05-15 DIAGNOSIS — E1122 Type 2 diabetes mellitus with diabetic chronic kidney disease: Secondary | ICD-10-CM | POA: Diagnosis not present

## 2022-05-15 DIAGNOSIS — Z79899 Other long term (current) drug therapy: Secondary | ICD-10-CM | POA: Diagnosis not present

## 2022-05-15 DIAGNOSIS — D509 Iron deficiency anemia, unspecified: Secondary | ICD-10-CM | POA: Diagnosis not present

## 2022-05-15 DIAGNOSIS — G8929 Other chronic pain: Secondary | ICD-10-CM | POA: Diagnosis not present

## 2022-05-15 DIAGNOSIS — Z0000 Encounter for general adult medical examination without abnormal findings: Secondary | ICD-10-CM | POA: Diagnosis not present

## 2022-05-15 DIAGNOSIS — K219 Gastro-esophageal reflux disease without esophagitis: Secondary | ICD-10-CM | POA: Diagnosis not present

## 2022-06-02 DIAGNOSIS — L578 Other skin changes due to chronic exposure to nonionizing radiation: Secondary | ICD-10-CM | POA: Diagnosis not present

## 2022-06-02 DIAGNOSIS — D485 Neoplasm of uncertain behavior of skin: Secondary | ICD-10-CM | POA: Diagnosis not present

## 2022-06-02 DIAGNOSIS — Z85828 Personal history of other malignant neoplasm of skin: Secondary | ICD-10-CM | POA: Diagnosis not present

## 2022-06-02 DIAGNOSIS — Z859 Personal history of malignant neoplasm, unspecified: Secondary | ICD-10-CM | POA: Diagnosis not present

## 2022-06-02 DIAGNOSIS — L858 Other specified epidermal thickening: Secondary | ICD-10-CM | POA: Diagnosis not present

## 2022-06-02 DIAGNOSIS — L570 Actinic keratosis: Secondary | ICD-10-CM | POA: Diagnosis not present

## 2022-06-02 DIAGNOSIS — Z85820 Personal history of malignant melanoma of skin: Secondary | ICD-10-CM | POA: Diagnosis not present

## 2022-06-02 DIAGNOSIS — C44619 Basal cell carcinoma of skin of left upper limb, including shoulder: Secondary | ICD-10-CM | POA: Diagnosis not present

## 2022-06-26 DIAGNOSIS — L905 Scar conditions and fibrosis of skin: Secondary | ICD-10-CM | POA: Diagnosis not present

## 2022-06-26 DIAGNOSIS — C4491 Basal cell carcinoma of skin, unspecified: Secondary | ICD-10-CM | POA: Diagnosis not present

## 2022-07-05 DIAGNOSIS — H7013 Chronic mastoiditis, bilateral: Secondary | ICD-10-CM | POA: Diagnosis not present

## 2022-07-05 DIAGNOSIS — H7202 Central perforation of tympanic membrane, left ear: Secondary | ICD-10-CM | POA: Diagnosis not present

## 2022-07-10 DIAGNOSIS — H04222 Epiphora due to insufficient drainage, left lacrimal gland: Secondary | ICD-10-CM | POA: Diagnosis not present

## 2022-07-10 DIAGNOSIS — H353131 Nonexudative age-related macular degeneration, bilateral, early dry stage: Secondary | ICD-10-CM | POA: Diagnosis not present

## 2022-07-10 DIAGNOSIS — H43813 Vitreous degeneration, bilateral: Secondary | ICD-10-CM | POA: Diagnosis not present

## 2022-07-10 DIAGNOSIS — Z961 Presence of intraocular lens: Secondary | ICD-10-CM | POA: Diagnosis not present

## 2022-07-14 DIAGNOSIS — M79675 Pain in left toe(s): Secondary | ICD-10-CM | POA: Diagnosis not present

## 2022-07-14 DIAGNOSIS — M79674 Pain in right toe(s): Secondary | ICD-10-CM | POA: Diagnosis not present

## 2022-07-14 DIAGNOSIS — B351 Tinea unguium: Secondary | ICD-10-CM | POA: Diagnosis not present

## 2022-07-24 ENCOUNTER — Other Ambulatory Visit: Payer: Self-pay | Admitting: Urology

## 2022-07-25 ENCOUNTER — Other Ambulatory Visit: Payer: Self-pay | Admitting: Family Medicine

## 2022-07-25 MED ORDER — CEPHALEXIN 250 MG PO CAPS: 250.0000 mg | ORAL_CAPSULE | Freq: Every day | ORAL | 1 refills | Status: DC

## 2022-08-16 ENCOUNTER — Telehealth: Payer: Self-pay | Admitting: *Deleted

## 2022-08-16 DIAGNOSIS — N390 Urinary tract infection, site not specified: Secondary | ICD-10-CM | POA: Diagnosis not present

## 2022-08-16 DIAGNOSIS — R350 Frequency of micturition: Secondary | ICD-10-CM | POA: Diagnosis not present

## 2022-08-16 NOTE — Telephone Encounter (Signed)
Spoke with daughter and pt will be out of town for a week and can not come. Per daughter she will take patient to urgent care to be treated. I apologized and explained that we where willing to work her in tomorrow but daughter declined due to her own dentist appt.

## 2022-08-16 NOTE — Telephone Encounter (Signed)
Spoke with daughter and she has a Cytogeneticist tomorrow at 2pm, daughter is asking if pt could double up on keflex for a few days to see if that help?

## 2022-08-16 NOTE — Telephone Encounter (Signed)
Pt daughter called stating that her mom is "hurting down below" no other symptoms of UTI. Daughter is asking to bring in a urine sample due to no available appointments. Daughter states her mom is going out of town on Sunday. Daughter confirmed pt is taking Keflex, premarin, AZO and Myrbetriq. I explained this is not a symptom of UTI but per daughter when her mom "hurt when making water" it's usually a UTI. I explained again since 07/24/22 these medications should help. Please advise

## 2022-10-27 DIAGNOSIS — M79675 Pain in left toe(s): Secondary | ICD-10-CM | POA: Diagnosis not present

## 2022-10-27 DIAGNOSIS — B351 Tinea unguium: Secondary | ICD-10-CM | POA: Diagnosis not present

## 2022-10-27 DIAGNOSIS — M79674 Pain in right toe(s): Secondary | ICD-10-CM | POA: Diagnosis not present

## 2022-11-13 DIAGNOSIS — M545 Low back pain, unspecified: Secondary | ICD-10-CM | POA: Diagnosis not present

## 2022-11-13 DIAGNOSIS — F419 Anxiety disorder, unspecified: Secondary | ICD-10-CM | POA: Diagnosis not present

## 2022-11-13 DIAGNOSIS — K219 Gastro-esophageal reflux disease without esophagitis: Secondary | ICD-10-CM | POA: Diagnosis not present

## 2022-11-13 DIAGNOSIS — N1831 Chronic kidney disease, stage 3a: Secondary | ICD-10-CM | POA: Diagnosis not present

## 2022-11-13 DIAGNOSIS — Z1331 Encounter for screening for depression: Secondary | ICD-10-CM | POA: Diagnosis not present

## 2022-11-13 DIAGNOSIS — Z79899 Other long term (current) drug therapy: Secondary | ICD-10-CM | POA: Diagnosis not present

## 2022-11-13 DIAGNOSIS — R1031 Right lower quadrant pain: Secondary | ICD-10-CM | POA: Diagnosis not present

## 2022-11-13 DIAGNOSIS — D509 Iron deficiency anemia, unspecified: Secondary | ICD-10-CM | POA: Diagnosis not present

## 2022-11-13 DIAGNOSIS — R1032 Left lower quadrant pain: Secondary | ICD-10-CM | POA: Diagnosis not present

## 2022-11-13 DIAGNOSIS — R54 Age-related physical debility: Secondary | ICD-10-CM | POA: Diagnosis not present

## 2022-11-13 DIAGNOSIS — E1122 Type 2 diabetes mellitus with diabetic chronic kidney disease: Secondary | ICD-10-CM | POA: Diagnosis not present

## 2022-11-13 DIAGNOSIS — G8929 Other chronic pain: Secondary | ICD-10-CM | POA: Diagnosis not present

## 2022-11-16 ENCOUNTER — Other Ambulatory Visit: Payer: Self-pay | Admitting: *Deleted

## 2022-11-16 DIAGNOSIS — D509 Iron deficiency anemia, unspecified: Secondary | ICD-10-CM

## 2022-11-16 NOTE — Progress Notes (Signed)
cbc

## 2022-11-17 ENCOUNTER — Other Ambulatory Visit: Payer: Self-pay

## 2022-11-17 ENCOUNTER — Inpatient Hospital Stay: Payer: PPO | Attending: Oncology

## 2022-11-17 ENCOUNTER — Other Ambulatory Visit: Payer: Self-pay | Admitting: Oncology

## 2022-11-17 DIAGNOSIS — D509 Iron deficiency anemia, unspecified: Secondary | ICD-10-CM | POA: Diagnosis not present

## 2022-11-17 DIAGNOSIS — Z803 Family history of malignant neoplasm of breast: Secondary | ICD-10-CM | POA: Diagnosis not present

## 2022-11-17 LAB — CBC WITH DIFFERENTIAL/PLATELET
Abs Immature Granulocytes: 0.02 10*3/uL (ref 0.00–0.07)
Basophils Absolute: 0.1 10*3/uL (ref 0.0–0.1)
Basophils Relative: 2 %
Eosinophils Absolute: 0.5 10*3/uL (ref 0.0–0.5)
Eosinophils Relative: 7 %
HCT: 23.9 % — ABNORMAL LOW (ref 36.0–46.0)
Hemoglobin: 6.7 g/dL — CL (ref 12.0–15.0)
Immature Granulocytes: 0 %
Lymphocytes Relative: 25 %
Lymphs Abs: 1.7 10*3/uL (ref 0.7–4.0)
MCH: 18.8 pg — ABNORMAL LOW (ref 26.0–34.0)
MCHC: 28 g/dL — ABNORMAL LOW (ref 30.0–36.0)
MCV: 66.9 fL — ABNORMAL LOW (ref 80.0–100.0)
Monocytes Absolute: 0.7 10*3/uL (ref 0.1–1.0)
Monocytes Relative: 10 %
Neutro Abs: 3.8 10*3/uL (ref 1.7–7.7)
Neutrophils Relative %: 56 %
Platelets: 462 10*3/uL — ABNORMAL HIGH (ref 150–400)
RBC: 3.57 MIL/uL — ABNORMAL LOW (ref 3.87–5.11)
RDW: 18.4 % — ABNORMAL HIGH (ref 11.5–15.5)
Smear Review: NORMAL
WBC: 6.8 10*3/uL (ref 4.0–10.5)
nRBC: 0 % (ref 0.0–0.2)

## 2022-11-17 LAB — IRON AND TIBC
Iron: 15 ug/dL — ABNORMAL LOW (ref 28–170)
Saturation Ratios: 4 % — ABNORMAL LOW (ref 10.4–31.8)
TIBC: 419 ug/dL (ref 250–450)
UIBC: 404 ug/dL

## 2022-11-17 LAB — SAMPLE TO BLOOD BANK

## 2022-11-17 LAB — PREPARE RBC (CROSSMATCH)

## 2022-11-17 LAB — FERRITIN: Ferritin: 4 ng/mL — ABNORMAL LOW (ref 11–307)

## 2022-11-17 NOTE — Progress Notes (Unsigned)
CRITICAL VALUE STICKER  CRITICAL VALUE: hgb 6.7  DATE & TIME NOTIFIED:  11/17/22 @ 1515  MD NOTIFIED: Orlie Dakin  TIME OF NOTIFICATION: 1517  RESPONSE:  Plan for blood transfusion

## 2022-11-20 ENCOUNTER — Inpatient Hospital Stay: Payer: PPO

## 2022-11-20 ENCOUNTER — Inpatient Hospital Stay (HOSPITAL_BASED_OUTPATIENT_CLINIC_OR_DEPARTMENT_OTHER): Payer: PPO | Admitting: Oncology

## 2022-11-20 ENCOUNTER — Encounter: Payer: Self-pay | Admitting: Oncology

## 2022-11-20 ENCOUNTER — Inpatient Hospital Stay: Payer: PPO | Admitting: Oncology

## 2022-11-20 VITALS — BP 137/76 | HR 84 | Temp 97.8°F | Wt 135.0 lb

## 2022-11-20 DIAGNOSIS — D509 Iron deficiency anemia, unspecified: Secondary | ICD-10-CM | POA: Diagnosis not present

## 2022-11-20 MED ORDER — DIPHENHYDRAMINE HCL 50 MG/ML IJ SOLN
25.0000 mg | Freq: Once | INTRAMUSCULAR | Status: AC
  Administered 2022-11-20: 25 mg via INTRAVENOUS
  Filled 2022-11-20: qty 1

## 2022-11-20 MED ORDER — ACETAMINOPHEN 325 MG PO TABS
650.0000 mg | ORAL_TABLET | Freq: Once | ORAL | Status: AC
  Administered 2022-11-20: 650 mg via ORAL
  Filled 2022-11-20: qty 2

## 2022-11-20 MED ORDER — SODIUM CHLORIDE 0.9% IV SOLUTION
250.0000 mL | Freq: Once | INTRAVENOUS | Status: AC
  Administered 2022-11-20: 250 mL via INTRAVENOUS
  Filled 2022-11-20: qty 250

## 2022-11-20 NOTE — Progress Notes (Signed)
[facility]  [phone] [phone]) [phone]  ID: Katrinka Blazing OB: 07/07/1931  MR#: [id number]  NGE#:[id number]  Patient Care Team: Myrene Buddy, NP as PCP - General (Internal Medicine)  CHIEF COMPLAINT: Iron deficiency anemia.  INTERVAL HISTORY: The patient is an 89 year old female who was noted to have a significantly reduced hemoglobin on routine blood work.  She is accompanied by her daughter today.  She currently feels well and is asymptomatic.  She does not complain of any weakness and fatigue.  Good appetite and denies weight loss.  She has no chest pain, shortness of breath, cough, or hemoptysis.  She denies any nausea, vomiting, constipation, or diarrhea.  She denies any melena or hematochezia.  She has no urinary complaints.  Patient offers no specific complaints today.  REVIEW OF SYSTEMS:   Review of Systems  Constitutional: Negative.  Negative for fever, malaise/fatigue and weight loss.  Respiratory: Negative.  Negative for cough, hemoptysis and shortness of breath.   Cardiovascular: Negative.  Negative for chest pain and leg swelling.  Gastrointestinal: Negative.  Negative for abdominal pain, blood in stool and melena.  Genitourinary: Negative.  Negative for hematuria.  Musculoskeletal: Negative.  Negative for back pain.  Skin: Negative.  Negative for rash.  Neurological: Negative.  Negative for dizziness, focal weakness, weakness and headaches.  Psychiatric/Behavioral: Negative.  The patient is not nervous/anxious.     As per HPI. Otherwise, a complete review of systems is negative.  PAST MEDICAL HISTORY: Past Medical History:  Diagnosis Date   Anxiety    Cancer ([facility])    melanoma   Cancer ([facility])    squamous cell   Endometriosis    GERD (gastroesophageal reflux disease)    History of stomach ulcers    Kidney failure    Recurrent UTI (urinary tract infection)    Rheumatoid arthritis ([facility])     PAST SURGICAL  HISTORY: Past Surgical History:  Procedure Laterality Date   ABDOMINAL HYSTERECTOMY     APPENDECTOMY     CATARACT EXTRACTION     CHOLECYSTECTOMY     COLON SURGERY     ESOPHAGOGASTRODUODENOSCOPY (EGD) WITH PROPOFOL N/A 03/15/2016   Procedure: ESOPHAGOGASTRODUODENOSCOPY (EGD) WITH PROPOFOL;  Surgeon: Scot Jun, MD;  Location: [facility] ENDOSCOPY;  Service: Endoscopy;  Laterality: N/A;   INNER EAR SURGERY     wears hearing aide   TONSILLECTOMY      FAMILY HISTORY: Family History  Problem Relation Age of Onset   CVA Father    Breast cancer Cousin        mat cousin   Prostate cancer Neg Hx    Kidney cancer Neg Hx    Kidney disease Neg Hx    Bladder Cancer Neg Hx     ADVANCED DIRECTIVES (Y/N):  N  HEALTH MAINTENANCE: Social History   Tobacco Use   Smoking status: Never   Smokeless tobacco: Never  Substance Use Topics   Alcohol use: No   Drug use: No     Colonoscopy:  PAP:  Bone density:  Lipid panel:  Allergies  Allergen Reactions   Ciprofloxacin Hcl Rash   Codeine Sulfate     Other reaction(s): Unknown   Doxycycline Hyclate Rash   Penicillin G Rash   Sulfamethoxazole-Trimethoprim Rash    Current Outpatient Medications  Medication Sig Dispense Refill   cephALEXin (KEFLEX) 250 MG capsule Take 1 capsule (250 mg total) by mouth daily. 90 capsule 1   conjugated estrogens (PREMARIN) vaginal cream Place 1 Applicatorful vaginally  daily. Apply 0.5mg  (pea-sized amount)  just inside the vaginal introitus with a finger-tip on Monday, Wednesday and Friday nights. 30 g 12   hydrOXYzine (ATARAX/VISTARIL) 25 MG tablet Take 25 mg by mouth daily.     MYRBETRIQ 50 MG TB24 tablet TAKE (1) TABLET BY MOUTH EVERY DAY 90 tablet 3   nitrofurantoin, macrocrystal-monohydrate, (MACROBID) 100 MG capsule Take 1 capsule (100 mg total) by mouth every 12 (twelve) hours. 14 capsule 0   omeprazole (PRILOSEC) 40 MG capsule Take 1 capsule by mouth 2 (two) times daily. Take 30 minutes before  breakfast and take 30 minutes before dinner.     Phenazopyridine HCl (AZO URINARY PAIN PO) Take by mouth as needed.     sertraline (ZOLOFT) 50 MG tablet      Ascorbic Acid (VITAMIN C PO) Take by mouth. (Patient not taking: Reported on 11/20/2022)     Cholecalciferol (VITAMIN D3 PO) Take by mouth. (Patient not taking: Reported on 11/20/2022)     lidocaine (LIDODERM) 5 % 1 patch daily. (Patient not taking: Reported on 11/20/2022)     mupirocin ointment (BACTROBAN) 2 %  (Patient not taking: Reported on 11/20/2022)     No current facility-administered medications for this visit.    OBJECTIVE: Vitals:   11/20/22 0837  BP: 137/76  Pulse: 84  Temp: 97.8 F (36.6 C)  SpO2: 100%     Body mass index is 25.51 kg/m.    ECOG FS:0 - Asymptomatic  General: Well-developed, well-nourished, no acute distress. Eyes: Pink conjunctiva, anicteric sclera. HEENT: Normocephalic, moist mucous membranes. Lungs: No audible wheezing or coughing. Heart: Regular rate and rhythm. Abdomen: Soft, nontender, no obvious distention. Musculoskeletal: No edema, cyanosis, or clubbing. Neuro: Alert, answering all questions appropriately. Cranial nerves grossly intact. Skin: No rashes or petechiae noted. Psych: Normal affect. Lymphatics: No cervical, calvicular, axillary or inguinal LAD.   LAB RESULTS:  Lab Results  Component Value Date   NA 137 05/08/2021   K 3.5 05/08/2021   CL 106 05/08/2021   CO2 22 05/08/2021   GLUCOSE 154 (H) 05/08/2021   BUN 20 05/08/2021   CREATININE 0.84 05/08/2021   CALCIUM 8.7 (L) 05/08/2021   PROT 7.0 01/12/2016   ALBUMIN 3.6 01/12/2016   AST 17 01/12/2016   ALT 12 (L) 01/12/2016   ALKPHOS 68 01/12/2016   BILITOT 0.2 (L) 01/12/2016   GFRNONAA >60 05/08/2021   GFRAA >60 08/25/2016    Lab Results  Component Value Date   WBC 6.8 11/17/2022   NEUTROABS 3.8 11/17/2022   HGB 6.7 (LL) 11/17/2022   HCT 23.9 (L) 11/17/2022   MCV 66.9 (L) 11/17/2022   PLT 462 (H) 11/17/2022    Lab Results  Component Value Date   IRON 15 (L) 11/17/2022   TIBC 419 11/17/2022   IRONPCTSAT 4 (L) 11/17/2022   Lab Results  Component Value Date   FERRITIN 4 (L) 11/17/2022     STUDIES: No results found.  ASSESSMENT: Iron deficiency anemia.  PLAN:    Iron deficiency anemia: Unclear etiology.  Patient does not remember when she had her last colonoscopy.  Patient noted to have a significantly decreased hemoglobin of 6.7 as well as reduced iron stores.  Proceed with 2 units packed red blood cells today.  Patient will return to clinic in 2 weeks with repeat laboratory work, further evaluation, and consideration of additional blood or possible Feraheme if needed.  Will consider full anemia workup in the future.  I spent a total of 60 minutes reviewing  chart data, face-to-face evaluation with the patient, counseling and coordination of care as detailed above.   Patient expressed understanding and was in agreement with this plan. She also understands that She can call clinic at any time with any questions, concerns, or complaints.     Jeralyn Ruths, MD   11/20/2022 1:10 PM

## 2022-11-20 NOTE — Progress Notes (Signed)
Patient is currently having a UTI,

## 2022-11-21 LAB — TYPE AND SCREEN
ABO/RH(D): O POS
Antibody Screen: NEGATIVE
Unit division: 0
Unit division: 0

## 2022-11-21 LAB — BPAM RBC
Blood Product Expiration Date: 202409202359
Blood Product Expiration Date: 202409202359
ISSUE DATE / TIME: 202408260938
ISSUE DATE / TIME: 202408261200
Unit Type and Rh: 5100
Unit Type and Rh: 5100

## 2022-12-01 ENCOUNTER — Encounter: Payer: Self-pay | Admitting: Physician Assistant

## 2022-12-01 ENCOUNTER — Ambulatory Visit: Payer: PPO | Admitting: Physician Assistant

## 2022-12-01 VITALS — BP 119/77 | HR 78 | Wt 135.0 lb

## 2022-12-01 DIAGNOSIS — R82998 Other abnormal findings in urine: Secondary | ICD-10-CM

## 2022-12-01 DIAGNOSIS — Z87440 Personal history of urinary (tract) infections: Secondary | ICD-10-CM

## 2022-12-01 DIAGNOSIS — R1030 Lower abdominal pain, unspecified: Secondary | ICD-10-CM

## 2022-12-01 DIAGNOSIS — N390 Urinary tract infection, site not specified: Secondary | ICD-10-CM | POA: Diagnosis not present

## 2022-12-01 LAB — URINALYSIS, COMPLETE
Bilirubin, UA: NEGATIVE
Glucose, UA: NEGATIVE
Ketones, UA: NEGATIVE
Nitrite, UA: POSITIVE — AB
Protein,UA: NEGATIVE
Specific Gravity, UA: 1.025 (ref 1.005–1.030)
Urobilinogen, Ur: 0.2 mg/dL (ref 0.2–1.0)
pH, UA: 5.5 (ref 5.0–7.5)

## 2022-12-01 LAB — MICROSCOPIC EXAMINATION: WBC, UA: >30 /HPF — AB (ref 0–5)

## 2022-12-01 MED ORDER — FOSFOMYCIN TROMETHAMINE 3 G PO PACK
3.0000 g | PACK | Freq: Once | ORAL | 0 refills | Status: AC
Start: 2022-12-01 — End: 2022-12-01

## 2022-12-01 NOTE — Progress Notes (Signed)
In and Out Catheterization  Patient is present today for a I & O catheterization due to urinary leakage. Patient was cleaned and prepped in a sterile fashion with betadine . A 14 FR cath was inserted no complications were noted , 120 ml of urine return was noted, urine was yellow in color. A clean urine sample was collected for UTI. Bladder was drained  And catheter was removed with out difficulty.    Performed by: Malcolm Metro RMA  Follow up/ Additional notes: F/UP as scheduled

## 2022-12-01 NOTE — Progress Notes (Signed)
12/01/2022 9:41 AM   Heidi Arias November 03, 1931 [id number]  CC: Chief Complaint  Patient presents with   cath ua   HPI: Heidi Arias is a 89 y.o. female with PMH recurrent UTI on suppressive Keflex 250 mg daily, OAB wet with mixed incontinence on Myrbetriq 50 mg, GSM on vaginal estrogen cream, and high risk hematuria with benign workup and cytology in 2018 who presents today for evaluation of possible UTI.  She is accompanied today by her daughter, Heidi Arias, who contributes to HPI.  She saw her PCP on 11/13/2022 and follow-up and was having some lower abdominal discomfort at the time.  UA appeared grossly infected with nitrites, pyuria, and bacteriuria and she was treated with Macrobid 100 mg twice daily x 7 days.  I do not see a urine culture.  Today she reports her symptoms resolved on Macrobid, but quickly returned.  She is still having lower abdominal pain, but denies fever, chills, nausea, or vomiting.  This would represent her third UTI in the past 4 months despite daily suppressive antibiotics.  She tends to grow MDR organisms, lately resistant to first generation cephalosporins.  In-office catheterized UA today positive for trace intact blood, nitrites, and 2+ leukocytes; urine microscopy with >30 WBCs/HPF and many bacteria.  Measured residual 120 mL.  PMH: Past Medical History:  Diagnosis Date   Anxiety    Cancer ([facility])    melanoma   Cancer ([facility])    squamous cell   Endometriosis    GERD (gastroesophageal reflux disease)    History of stomach ulcers    Kidney failure    Recurrent UTI (urinary tract infection)    Rheumatoid arthritis ([facility])     Surgical History: Past Surgical History:  Procedure Laterality Date   ABDOMINAL HYSTERECTOMY     APPENDECTOMY     CATARACT EXTRACTION     CHOLECYSTECTOMY     COLON SURGERY     ESOPHAGOGASTRODUODENOSCOPY (EGD) WITH PROPOFOL N/A 03/15/2016   Procedure: ESOPHAGOGASTRODUODENOSCOPY (EGD) WITH PROPOFOL;  Surgeon:  Scot Jun, MD;  Location: [facility] END[facility]COPY;  Service: Endoscopy;  Laterality: N/A;   INNER EAR SURGERY     wears hearing aide   TONSILLECTOMY      Home Medications:  Allergies as of 12/01/2022       Reactions   Ciprofloxacin Hcl Rash   Codeine Sulfate    Other reaction(s): Unknown   Doxycycline Hyclate Rash   Penicillin G Rash   Sulfamethoxazole-trimethoprim Rash        Medication List        Accurate as of December 01, 2022  9:41 AM. If you have any questions, ask your nurse or doctor.          AZO URINARY PAIN PO Take by mouth as needed.   cephALEXin 250 MG capsule Commonly known as: KEFLEX Take 1 capsule (250 mg total) by mouth daily.   hydrOXYzine 25 MG tablet Commonly known as: ATARAX Take 25 mg by mouth daily.   lidocaine 5 % Commonly known as: LIDODERM 1 patch daily.   mupirocin ointment 2 % Commonly known as: BACTROBAN   Myrbetriq 50 MG Tb24 tablet Generic drug: mirabegron ER TAKE (1) TABLET BY MOUTH EVERY DAY   nitrofurantoin (macrocrystal-monohydrate) 100 MG capsule Commonly known as: MACROBID Take 1 capsule (100 mg total) by mouth every 12 (twelve) hours.   omeprazole 40 MG capsule Commonly known as: PRILOSEC Take 1 capsule by mouth 2 (two) times daily. Take 30 minutes before breakfast and  take 30 minutes before dinner.   Premarin vaginal cream Generic drug: conjugated estrogens Place 1 Applicatorful vaginally daily. Apply 0.5mg  (pea-sized amount)  just inside the vaginal introitus with a finger-tip on Monday, Wednesday and Friday nights.   sertraline 50 MG tablet Commonly known as: ZOLOFT   VITAMIN C PO Take by mouth.   VITAMIN D3 PO Take by mouth.        Allergies:  Allergies  Allergen Reactions   Ciprofloxacin Hcl Rash   Codeine Sulfate     Other reaction(s): Unknown   Doxycycline Hyclate Rash   Penicillin G Rash   Sulfamethoxazole-Trimethoprim Rash    Family History: Family History  Problem Relation Age of  Onset   CVA Father    Breast cancer Cousin        mat cousin   Prostate cancer Neg Hx    Kidney cancer Neg Hx    Kidney disease Neg Hx    Bladder Cancer Neg Hx     Social History:   reports that she has never smoked. She has never used smokeless tobacco. She reports that she does not drink alcohol and does not use drugs.  Physical Exam: BP 119/77   Pulse 78   Wt 135 lb (61.2 kg)   BMI 25.51 kg/m   Constitutional:  Alert and oriented, no acute distress, nontoxic appearing HEENT: [place], AT Cardiovascular: No clubbing, cyanosis, or edema Respiratory: Normal respiratory effort, no increased work of breathing Skin: No rashes, bruises or suspicious lesions Neurologic: Grossly intact, no focal deficits, moving all 4 extremities Psychiatric: Normal mood and affect  Laboratory Data: Results for orders placed or performed in visit on 12/01/22  Microscopic Examination   Urine  Result Value Ref Range   WBC, UA >30 (A) 0 - 5 /hpf   RBC, Urine 0-2 0 - 2 /hpf   Epithelial Cells (non renal) 0-10 0 - 10 /hpf   Bacteria, UA Many (A) None seen/Few  Urinalysis, Complete  Result Value Ref Range   Specific Gravity, UA 1.025 1.005 - 1.030   pH, UA 5.5 5.0 - 7.5   Color, UA Yellow Yellow   Appearance Ur Cloudy (A) Clear   Leukocytes,UA 2+ (A) Negative   Protein,UA Negative Negative/Trace   Glucose, UA Negative Negative   Ketones, UA Negative Negative   RBC, UA Trace (A) Negative   Bilirubin, UA Negative Negative   Urobilinogen, Ur 0.2 0.2 - 1.0 mg/dL   Nitrite, UA Positive (A) Negative   Microscopic Examination See below:    Assessment & Plan:   1. Recurrent UTI Recurrent versus persistent UTI.  Cath UA appears grossly infected today, will send for culture and treat with empiric fosfomycin.  She is having frequent breakthrough infections on daily suppressive antibiotics, anticipate due to Keflex resistance.  At this point I would recommend switching her suppressive agent.  We discussed  trying trimethoprim to see if she tolerates that component of Bactrim.  It looks like she has been on trimethoprim in the past, so anticipate this would be fine.  Alternatively, could consider Hiprex given normal renal function.  Will discuss further with her and her daughter once her urine culture results. - Urinalysis, Complete - CULTURE, URINE COMPREHENSIVE - fosfomycin (MONUROL) 3 g PACK; Take 3 g by mouth once for 1 dose.  Dispense: 3 g; Refill: 0   Return for Will call to adjust suppressive abx per culture results.  Carman Ching, PA-C  [facility] [place] [address], [address] [place], [place] [address] [phone]

## 2022-12-04 ENCOUNTER — Inpatient Hospital Stay: Payer: PPO | Attending: Oncology

## 2022-12-04 ENCOUNTER — Inpatient Hospital Stay: Payer: PPO | Admitting: Oncology

## 2022-12-04 DIAGNOSIS — D509 Iron deficiency anemia, unspecified: Secondary | ICD-10-CM | POA: Diagnosis not present

## 2022-12-04 LAB — CBC WITH DIFFERENTIAL/PLATELET
Abs Immature Granulocytes: 0.02 10*3/uL (ref 0.00–0.07)
Basophils Absolute: 0.1 10*3/uL (ref 0.0–0.1)
Basophils Relative: 2 %
Eosinophils Absolute: 0.4 10*3/uL (ref 0.0–0.5)
Eosinophils Relative: 6 %
HCT: 37.4 % (ref 36.0–46.0)
Hemoglobin: 11.3 g/dL — ABNORMAL LOW (ref 12.0–15.0)
Immature Granulocytes: 0 %
Lymphocytes Relative: 26 %
Lymphs Abs: 1.6 10*3/uL (ref 0.7–4.0)
MCH: 22.6 pg — ABNORMAL LOW (ref 26.0–34.0)
MCHC: 30.2 g/dL (ref 30.0–36.0)
MCV: 74.8 fL — ABNORMAL LOW (ref 80.0–100.0)
Monocytes Absolute: 0.5 10*3/uL (ref 0.1–1.0)
Monocytes Relative: 8 %
Neutro Abs: 3.6 10*3/uL (ref 1.7–7.7)
Neutrophils Relative %: 58 %
Platelets: 458 10*3/uL — ABNORMAL HIGH (ref 150–400)
RBC: 5 MIL/uL (ref 3.87–5.11)
RDW: 25.2 % — ABNORMAL HIGH (ref 11.5–15.5)
WBC: 6.2 10*3/uL (ref 4.0–10.5)
nRBC: 0 % (ref 0.0–0.2)

## 2022-12-04 LAB — SAMPLE TO BLOOD BANK

## 2022-12-05 ENCOUNTER — Inpatient Hospital Stay: Payer: PPO

## 2022-12-05 ENCOUNTER — Encounter: Payer: Self-pay | Admitting: Oncology

## 2022-12-05 ENCOUNTER — Inpatient Hospital Stay (HOSPITAL_BASED_OUTPATIENT_CLINIC_OR_DEPARTMENT_OTHER): Payer: PPO | Admitting: Oncology

## 2022-12-05 VITALS — BP 116/81 | HR 75 | Temp 97.6°F | Resp 20 | Wt 135.0 lb

## 2022-12-05 VITALS — BP 144/80 | HR 78

## 2022-12-05 DIAGNOSIS — D509 Iron deficiency anemia, unspecified: Secondary | ICD-10-CM

## 2022-12-05 IMAGING — CT CT HEAD W/O CM
4 series · 17 of 47 positions shown, 19 images · non-contrast
Comparison: 02/05/2016

CLINICAL DATA: Head trauma, minor (Age >= 65y)



[Series 2: head wo · axial · 0.41mm/px · z∈[-182,-57]mm · 7 of 35 slices shown, 9 images]
[im 5/35  brain]
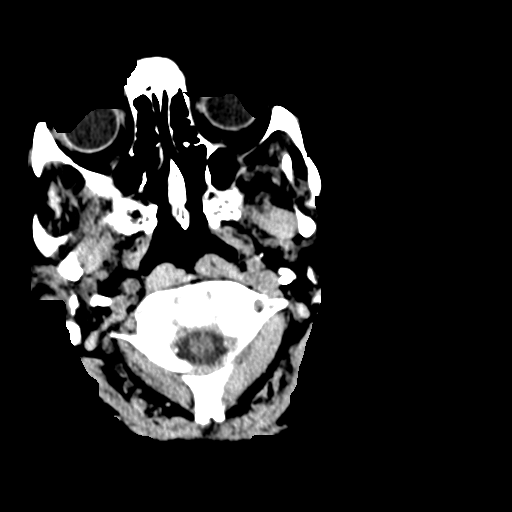
[im 5/35  bone]
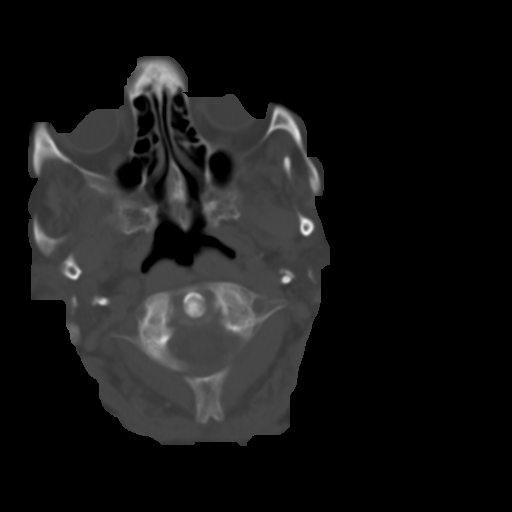
[im 9/35  brain]
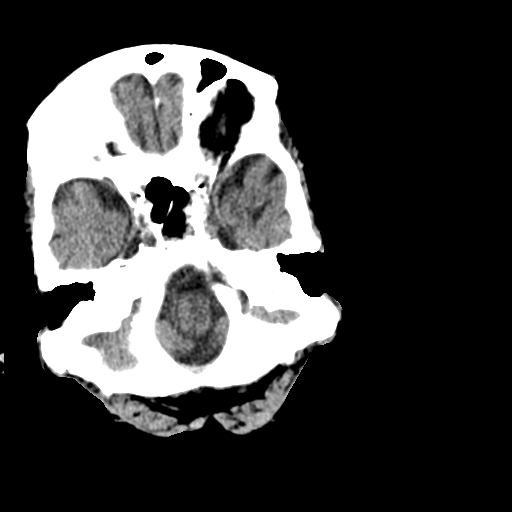
[im 13/35  brain]
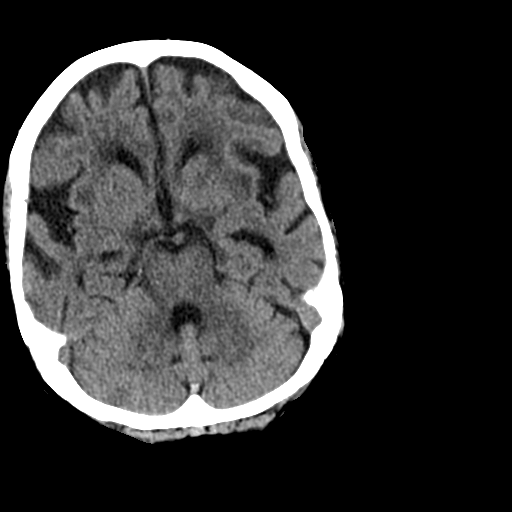
[im 18/35  brain]
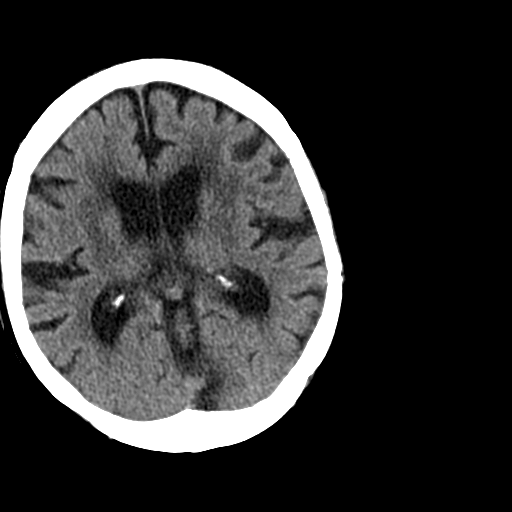
[im 22/35  brain]
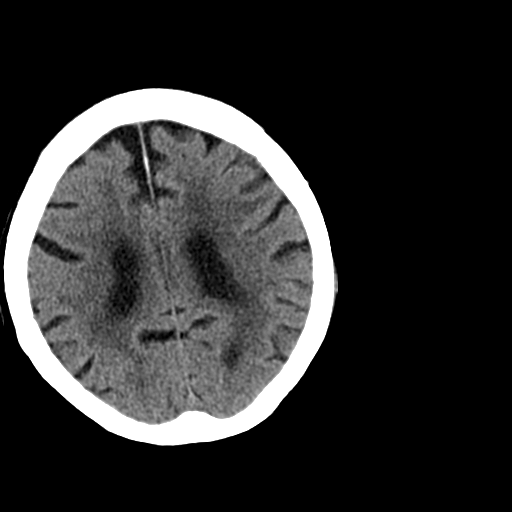
[im 22/35  bone]
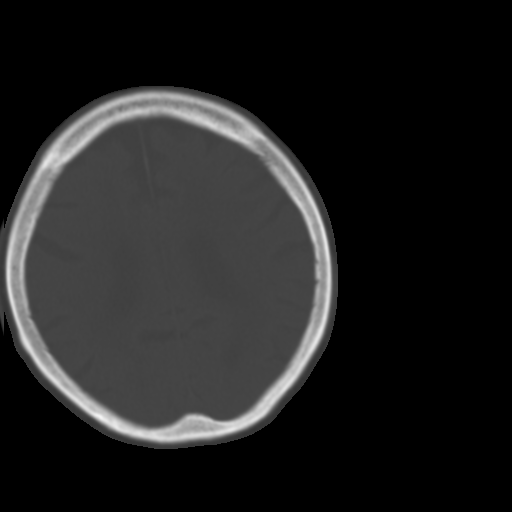
[im 26/35  brain]
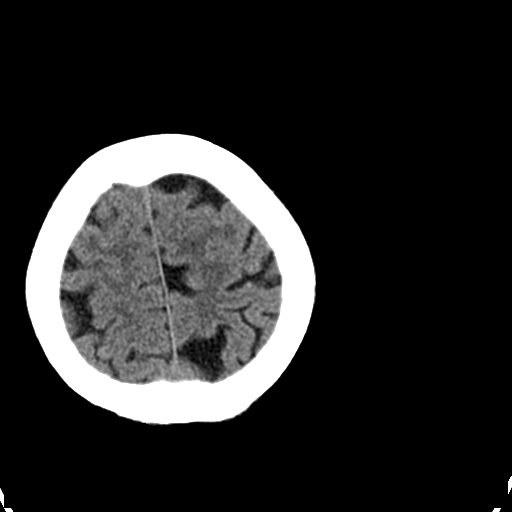
[im 30/35  brain]
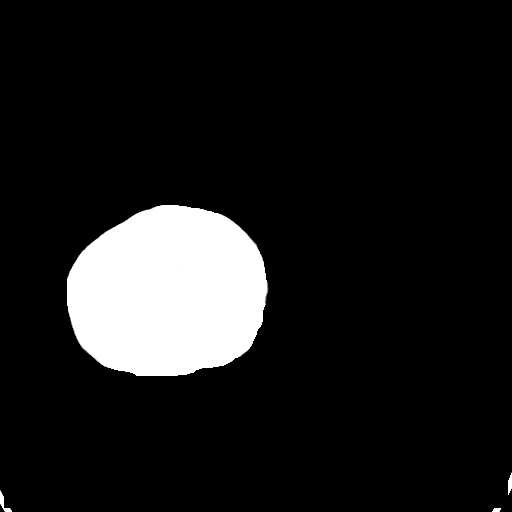

[Series 3: head bone · axial · 0.41mm/px · z∈[-186,-126]mm · 4 of 86 slices shown]
[im 9/86  bone]
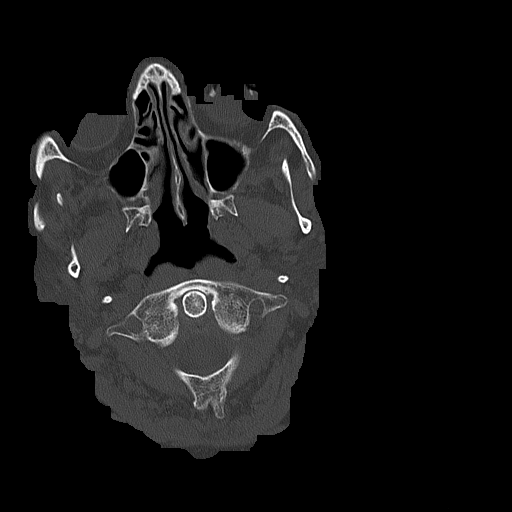
[im 18/86  bone]
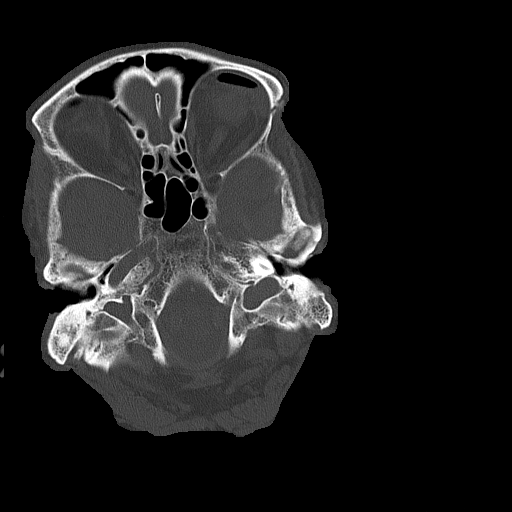
[im 26/86  bone]
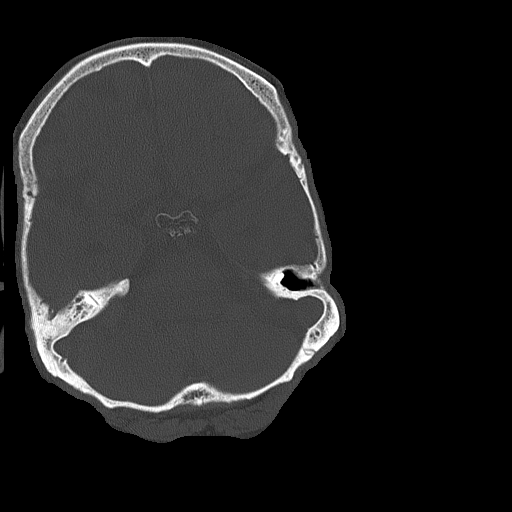
[im 39/86  bone]
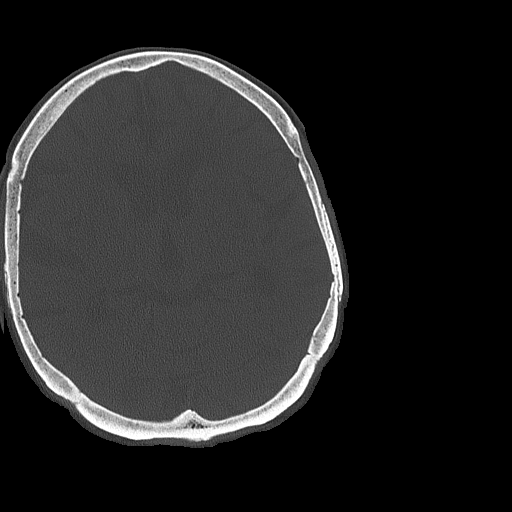

[Series 4: coronal soft tissue · coronal · 0.32mm/px · 3 of 59 slices shown]
[im 20/59  brain]
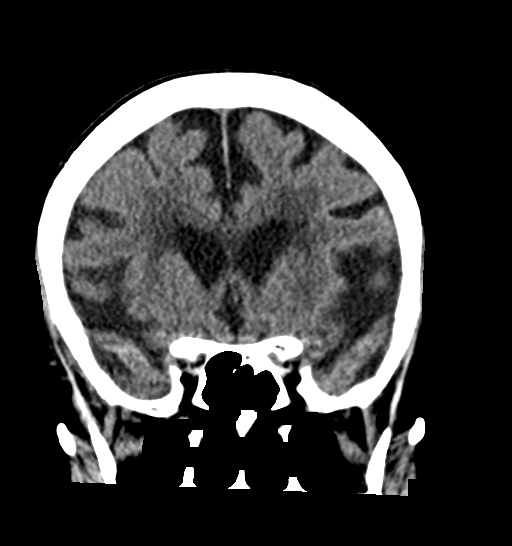
[im 26/59  brain]
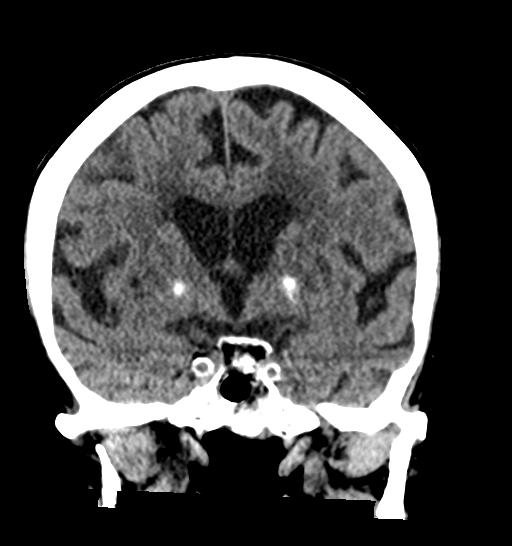
[im 33/59  brain]
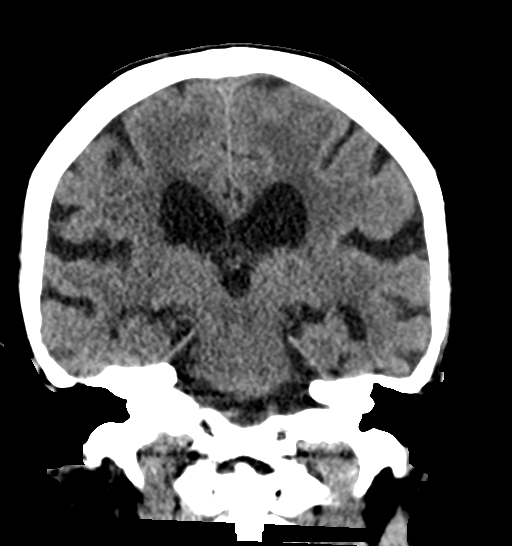

[Series 5: sagittal soft tissue · sagittal · 0.34mm/px · 3 of 55 slices shown]
[im 19/55  brain]
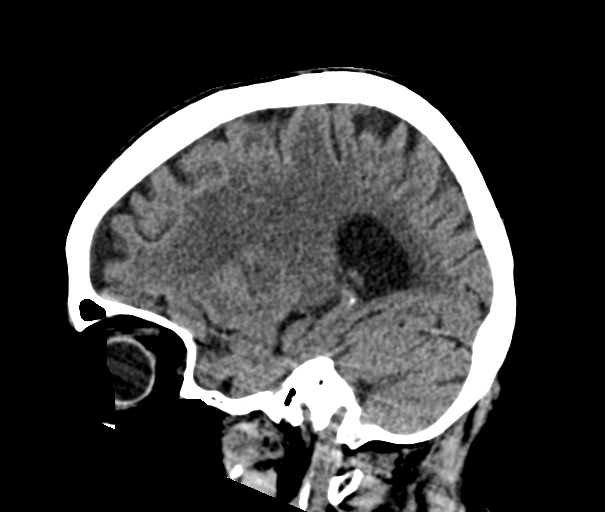
[im 28/55  brain]
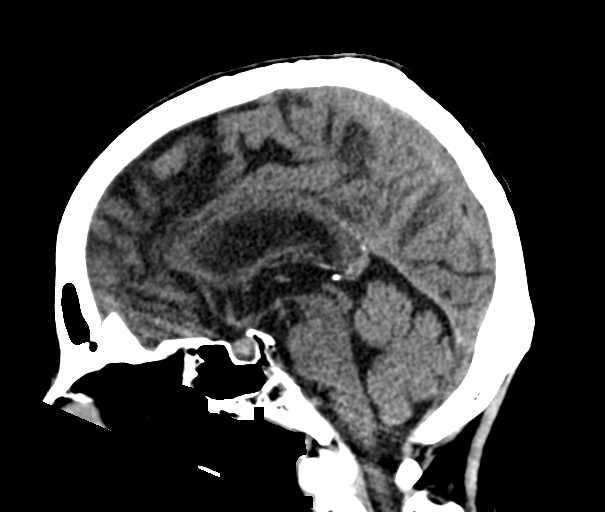
[im 37/55  brain]
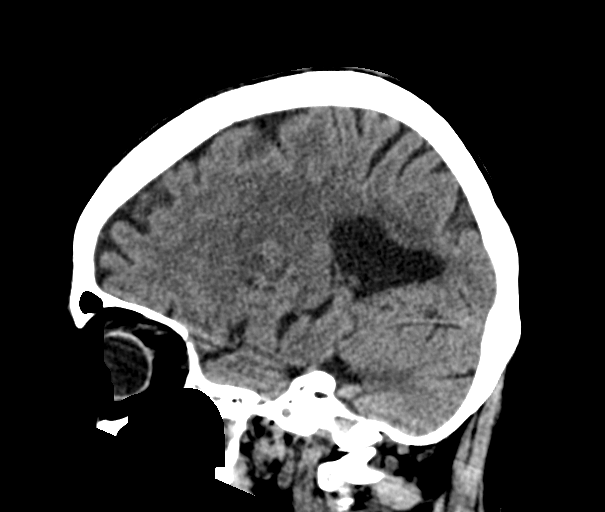

[17 of 47 positions shown; findings below may reference images not displayed]

FINDINGS: Brain: No evidence of acute infarction, hemorrhage, hydrocephalus,
extra-axial collection or mass lesion/mass effect. Moderate
low-density changes within the periventricular and subcortical white
matter compatible with chronic microvascular ischemic change. Mild
diffuse cerebral volume loss.

Vascular: Atherosclerotic calcifications involving the large vessels
of the skull base. No unexpected hyperdense vessel.

Skull: Normal. Negative for fracture or focal lesion.

Sinuses/Orbits: No acute finding.

Other: None.
IMPRESSION: 1. No acute intracranial abnormality.
2. Chronic microvascular ischemic change and cerebral volume loss.

## 2022-12-05 MED ORDER — SODIUM CHLORIDE 0.9 % IV SOLN
510.0000 mg | Freq: Once | INTRAVENOUS | Status: AC
  Administered 2022-12-05: 510 mg via INTRAVENOUS
  Filled 2022-12-05: qty 17

## 2022-12-05 MED ORDER — SODIUM CHLORIDE 0.9% FLUSH
10.0000 mL | Freq: Once | INTRAVENOUS | Status: AC | PRN
  Administered 2022-12-05: 10 mL
  Filled 2022-12-05: qty 10

## 2022-12-05 MED ORDER — SODIUM CHLORIDE 0.9 % IV SOLN
Freq: Once | INTRAVENOUS | Status: AC
  Filled 2022-12-05: qty 250

## 2022-12-05 NOTE — Progress Notes (Signed)
[facility]  Telephone:(336) [phone] Fax:(336) [phone]  ID: Heidi Arias OB: [birth year]  MR#: [id number]  WUX#:[id number]  Patient Care Team: Myrene Buddy, NP as PCP - General (Internal Medicine)  CHIEF COMPLAINT: Iron deficiency anemia.  INTERVAL HISTORY: Patient returns to clinic today for repeat laboratory work, further evaluation, and consideration of IV Feraheme.  She continues to feel well and remains asymptomatic.  She denies any weakness or fatigue today.  She has a good appetite and denies weight loss.  She has no chest pain, shortness of breath, cough, or hemoptysis.  She denies any nausea, vomiting, constipation, or diarrhea.  She denies any melena or hematochezia.  She has no urinary complaints.  Patient offers no specific complaints today.  REVIEW OF SYSTEMS:   Review of Systems  Constitutional: Negative.  Negative for fever, malaise/fatigue and weight loss.  Respiratory: Negative.  Negative for cough, hemoptysis and shortness of breath.   Cardiovascular: Negative.  Negative for chest pain and leg swelling.  Gastrointestinal: Negative.  Negative for abdominal pain, blood in stool and melena.  Genitourinary: Negative.  Negative for hematuria.  Musculoskeletal: Negative.  Negative for back pain.  Skin: Negative.  Negative for rash.  Neurological: Negative.  Negative for dizziness, focal weakness, weakness and headaches.  Psychiatric/Behavioral: Negative.  The patient is not nervous/anxious.     As per HPI. Otherwise, a complete review of systems is negative.  PAST MEDICAL HISTORY: Past Medical History:  Diagnosis Date   Anxiety    Cancer (HCC)    melanoma   Cancer (HCC)    squamous cell   Endometriosis    GERD (gastroesophageal reflux disease)    History of stomach ulcers    Kidney failure    Recurrent UTI (urinary tract infection)    Rheumatoid arthritis (HCC)     PAST SURGICAL HISTORY: Past Surgical History:  Procedure  Laterality Date   ABDOMINAL HYSTERECTOMY     APPENDECTOMY     CATARACT EXTRACTION     CHOLECYSTECTOMY     COLON SURGERY     ESOPHAGOGASTRODUODENOSCOPY (EGD) WITH PROPOFOL N/A 03/15/2016   Procedure: ESOPHAGOGASTRODUODENOSCOPY (EGD) WITH PROPOFOL;  Surgeon: Scot Jun, MD;  Location: [facility] ENDOSCOPY;  Service: Endoscopy;  Laterality: N/A;   INNER EAR SURGERY     wears hearing aide   TONSILLECTOMY      FAMILY HISTORY: Family History  Problem Relation Age of Onset   CVA Father    Breast cancer Cousin        mat cousin   Prostate cancer Neg Hx    Kidney cancer Neg Hx    Kidney disease Neg Hx    Bladder Cancer Neg Hx     ADVANCED DIRECTIVES (Y/N):  N  HEALTH MAINTENANCE: Social History   Tobacco Use   Smoking status: Never   Smokeless tobacco: Never  Substance Use Topics   Alcohol use: No   Drug use: No     Colonoscopy:  PAP:  Bone density:  Lipid panel:  Allergies  Allergen Reactions   Ciprofloxacin Hcl Rash   Codeine Sulfate     Other reaction(s): Unknown   Doxycycline Hyclate Rash   Penicillin G Rash   Sulfamethoxazole-Trimethoprim Rash    Current Outpatient Medications  Medication Sig Dispense Refill   Ascorbic Acid (VITAMIN C PO) Take by mouth.     cephALEXin (KEFLEX) 250 MG capsule Take 1 capsule (250 mg total) by mouth daily. 90 capsule 1   Cholecalciferol (VITAMIN D3 PO) Take by  mouth.     conjugated estrogens (PREMARIN) vaginal cream Place 1 Applicatorful vaginally daily. Apply 0.5mg  (pea-sized amount)  just inside the vaginal introitus with a finger-tip on Monday, Wednesday and Friday nights. 30 g 12   hydrOXYzine (ATARAX/VISTARIL) 25 MG tablet Take 25 mg by mouth daily.     lidocaine (LIDODERM) 5 % 1 patch daily.     mupirocin ointment (BACTROBAN) 2 %      MYRBETRIQ 50 MG TB24 tablet TAKE (1) TABLET BY MOUTH EVERY DAY 90 tablet 3   omeprazole (PRILOSEC) 40 MG capsule Take 1 capsule by mouth 2 (two) times daily. Take 30 minutes before breakfast  and take 30 minutes before dinner.     Phenazopyridine HCl (AZO URINARY PAIN PO) Take by mouth as needed.     sertraline (ZOLOFT) 50 MG tablet      No current facility-administered medications for this visit.    OBJECTIVE: Vitals:   12/05/22 0853  BP: 116/81  Pulse: 75  Resp: 20  Temp: 97.6 F (36.4 C)  SpO2: 100%      Body mass index is 25.51 kg/m.    ECOG FS:0 - Asymptomatic  General: Well-developed, well-nourished, no acute distress.  Sitting in a wheelchair. Eyes: Pink conjunctiva, anicteric sclera. HEENT: Normocephalic, moist mucous membranes. Lungs: No audible wheezing or coughing. Heart: Regular rate and rhythm. Abdomen: Soft, nontender, no obvious distention. Musculoskeletal: No edema, cyanosis, or clubbing. Neuro: Alert, answering all questions appropriately. Cranial nerves grossly intact. Skin: No rashes or petechiae noted. Psych: Normal affect.   LAB RESULTS:  Lab Results  Component Value Date   NA 137 05/08/2021   K 3.5 05/08/2021   CL 106 05/08/2021   CO2 22 05/08/2021   GLUCOSE 154 (H) 05/08/2021   BUN 20 05/08/2021   CREATININE 0.84 05/08/2021   CALCIUM 8.7 (L) 05/08/2021   PROT 7.0 01/12/2016   ALBUMIN 3.6 01/12/2016   AST 17 01/12/2016   ALT 12 (L) 01/12/2016   ALKPHOS 68 01/12/2016   BILITOT 0.2 (L) 01/12/2016   GFRNONAA >60 05/08/2021   GFRAA >60 08/25/2016    Lab Results  Component Value Date   WBC 6.2 12/04/2022   NEUTROABS 3.6 12/04/2022   HGB 11.3 (L) 12/04/2022   HCT 37.4 12/04/2022   MCV 74.8 (L) 12/04/2022   PLT 458 (H) 12/04/2022   Lab Results  Component Value Date   IRON 15 (L) 11/17/2022   TIBC 419 11/17/2022   IRONPCTSAT 4 (L) 11/17/2022   Lab Results  Component Value Date   FERRITIN 4 (L) 11/17/2022     STUDIES: No results found.  ASSESSMENT: Iron deficiency anemia.  PLAN:    Iron deficiency anemia: Unclear etiology.  Patient does not remember when she had her last colonoscopy.  Patient's hemoglobin is  improved significantly to 11.3 with transfusion.  She does not require additional blood today.  Proceed with 510 mg IV Feraheme.  Return to clinic in 4 weeks with repeat laboratory work, further evaluation, and continuation of treatment if needed.  Will consider full anemia workup in the future.  I spent a total of 30 minutes reviewing chart data, face-to-face evaluation with the patient, counseling and coordination of care as detailed above.    Patient expressed understanding and was in agreement with this plan. She also understands that She can call clinic at any time with any questions, concerns, or complaints.     Jeralyn Ruths, MD   12/05/2022 9:15 AM

## 2022-12-05 NOTE — Patient Instructions (Signed)
Iron Sucrose Injection What is this medication? IRON SUCROSE (EYE ern SOO krose) treats low levels of iron (iron deficiency anemia) in people with kidney disease. Iron is a mineral that plays an important role in making red blood cells, which carry oxygen from your lungs to the rest of your body. This medicine may be used for other purposes; ask your health care provider or pharmacist if you have questions. COMMON BRAND NAME(S): Venofer What should I tell my care team before I take this medication? They need to know if you have any of these conditions: Anemia not caused by low iron levels Heart disease High levels of iron in the blood Kidney disease Liver disease An unusual or allergic reaction to iron, other medications, foods, dyes, or preservatives Pregnant or trying to get pregnant Breastfeeding How should I use this medication? This medication is for infusion into a vein. It is given in a hospital or clinic setting. Talk to your care team about the use of this medication in children. While this medication may be prescribed for children as young as 2 years for selected conditions, precautions do apply. Overdosage: If you think you have taken too much of this medicine contact a poison control center or emergency room at once. NOTE: This medicine is only for you. Do not share this medicine with others. What if I miss a dose? Keep appointments for follow-up doses. It is important not to miss your dose. Call your care team if you are unable to keep an appointment. What may interact with this medication? Do not take this medication with any of the following: Deferoxamine Dimercaprol Other iron products This medication may also interact with the following: Chloramphenicol Deferasirox This list may not describe all possible interactions. Give your health care provider a list of all the medicines, herbs, non-prescription drugs, or dietary supplements you use. Also tell them if you smoke,  drink alcohol, or use illegal drugs. Some items may interact with your medicine. What should I watch for while using this medication? Visit your care team regularly. Tell your care team if your symptoms do not start to get better or if they get worse. You may need blood work done while you are taking this medication. You may need to follow a special diet. Talk to your care team. Foods that contain iron include: whole grains/cereals, dried fruits, beans, or peas, leafy green vegetables, and organ meats (liver, kidney). What side effects may I notice from receiving this medication? Side effects that you should report to your care team as soon as possible: Allergic reactions--skin rash, itching, hives, swelling of the face, lips, tongue, or throat Low blood pressure--dizziness, feeling faint or lightheaded, blurry vision Shortness of breath Side effects that usually do not require medical attention (report to your care team if they continue or are bothersome): Flushing Headache Joint pain Muscle pain Nausea Pain, redness, or irritation at injection site This list may not describe all possible side effects. Call your doctor for medical advice about side effects. You may report side effects to FDA at 1-800-FDA-1088. Where should I keep my medication? This medication is given in a hospital or clinic. It will not be stored at home. NOTE: This sheet is a summary. It may not cover all possible information. If you have questions about this medicine, talk to your doctor, pharmacist, or health care provider.  2024 Elsevier/Gold Standard (2022-08-18 00:00:00)

## 2022-12-05 NOTE — Progress Notes (Signed)
Patient tolerated initial Feraheme infusion well, no questions/concerns voiced. Monitored 30 min post transfusion. Patient stable at discharge. VSS. AVS given.

## 2022-12-06 LAB — CULTURE, URINE COMPREHENSIVE

## 2022-12-07 ENCOUNTER — Inpatient Hospital Stay: Payer: PPO

## 2022-12-08 ENCOUNTER — Other Ambulatory Visit: Payer: Self-pay

## 2022-12-08 MED ORDER — TRIMETHOPRIM 100 MG PO TABS: 100.0000 mg | ORAL_TABLET | Freq: Every day | ORAL | 11 refills | Status: DC

## 2022-12-08 NOTE — Telephone Encounter (Signed)
Informed pt's daughter of mediation being sent for UTI prevention per Sam. Pt's daughter voiced understanding

## 2022-12-29 ENCOUNTER — Inpatient Hospital Stay: Payer: PPO | Attending: Oncology

## 2022-12-29 DIAGNOSIS — Z803 Family history of malignant neoplasm of breast: Secondary | ICD-10-CM | POA: Diagnosis not present

## 2022-12-29 DIAGNOSIS — D509 Iron deficiency anemia, unspecified: Secondary | ICD-10-CM | POA: Diagnosis not present

## 2022-12-29 LAB — IRON AND TIBC
Iron: 73 ug/dL (ref 28–170)
Saturation Ratios: 27 % (ref 10.4–31.8)
TIBC: 273 ug/dL (ref 250–450)
UIBC: 200 ug/dL

## 2022-12-29 LAB — CBC WITH DIFFERENTIAL (CANCER CENTER ONLY)
Abs Immature Granulocytes: 0.03 10*3/uL (ref 0.00–0.07)
Basophils Absolute: 0.1 10*3/uL (ref 0.0–0.1)
Basophils Relative: 1 %
Eosinophils Absolute: 0.5 10*3/uL (ref 0.0–0.5)
Eosinophils Relative: 7 %
HCT: 37.2 % (ref 36.0–46.0)
Hemoglobin: 11.4 g/dL — ABNORMAL LOW (ref 12.0–15.0)
Immature Granulocytes: 0 %
Lymphocytes Relative: 26 %
Lymphs Abs: 1.8 10*3/uL (ref 0.7–4.0)
MCH: 24.1 pg — ABNORMAL LOW (ref 26.0–34.0)
MCHC: 30.6 g/dL (ref 30.0–36.0)
MCV: 78.5 fL — ABNORMAL LOW (ref 80.0–100.0)
Monocytes Absolute: 0.6 10*3/uL (ref 0.1–1.0)
Monocytes Relative: 9 %
Neutro Abs: 3.8 10*3/uL (ref 1.7–7.7)
Neutrophils Relative %: 57 %
Platelet Count: 309 10*3/uL (ref 150–400)
RBC: 4.74 MIL/uL (ref 3.87–5.11)
RDW: 25.6 % — ABNORMAL HIGH (ref 11.5–15.5)
WBC Count: 6.7 10*3/uL (ref 4.0–10.5)
nRBC: 0 % (ref 0.0–0.2)

## 2022-12-29 LAB — SAMPLE TO BLOOD BANK

## 2022-12-29 LAB — FERRITIN: Ferritin: 238 ng/mL (ref 11–307)

## 2023-01-01 ENCOUNTER — Inpatient Hospital Stay: Payer: PPO

## 2023-01-01 ENCOUNTER — Encounter: Payer: Self-pay | Admitting: Oncology

## 2023-01-01 ENCOUNTER — Inpatient Hospital Stay (HOSPITAL_BASED_OUTPATIENT_CLINIC_OR_DEPARTMENT_OTHER): Payer: PPO | Admitting: Oncology

## 2023-01-01 VITALS — BP 127/66 | HR 85 | Temp 98.6°F | Resp 16 | Ht 61.0 in | Wt 135.0 lb

## 2023-01-01 DIAGNOSIS — D509 Iron deficiency anemia, unspecified: Secondary | ICD-10-CM

## 2023-01-01 NOTE — Progress Notes (Signed)
[facility]  Telephone:(336) [phone] Fax:(336) [phone]  ID: Heidi Arias OB: September 08, 1931  MR#: [id number]  VVO#:[id number]  Patient Care Team: Myrene Buddy, NP as PCP - General (Internal Medicine)  CHIEF COMPLAINT: Iron deficiency anemia.  INTERVAL HISTORY: Patient returns to clinic today for repeat laboratory work, further evaluation, and consideration of IV Feraheme.  She continues to feel well and remains asymptomatic.  She does not complain of any weakness or fatigue. She has a good appetite and denies weight loss.  She has no chest pain, shortness of breath, cough, or hemoptysis.  She denies any nausea, vomiting, constipation, or diarrhea.  She denies any melena or hematochezia.  She has no urinary complaints.  Patient offers no specific complaints today.  REVIEW OF SYSTEMS:   Review of Systems  Constitutional: Negative.  Negative for fever, malaise/fatigue and weight loss.  Respiratory: Negative.  Negative for cough, hemoptysis and shortness of breath.   Cardiovascular: Negative.  Negative for chest pain and leg swelling.  Gastrointestinal: Negative.  Negative for abdominal pain, blood in stool and melena.  Genitourinary: Negative.  Negative for hematuria.  Musculoskeletal: Negative.  Negative for back pain.  Skin: Negative.  Negative for rash.  Neurological: Negative.  Negative for dizziness, focal weakness, weakness and headaches.  Psychiatric/Behavioral: Negative.  The patient is not nervous/anxious.     As per HPI. Otherwise, a complete review of systems is negative.  PAST MEDICAL HISTORY: Past Medical History:  Diagnosis Date   Anxiety    Cancer (HCC)    melanoma   Cancer (HCC)    squamous cell   Endometriosis    GERD (gastroesophageal reflux disease)    History of stomach ulcers    Kidney failure    Recurrent UTI (urinary tract infection)    Rheumatoid arthritis (HCC)     PAST SURGICAL HISTORY: Past Surgical History:   Procedure Laterality Date   ABDOMINAL HYSTERECTOMY     APPENDECTOMY     CATARACT EXTRACTION     CHOLECYSTECTOMY     COLON SURGERY     ESOPHAGOGASTRODUODENOSCOPY (EGD) WITH PROPOFOL N/A 03/15/2016   Procedure: ESOPHAGOGASTRODUODENOSCOPY (EGD) WITH PROPOFOL;  Surgeon: Scot Jun, MD;  Location: [facility] ENDOSCOPY;  Service: Endoscopy;  Laterality: N/A;   INNER EAR SURGERY     wears hearing aide   TONSILLECTOMY      FAMILY HISTORY: Family History  Problem Relation Age of Onset   CVA Father    Breast cancer Cousin        mat cousin   Prostate cancer Neg Hx    Kidney cancer Neg Hx    Kidney disease Neg Hx    Bladder Cancer Neg Hx     ADVANCED DIRECTIVES (Y/N):  N  HEALTH MAINTENANCE: Social History   Tobacco Use   Smoking status: Never   Smokeless tobacco: Never  Substance Use Topics   Alcohol use: No   Drug use: No     Colonoscopy:  PAP:  Bone density:  Lipid panel:  Allergies  Allergen Reactions   Ciprofloxacin Hcl Rash   Codeine Sulfate     Other reaction(s): Unknown   Doxycycline Hyclate Rash   Penicillin G Rash   Sulfamethoxazole-Trimethoprim Rash    Current Outpatient Medications  Medication Sig Dispense Refill   Ascorbic Acid (VITAMIN C PO) Take by mouth.     cephALEXin (KEFLEX) 250 MG capsule Take 1 capsule (250 mg total) by mouth daily. 90 capsule 1   Cholecalciferol (VITAMIN D3 PO) Take  by mouth.     conjugated estrogens (PREMARIN) vaginal cream Place 1 Applicatorful vaginally daily. Apply 0.5mg  (pea-sized amount)  just inside the vaginal introitus with a finger-tip on Monday, Wednesday and Friday nights. 30 g 12   hydrOXYzine (ATARAX/VISTARIL) 25 MG tablet Take 25 mg by mouth daily.     lidocaine (LIDODERM) 5 % 1 patch daily.     mupirocin ointment (BACTROBAN) 2 %      MYRBETRIQ 50 MG TB24 tablet TAKE (1) TABLET BY MOUTH EVERY DAY 90 tablet 3   omeprazole (PRILOSEC) 40 MG capsule Take 1 capsule by mouth 2 (two) times daily. Take 30 minutes  before breakfast and take 30 minutes before dinner.     Phenazopyridine HCl (AZO URINARY PAIN PO) Take by mouth as needed.     sertraline (ZOLOFT) 50 MG tablet      trimethoprim (TRIMPEX) 100 MG tablet Take 1 tablet (100 mg total) by mouth daily. 30 tablet 11   No current facility-administered medications for this visit.    OBJECTIVE: Vitals:   01/01/23 1327  BP: 127/66  Pulse: 85  Resp: 16  Temp: 98.6 F (37 C)  SpO2: 97%      Body mass index is 25.51 kg/m.    ECOG FS:0 - Asymptomatic  General: Well-developed, well-nourished, no acute distress.  Sitting in a wheelchair. Eyes: Pink conjunctiva, anicteric sclera. HEENT: Normocephalic, moist mucous membranes. Lungs: No audible wheezing or coughing. Heart: Regular rate and rhythm. Abdomen: Soft, nontender, no obvious distention. Musculoskeletal: No edema, cyanosis, or clubbing. Neuro: Alert, answering all questions appropriately. Cranial nerves grossly intact. Skin: No rashes or petechiae noted. Psych: Normal affect.  LAB RESULTS:  Lab Results  Component Value Date   NA 137 05/08/2021   K 3.5 05/08/2021   CL 106 05/08/2021   CO2 22 05/08/2021   GLUCOSE 154 (H) 05/08/2021   BUN 20 05/08/2021   CREATININE 0.84 05/08/2021   CALCIUM 8.7 (L) 05/08/2021   PROT 7.0 01/12/2016   ALBUMIN 3.6 01/12/2016   AST 17 01/12/2016   ALT 12 (L) 01/12/2016   ALKPHOS 68 01/12/2016   BILITOT 0.2 (L) 01/12/2016   GFRNONAA >60 05/08/2021   GFRAA >60 08/25/2016    Lab Results  Component Value Date   WBC 6.7 12/29/2022   NEUTROABS 3.8 12/29/2022   HGB 11.4 (L) 12/29/2022   HCT 37.2 12/29/2022   MCV 78.5 (L) 12/29/2022   PLT 309 12/29/2022   Lab Results  Component Value Date   IRON 73 12/29/2022   TIBC 273 12/29/2022   IRONPCTSAT 27 12/29/2022   Lab Results  Component Value Date   FERRITIN 238 12/29/2022     STUDIES: No results found.  ASSESSMENT: Iron deficiency anemia.  PLAN:    Iron deficiency anemia: Unclear  etiology.  Patient does not remember when she had her last colonoscopy.  Patient's hemoglobin is stable at 11.4 with normal iron stores.  She does not require blood or IV Feraheme today.  She last received treatment on December 05, 2022.  No intervention is needed.  Return to clinic in 2 months for laboratory work only and then in 4 months for laboratory work, further evaluation, and consideration of additional IV Feraheme if needed.    I spent a total of 20 minutes reviewing chart data, face-to-face evaluation with the patient, counseling and coordination of care as detailed above.    Patient expressed understanding and was in agreement with this plan. She also understands that She can call clinic  at any time with any questions, concerns, or complaints.     Jeralyn Ruths, MD   01/01/2023 1:42 PM

## 2023-02-01 DIAGNOSIS — R0689 Other abnormalities of breathing: Secondary | ICD-10-CM | POA: Diagnosis not present

## 2023-02-01 DIAGNOSIS — R54 Age-related physical debility: Secondary | ICD-10-CM | POA: Diagnosis not present

## 2023-02-01 DIAGNOSIS — E1122 Type 2 diabetes mellitus with diabetic chronic kidney disease: Secondary | ICD-10-CM | POA: Diagnosis not present

## 2023-02-01 DIAGNOSIS — J069 Acute upper respiratory infection, unspecified: Secondary | ICD-10-CM | POA: Diagnosis not present

## 2023-02-01 DIAGNOSIS — H109 Unspecified conjunctivitis: Secondary | ICD-10-CM | POA: Diagnosis not present

## 2023-02-01 DIAGNOSIS — N1831 Chronic kidney disease, stage 3a: Secondary | ICD-10-CM | POA: Diagnosis not present

## 2023-02-09 DIAGNOSIS — M79675 Pain in left toe(s): Secondary | ICD-10-CM | POA: Diagnosis not present

## 2023-02-09 DIAGNOSIS — B351 Tinea unguium: Secondary | ICD-10-CM | POA: Diagnosis not present

## 2023-02-09 DIAGNOSIS — M79674 Pain in right toe(s): Secondary | ICD-10-CM | POA: Diagnosis not present

## 2023-03-05 ENCOUNTER — Inpatient Hospital Stay: Payer: PPO | Attending: Oncology

## 2023-03-05 DIAGNOSIS — D509 Iron deficiency anemia, unspecified: Secondary | ICD-10-CM | POA: Diagnosis not present

## 2023-03-05 LAB — CBC WITH DIFFERENTIAL/PLATELET
Abs Immature Granulocytes: 0.03 10*3/uL (ref 0.00–0.07)
Basophils Absolute: 0.1 10*3/uL (ref 0.0–0.1)
Basophils Relative: 1 %
Eosinophils Absolute: 0.4 10*3/uL (ref 0.0–0.5)
Eosinophils Relative: 5 %
HCT: 34.2 % — ABNORMAL LOW (ref 36.0–46.0)
Hemoglobin: 10.9 g/dL — ABNORMAL LOW (ref 12.0–15.0)
Immature Granulocytes: 0 %
Lymphocytes Relative: 29 %
Lymphs Abs: 2.1 10*3/uL (ref 0.7–4.0)
MCH: 27.4 pg (ref 26.0–34.0)
MCHC: 31.9 g/dL (ref 30.0–36.0)
MCV: 85.9 fL (ref 80.0–100.0)
Monocytes Absolute: 0.6 10*3/uL (ref 0.1–1.0)
Monocytes Relative: 8 %
Neutro Abs: 3.9 10*3/uL (ref 1.7–7.7)
Neutrophils Relative %: 57 %
Platelets: 326 10*3/uL (ref 150–400)
RBC: 3.98 MIL/uL (ref 3.87–5.11)
RDW: 14.9 % (ref 11.5–15.5)
WBC: 7 10*3/uL (ref 4.0–10.5)
nRBC: 0 % (ref 0.0–0.2)

## 2023-03-05 LAB — IRON AND TIBC
Iron: 43 ug/dL (ref 28–170)
Saturation Ratios: 14 % (ref 10.4–31.8)
TIBC: 307 ug/dL (ref 250–450)
UIBC: 264 ug/dL

## 2023-03-05 LAB — FERRITIN: Ferritin: 27 ng/mL (ref 11–307)

## 2023-03-05 LAB — SAMPLE TO BLOOD BANK

## 2023-03-27 DIAGNOSIS — M25552 Pain in left hip: Secondary | ICD-10-CM | POA: Diagnosis not present

## 2023-03-27 DIAGNOSIS — M1612 Unilateral primary osteoarthritis, left hip: Secondary | ICD-10-CM | POA: Diagnosis not present

## 2023-04-02 DIAGNOSIS — H6062 Unspecified chronic otitis externa, left ear: Secondary | ICD-10-CM | POA: Diagnosis not present

## 2023-04-02 DIAGNOSIS — H7013 Chronic mastoiditis, bilateral: Secondary | ICD-10-CM | POA: Diagnosis not present

## 2023-04-04 DIAGNOSIS — M1612 Unilateral primary osteoarthritis, left hip: Secondary | ICD-10-CM | POA: Diagnosis not present

## 2023-04-06 DIAGNOSIS — M1612 Unilateral primary osteoarthritis, left hip: Secondary | ICD-10-CM | POA: Diagnosis not present

## 2023-04-06 DIAGNOSIS — E119 Type 2 diabetes mellitus without complications: Secondary | ICD-10-CM | POA: Diagnosis not present

## 2023-04-12 DIAGNOSIS — E1122 Type 2 diabetes mellitus with diabetic chronic kidney disease: Secondary | ICD-10-CM | POA: Diagnosis not present

## 2023-04-12 DIAGNOSIS — N1831 Chronic kidney disease, stage 3a: Secondary | ICD-10-CM | POA: Diagnosis not present

## 2023-04-12 DIAGNOSIS — Z23 Encounter for immunization: Secondary | ICD-10-CM | POA: Diagnosis not present

## 2023-04-12 DIAGNOSIS — R54 Age-related physical debility: Secondary | ICD-10-CM | POA: Diagnosis not present

## 2023-04-12 DIAGNOSIS — Z87440 Personal history of urinary (tract) infections: Secondary | ICD-10-CM | POA: Diagnosis not present

## 2023-04-12 DIAGNOSIS — N390 Urinary tract infection, site not specified: Secondary | ICD-10-CM | POA: Diagnosis not present

## 2023-05-01 DIAGNOSIS — M1612 Unilateral primary osteoarthritis, left hip: Secondary | ICD-10-CM | POA: Diagnosis not present

## 2023-05-01 DIAGNOSIS — M47816 Spondylosis without myelopathy or radiculopathy, lumbar region: Secondary | ICD-10-CM | POA: Diagnosis not present

## 2023-05-07 ENCOUNTER — Inpatient Hospital Stay: Payer: PPO | Attending: Oncology

## 2023-05-07 DIAGNOSIS — K219 Gastro-esophageal reflux disease without esophagitis: Secondary | ICD-10-CM | POA: Insufficient documentation

## 2023-05-07 DIAGNOSIS — Z79899 Other long term (current) drug therapy: Secondary | ICD-10-CM | POA: Diagnosis not present

## 2023-05-07 DIAGNOSIS — M069 Rheumatoid arthritis, unspecified: Secondary | ICD-10-CM | POA: Diagnosis not present

## 2023-05-07 DIAGNOSIS — F419 Anxiety disorder, unspecified: Secondary | ICD-10-CM | POA: Insufficient documentation

## 2023-05-07 DIAGNOSIS — Z8711 Personal history of peptic ulcer disease: Secondary | ICD-10-CM | POA: Insufficient documentation

## 2023-05-07 DIAGNOSIS — Z87442 Personal history of urinary calculi: Secondary | ICD-10-CM | POA: Insufficient documentation

## 2023-05-07 DIAGNOSIS — Z803 Family history of malignant neoplasm of breast: Secondary | ICD-10-CM | POA: Diagnosis not present

## 2023-05-07 DIAGNOSIS — D509 Iron deficiency anemia, unspecified: Secondary | ICD-10-CM | POA: Diagnosis not present

## 2023-05-07 DIAGNOSIS — Z85820 Personal history of malignant melanoma of skin: Secondary | ICD-10-CM | POA: Insufficient documentation

## 2023-05-07 LAB — SAMPLE TO BLOOD BANK

## 2023-05-07 LAB — IRON AND TIBC
Iron: 50 ug/dL (ref 28–170)
Saturation Ratios: 13 % (ref 10.4–31.8)
TIBC: 391 ug/dL (ref 250–450)
UIBC: 341 ug/dL

## 2023-05-07 LAB — CBC WITH DIFFERENTIAL/PLATELET
Abs Immature Granulocytes: 0.01 10*3/uL (ref 0.00–0.07)
Basophils Absolute: 0.1 10*3/uL (ref 0.0–0.1)
Basophils Relative: 1 %
Eosinophils Absolute: 0.3 10*3/uL (ref 0.0–0.5)
Eosinophils Relative: 4 %
HCT: 36.8 % (ref 36.0–46.0)
Hemoglobin: 11.7 g/dL — ABNORMAL LOW (ref 12.0–15.0)
Immature Granulocytes: 0 %
Lymphocytes Relative: 32 %
Lymphs Abs: 2.1 10*3/uL (ref 0.7–4.0)
MCH: 27.2 pg (ref 26.0–34.0)
MCHC: 31.8 g/dL (ref 30.0–36.0)
MCV: 85.6 fL (ref 80.0–100.0)
Monocytes Absolute: 0.5 10*3/uL (ref 0.1–1.0)
Monocytes Relative: 8 %
Neutro Abs: 3.5 10*3/uL (ref 1.7–7.7)
Neutrophils Relative %: 55 %
Platelets: 383 10*3/uL (ref 150–400)
RBC: 4.3 MIL/uL (ref 3.87–5.11)
RDW: 13.3 % (ref 11.5–15.5)
WBC: 6.5 10*3/uL (ref 4.0–10.5)
nRBC: 0 % (ref 0.0–0.2)

## 2023-05-07 LAB — FERRITIN: Ferritin: 14 ng/mL (ref 11–307)

## 2023-05-08 ENCOUNTER — Inpatient Hospital Stay: Payer: PPO | Admitting: Oncology

## 2023-05-08 ENCOUNTER — Inpatient Hospital Stay: Payer: PPO

## 2023-05-08 ENCOUNTER — Encounter: Payer: Self-pay | Admitting: Oncology

## 2023-05-08 VITALS — BP 131/55 | HR 77 | Temp 98.6°F | Resp 16 | Wt 135.0 lb

## 2023-05-08 DIAGNOSIS — D509 Iron deficiency anemia, unspecified: Secondary | ICD-10-CM | POA: Diagnosis not present

## 2023-05-08 NOTE — Progress Notes (Signed)
Pt and daughter in for follow up.  Reports no changes or concern, and is doing well.

## 2023-05-08 NOTE — Progress Notes (Signed)
[facility]  Telephone:(336) [phone] Fax:(336) [phone]  ID: Heidi Arias OB: 1931-06-23  MR#: [id number]  HQI#:[id number]  Patient Care Team: Myrene Buddy, NP as PCP - General (Internal Medicine)  CHIEF COMPLAINT: Iron deficiency anemia.  INTERVAL HISTORY: Patient returns to clinic today for repeat laboratory, further evaluation, and consideration of IV Feraheme.  She currently feels well and is asymptomatic.  She does not complain of any weakness or fatigue. She has a good appetite and denies weight loss.  She has no chest pain, shortness of breath, cough, or hemoptysis.  She denies any nausea, vomiting, constipation, or diarrhea.  She denies any melena or hematochezia.  She has no urinary complaints.  Patient offers no specific complaints today.  REVIEW OF SYSTEMS:   Review of Systems  Constitutional: Negative.  Negative for fever, malaise/fatigue and weight loss.  Respiratory: Negative.  Negative for cough, hemoptysis and shortness of breath.   Cardiovascular: Negative.  Negative for chest pain and leg swelling.  Gastrointestinal: Negative.  Negative for abdominal pain, blood in stool and melena.  Genitourinary: Negative.  Negative for hematuria.  Musculoskeletal: Negative.  Negative for back pain.  Skin: Negative.  Negative for rash.  Neurological: Negative.  Negative for dizziness, focal weakness, weakness and headaches.  Psychiatric/Behavioral: Negative.  The patient is not nervous/anxious.     As per HPI. Otherwise, a complete review of systems is negative.  PAST MEDICAL HISTORY: Past Medical History:  Diagnosis Date   Anxiety    Cancer (HCC)    melanoma   Cancer (HCC)    squamous cell   Endometriosis    GERD (gastroesophageal reflux disease)    History of stomach ulcers    Kidney failure    Recurrent UTI (urinary tract infection)    Rheumatoid arthritis (HCC)     PAST SURGICAL HISTORY: Past Surgical History:  Procedure  Laterality Date   ABDOMINAL HYSTERECTOMY     APPENDECTOMY     CATARACT EXTRACTION     CHOLECYSTECTOMY     COLON SURGERY     ESOPHAGOGASTRODUODENOSCOPY (EGD) WITH PROPOFOL N/A 03/15/2016   Procedure: ESOPHAGOGASTRODUODENOSCOPY (EGD) WITH PROPOFOL;  Surgeon: Scot Jun, MD;  Location: [facility] ENDOSCOPY;  Service: Endoscopy;  Laterality: N/A;   INNER EAR SURGERY     wears hearing aide   TONSILLECTOMY      FAMILY HISTORY: Family History  Problem Relation Age of Onset   CVA Father    Breast cancer Cousin        mat cousin   Prostate cancer Neg Hx    Kidney cancer Neg Hx    Kidney disease Neg Hx    Bladder Cancer Neg Hx     ADVANCED DIRECTIVES (Y/N):  N  HEALTH MAINTENANCE: Social History   Tobacco Use   Smoking status: Never   Smokeless tobacco: Never  Substance Use Topics   Alcohol use: No   Drug use: No     Colonoscopy:  PAP:  Bone density:  Lipid panel:  Allergies  Allergen Reactions   Ciprofloxacin Hcl Rash   Codeine Sulfate     Other reaction(s): Unknown   Doxycycline Hyclate Rash   Penicillin G Rash   Sulfamethoxazole-Trimethoprim Rash    Current Outpatient Medications  Medication Sig Dispense Refill   Ascorbic Acid (VITAMIN C PO) Take by mouth.     cephALEXin (KEFLEX) 250 MG capsule Take 1 capsule (250 mg total) by mouth daily. 90 capsule 1   Cholecalciferol (VITAMIN D3 PO) Take by mouth.  conjugated estrogens (PREMARIN) vaginal cream Place 1 Applicatorful vaginally daily. Apply 0.5mg  (pea-sized amount)  just inside the vaginal introitus with a finger-tip on Monday, Wednesday and Friday nights. 30 g 12   hydrOXYzine (ATARAX/VISTARIL) 25 MG tablet Take 25 mg by mouth daily.     lidocaine (LIDODERM) 5 % 1 patch daily.     mupirocin ointment (BACTROBAN) 2 %      MYRBETRIQ 50 MG TB24 tablet TAKE (1) TABLET BY MOUTH EVERY DAY 90 tablet 3   omeprazole (PRILOSEC) 40 MG capsule Take 1 capsule by mouth 2 (two) times daily. Take 30 minutes before breakfast  and take 30 minutes before dinner.     Phenazopyridine HCl (AZO URINARY PAIN PO) Take by mouth as needed.     sertraline (ZOLOFT) 50 MG tablet      trimethoprim (TRIMPEX) 100 MG tablet Take 1 tablet (100 mg total) by mouth daily. 30 tablet 11   No current facility-administered medications for this visit.    OBJECTIVE: Vitals:   05/08/23 1309  BP: (!) 131/55  Pulse: 77  Resp: 16  Temp: 98.6 F (37 C)  SpO2: 99%       Body mass index is 25.51 kg/m.    ECOG FS:0 - Asymptomatic  General: Well-developed, well-nourished, no acute distress.  Sitting in a wheelchair. Eyes: Pink conjunctiva, anicteric sclera. HEENT: Normocephalic, moist mucous membranes. Lungs: No audible wheezing or coughing. Heart: Regular rate and rhythm. Abdomen: Soft, nontender, no obvious distention. Musculoskeletal: No edema, cyanosis, or clubbing. Neuro: Alert, answering all questions appropriately. Cranial nerves grossly intact. Skin: No rashes or petechiae noted. Psych: Normal affect.  LAB RESULTS:  Lab Results  Component Value Date   NA 137 05/08/2021   K 3.5 05/08/2021   CL 106 05/08/2021   CO2 22 05/08/2021   GLUCOSE 154 (H) 05/08/2021   BUN 20 05/08/2021   CREATININE 0.84 05/08/2021   CALCIUM 8.7 (L) 05/08/2021   PROT 7.0 01/12/2016   ALBUMIN 3.6 01/12/2016   AST 17 01/12/2016   ALT 12 (L) 01/12/2016   ALKPHOS 68 01/12/2016   BILITOT 0.2 (L) 01/12/2016   GFRNONAA >60 05/08/2021   GFRAA >60 08/25/2016    Lab Results  Component Value Date   WBC 6.5 05/07/2023   NEUTROABS 3.5 05/07/2023   HGB 11.7 (L) 05/07/2023   HCT 36.8 05/07/2023   MCV 85.6 05/07/2023   PLT 383 05/07/2023   Lab Results  Component Value Date   IRON 50 05/07/2023   TIBC 391 05/07/2023   IRONPCTSAT 13 05/07/2023   Lab Results  Component Value Date   FERRITIN 14 05/07/2023     STUDIES: No results found.  ASSESSMENT: Iron deficiency anemia.  PLAN:    Iron deficiency anemia: Essentially resolved.   Patient does not remember when she had her last colonoscopy.  Patient's hemoglobin is mildly improved to 11.7 and her iron stores continue to be within normal limits.  She does not require additional IV Feraheme today. She last received treatment on December 05, 2022.  Return to clinic in 4 months with repeat laboratory work, further evaluation, and continuation of treatment if needed.  I spent a total of 20 minutes reviewing chart data, face-to-face evaluation with the patient, counseling and coordination of care as detailed above.   Patient expressed understanding and was in agreement with this plan. She also understands that She can call clinic at any time with any questions, concerns, or complaints.     Jeralyn Ruths, MD  05/08/2023 1:30 PM

## 2023-05-14 NOTE — Progress Notes (Unsigned)
05/15/2023 2:52 PM   Heidi Arias Mar 21, 1932 [id number]  Referring provider: Myrene Buddy, NP [address] [place],  [place] [address]  Urological history: 1. rUTI's -contributing factors of age, vaginal atrophy, diarrhea, incontinence and poor liquid intake  -RUS 2021 NED -RUS 2022 NED -documented urine cultures over the last year  April 12, 2023, Proteus mirabilis  December 01, 2022, Enterobacter cloacae complex   Aug 16, 2022, Enterobacter cloacae complex -Keflex 250 mg daily        2. High risk hematuria -non-smoker -CTU (2018) -no worrisome findings -cysto (2018) Mild trabeculation with a large wide mouth diverticulum involving the majority of the right lateral wall -urine cytology (2018) negative -no reports of gross heme   3. Mixed incontinence -contributing factors of age, vaginal atrophy, pelvic surgery and depression -Myrbetriq 50 mg daily    4. Vaginal atrophy -vaginal estrogen cream three nights weekly  HPI: Heidi Arias is a 89 y.o. female who presents today for yearly follow-up with her daughter, Jola Babinski.  Previous records reviewed.     She is having 1-7 daytime voids, she wears depends overnight and they are soaked in the morning.  She has stress and urge incontinence.  She leaks 1-2 times a day.  She wears 1 depends daily reinforced by 3-4 absorbent pads.  She does not limit fluid intake and she does engage in toilet mapping.  Her daughter feels that her urine is still infected because it is dark, has a strong odor and she is having suprapubic pain.    Patient denies any modifying or aggravating factors.  Patient denies any gross hematuria, dysuria or flank pain.  Patient denies any fevers, chills, nausea or vomiting.    UA yellow cloudy, specific gravity greater than 1.030, pH 5.5, 1+ leukocytes, greater than 30 WBCs, many bacteria.  PVR 85 mL   PMH: Past Medical History:  Diagnosis Date   Anxiety    Cancer  (HCC)    melanoma   Cancer (HCC)    squamous cell   Endometriosis    GERD (gastroesophageal reflux disease)    History of stomach ulcers    Kidney failure    Recurrent UTI (urinary tract infection)    Rheumatoid arthritis (HCC)     Surgical History: Past Surgical History:  Procedure Laterality Date   ABDOMINAL HYSTERECTOMY     APPENDECTOMY     CATARACT EXTRACTION     CHOLECYSTECTOMY     COLON SURGERY     ESOPHAGOGASTRODUODENOSCOPY (EGD) WITH PROPOFOL N/A 03/15/2016   Procedure: ESOPHAGOGASTRODUODENOSCOPY (EGD) WITH PROPOFOL;  Surgeon: Scot Jun, MD;  Location: [facility] END[facility]COPY;  Service: Endoscopy;  Laterality: N/A;   INNER EAR SURGERY     wears hearing aide   TONSILLECTOMY      Home Medications:  Allergies as of 05/15/2023       Reactions   Ciprofloxacin Hcl Rash   Codeine Sulfate    Other reaction(s): Unknown   Doxycycline Hyclate Rash   Penicillin G Rash   Sulfamethoxazole-trimethoprim Rash        Medication List        Accurate as of May 15, 2023  2:52 PM. If you have any questions, ask your nurse or doctor.          STOP taking these medications    cephALEXin 250 MG capsule Commonly known as: KEFLEX Stopped by: Karman Veney   trimethoprim 100 MG tablet Commonly known as: TRIMPEX Stopped by: Michiel Cowboy  TAKE these medications    AZO URINARY PAIN PO Take by mouth as needed.   hydrOXYzine 25 MG tablet Commonly known as: ATARAX Take 25 mg by mouth daily.   lidocaine 5 % Commonly known as: LIDODERM 1 patch daily.   methenamine 1 g tablet Commonly known as: Hiprex Take 1 tablet (1 g total) by mouth 2 (two) times daily with a meal. Started by: Carollee Herter Nemiah Kissner   mupirocin ointment 2 % Commonly known as: BACTROBAN   Myrbetriq 50 MG Tb24 tablet Generic drug: mirabegron ER TAKE (1) TABLET BY MOUTH EVERY DAY   omeprazole 40 MG capsule Commonly known as: PRILOSEC Take 1 capsule by mouth 2 (two) times daily. Take  30 minutes before breakfast and take 30 minutes before dinner.   Premarin vaginal cream Generic drug: conjugated estrogens Place 1 Applicatorful vaginally daily. Apply 0.5mg  (pea-sized amount)  just inside the vaginal introitus with a finger-tip on Monday, Wednesday and Friday nights.   sertraline 50 MG tablet Commonly known as: ZOLOFT   VITAMIN C PO Take by mouth.   VITAMIN D3 PO Take by mouth.        Allergies:  Allergies  Allergen Reactions   Ciprofloxacin Hcl Rash   Codeine Sulfate     Other reaction(s): Unknown   Doxycycline Hyclate Rash   Penicillin G Rash   Sulfamethoxazole-Trimethoprim Rash    Family History: Family History  Problem Relation Age of Onset   CVA Father    Breast cancer Cousin        mat cousin   Prostate cancer Neg Hx    Kidney cancer Neg Hx    Kidney disease Neg Hx    Bladder Cancer Neg Hx     Social History:  reports that she has never smoked. She has never used smokeless tobacco. She reports that she does not drink alcohol and does not use drugs.  ROS: Pertinent ROS in HPI  Physical Exam: BP 126/84   Pulse 87   Ht 5\' 1"  (1.549 m)   Wt 135 lb (61.2 kg)   BMI 25.51 kg/m   Constitutional:  Well nourished. Alert and oriented, No acute distress. HEENT: [place] AT, moist mucus membranes.  Trachea midline, no masses. Cardiovascular: No clubbing, cyanosis, or edema. Respiratory: Normal respiratory effort, no increased work of breathing. Neurologic: Grossly intact, no focal deficits, moving all 4 extremities. Psychiatric: Normal mood and affect.    Laboratory Data: CBC    Component Value Date/Time   WBC 6.5 05/07/2023 1332   RBC 4.30 05/07/2023 1332   HGB 11.7 (L) 05/07/2023 1332   HGB 11.4 (L) 12/29/2022 1048   HGB 8.4 (L) 06/17/2015 1634   HCT 36.8 05/07/2023 1332   HCT 27.8 (L) 06/17/2015 1634   PLT 383 05/07/2023 1332   PLT 309 12/29/2022 1048   PLT 514 (H) 06/17/2015 1634   MCV 85.6 05/07/2023 1332   MCV 69 (L) 06/17/2015  1634   MCH 27.2 05/07/2023 1332   MCHC 31.8 05/07/2023 1332   RDW 13.3 05/07/2023 1332   RDW 16.6 (H) 06/17/2015 1634   LYMPHSABS 2.1 05/07/2023 1332   MONOABS 0.5 05/07/2023 1332   EOSABS 0.3 05/07/2023 1332   BASOSABS 0.1 05/07/2023 1332       Pertinent Imaging: N/A  Assessment & Plan:    1. Recurrent UTI -Explained that her urinary tract infections are becoming increasingly resistant and I would be a good time to start the Hiprex 1 g twice daily -Explained how the Hiprex works  by sterilizing the urine -Cath UA suspicious for infection -Urine sent for culture, but we will wait to prescribe antibiotic until urine culture results are available  2. Vaginal atrophy -continue applying the vaginal estrogen cream three nights weekly  3. Mixed incontinence -continue the Myrbetriq 50 mg daily                                        Return for pending urine culture results .  These notes generated with voice recognition software. I apologize for typographical errors.  Cloretta Ned  [facility] [address]  [address] [place], [place] [address] [phone]

## 2023-05-15 ENCOUNTER — Encounter: Payer: Self-pay | Admitting: Urology

## 2023-05-15 ENCOUNTER — Ambulatory Visit (INDEPENDENT_AMBULATORY_CARE_PROVIDER_SITE_OTHER): Payer: Self-pay | Admitting: Urology

## 2023-05-15 VITALS — BP 126/84 | HR 87 | Ht 61.0 in | Wt 135.0 lb

## 2023-05-15 DIAGNOSIS — N390 Urinary tract infection, site not specified: Secondary | ICD-10-CM | POA: Diagnosis not present

## 2023-05-15 DIAGNOSIS — N3946 Mixed incontinence: Secondary | ICD-10-CM | POA: Diagnosis not present

## 2023-05-15 DIAGNOSIS — N952 Postmenopausal atrophic vaginitis: Secondary | ICD-10-CM | POA: Diagnosis not present

## 2023-05-15 DIAGNOSIS — Z87440 Personal history of urinary (tract) infections: Secondary | ICD-10-CM

## 2023-05-15 LAB — URINALYSIS, COMPLETE
Bilirubin, UA: NEGATIVE
Glucose, UA: NEGATIVE
Ketones, UA: NEGATIVE
Nitrite, UA: NEGATIVE
Protein,UA: NEGATIVE
RBC, UA: NEGATIVE
Specific Gravity, UA: >1.03 — ABNORMAL HIGH (ref 1.005–1.030)
Urobilinogen, Ur: 0.2 mg/dL (ref 0.2–1.0)
pH, UA: 5.5 (ref 5.0–7.5)

## 2023-05-15 LAB — BLADDER SCAN AMB NON-IMAGING

## 2023-05-15 LAB — MICROSCOPIC EXAMINATION: WBC, UA: >30 /[HPF] — AB (ref 0–5)

## 2023-05-15 MED ORDER — METHENAMINE HIPPURATE 1 G PO TABS: 1.0000 g | ORAL_TABLET | Freq: Two times a day (BID) | ORAL | 3 refills | Status: DC

## 2023-05-15 NOTE — Progress Notes (Signed)
In and Out Catheterization  Patient is present today for a I & O catheterization due to possible UTI. Patient was cleaned and prepped in a sterile fashion with betadine . A 14FR cath was inserted no complications were noted , 85ml of urine return was noted, urine was yellow in color. A clean urine sample was collected for UA. Bladder was drained  And catheter was removed with out difficulty.    Performed by: Benay Pike CMA

## 2023-05-19 LAB — CULTURE, URINE COMPREHENSIVE

## 2023-05-20 ENCOUNTER — Other Ambulatory Visit: Payer: Self-pay | Admitting: Urology

## 2023-05-20 DIAGNOSIS — N390 Urinary tract infection, site not specified: Secondary | ICD-10-CM

## 2023-05-20 MED ORDER — NITROFURANTOIN MONOHYD MACRO 100 MG PO CAPS
100.0000 mg | ORAL_CAPSULE | Freq: Two times a day (BID) | ORAL | 0 refills | Status: DC
Start: 2023-05-20 — End: 2023-08-21

## 2023-05-21 DIAGNOSIS — E119 Type 2 diabetes mellitus without complications: Secondary | ICD-10-CM | POA: Diagnosis not present

## 2023-05-21 DIAGNOSIS — B351 Tinea unguium: Secondary | ICD-10-CM | POA: Diagnosis not present

## 2023-05-21 DIAGNOSIS — M79675 Pain in left toe(s): Secondary | ICD-10-CM | POA: Diagnosis not present

## 2023-05-21 DIAGNOSIS — M79674 Pain in right toe(s): Secondary | ICD-10-CM | POA: Diagnosis not present

## 2023-06-29 DIAGNOSIS — K219 Gastro-esophageal reflux disease without esophagitis: Secondary | ICD-10-CM | POA: Diagnosis not present

## 2023-06-29 DIAGNOSIS — Z79899 Other long term (current) drug therapy: Secondary | ICD-10-CM | POA: Diagnosis not present

## 2023-06-29 DIAGNOSIS — Z0000 Encounter for general adult medical examination without abnormal findings: Secondary | ICD-10-CM | POA: Diagnosis not present

## 2023-06-29 DIAGNOSIS — G8929 Other chronic pain: Secondary | ICD-10-CM | POA: Diagnosis not present

## 2023-06-29 DIAGNOSIS — R54 Age-related physical debility: Secondary | ICD-10-CM | POA: Diagnosis not present

## 2023-06-29 DIAGNOSIS — M545 Low back pain, unspecified: Secondary | ICD-10-CM | POA: Diagnosis not present

## 2023-06-29 DIAGNOSIS — D509 Iron deficiency anemia, unspecified: Secondary | ICD-10-CM | POA: Diagnosis not present

## 2023-06-29 DIAGNOSIS — E1122 Type 2 diabetes mellitus with diabetic chronic kidney disease: Secondary | ICD-10-CM | POA: Diagnosis not present

## 2023-06-29 DIAGNOSIS — N1831 Chronic kidney disease, stage 3a: Secondary | ICD-10-CM | POA: Diagnosis not present

## 2023-06-29 DIAGNOSIS — R399 Unspecified symptoms and signs involving the genitourinary system: Secondary | ICD-10-CM | POA: Diagnosis not present

## 2023-07-11 DIAGNOSIS — H353131 Nonexudative age-related macular degeneration, bilateral, early dry stage: Secondary | ICD-10-CM | POA: Diagnosis not present

## 2023-07-11 DIAGNOSIS — Z961 Presence of intraocular lens: Secondary | ICD-10-CM | POA: Diagnosis not present

## 2023-07-11 DIAGNOSIS — H43813 Vitreous degeneration, bilateral: Secondary | ICD-10-CM | POA: Diagnosis not present

## 2023-07-16 DIAGNOSIS — M1612 Unilateral primary osteoarthritis, left hip: Secondary | ICD-10-CM | POA: Diagnosis not present

## 2023-07-31 DIAGNOSIS — M1612 Unilateral primary osteoarthritis, left hip: Secondary | ICD-10-CM | POA: Diagnosis not present

## 2023-07-31 DIAGNOSIS — M47816 Spondylosis without myelopathy or radiculopathy, lumbar region: Secondary | ICD-10-CM | POA: Diagnosis not present

## 2023-08-21 ENCOUNTER — Encounter: Payer: Self-pay | Admitting: Urology

## 2023-08-21 ENCOUNTER — Ambulatory Visit: Payer: PPO | Admitting: Urology

## 2023-08-21 VITALS — BP 130/80 | HR 74 | Ht 61.0 in | Wt 135.0 lb

## 2023-08-21 DIAGNOSIS — N3946 Mixed incontinence: Secondary | ICD-10-CM

## 2023-08-21 DIAGNOSIS — N390 Urinary tract infection, site not specified: Secondary | ICD-10-CM | POA: Diagnosis not present

## 2023-08-21 DIAGNOSIS — N952 Postmenopausal atrophic vaginitis: Secondary | ICD-10-CM | POA: Diagnosis not present

## 2023-08-21 LAB — MICROSCOPIC EXAMINATION: WBC, UA: >30 /HPF — AB (ref 0–5)

## 2023-08-21 LAB — URINALYSIS, COMPLETE
Bilirubin, UA: NEGATIVE
Glucose, UA: NEGATIVE
Ketones, UA: NEGATIVE
Nitrite, UA: NEGATIVE
Protein,UA: NEGATIVE
RBC, UA: NEGATIVE
Specific Gravity, UA: 1.02 (ref 1.005–1.030)
Urobilinogen, Ur: 0.2 mg/dL (ref 0.2–1.0)
pH, UA: 6 (ref 5.0–7.5)

## 2023-08-21 MED ORDER — CEPHALEXIN 250 MG PO CAPS: 250.0000 mg | ORAL_CAPSULE | Freq: Every day | ORAL | 0 refills | Status: DC

## 2023-08-21 MED ORDER — CEFUROXIME AXETIL 250 MG PO TABS: 250.0000 mg | ORAL_TABLET | Freq: Two times a day (BID) | ORAL | 0 refills | Status: AC

## 2023-08-21 NOTE — Progress Notes (Signed)
 In and Out Catheterization  Patient is present today for a I & O catheterization due to uti. Patient was cleaned and prepped in a sterile fashion with betadine . A 14FR cath was inserted no complications were noted , 90ml of urine return was noted, urine was yellow  in color. A clean urine sample was collected for yellow. Bladder was drained  And catheter was removed with out difficulty.    Performed by: Theodosia Fishman CMA

## 2023-08-21 NOTE — Progress Notes (Signed)
 08/21/2023 3:35 PM   Heidi Arias Jun 22, 1931 [id number]  Referring provider: Deliah Fells, NP [address] [place],  [place] [address]  Urological history: 1. rUTI's -contributing factors of age, vaginal atrophy, diarrhea, incontinence and poor liquid intake  -RUS 2021 NED -RUS 2022 NED -April 4th, 2025, mixed urogenital flora -February 18th, 2025, Enterobacter cloacae complex -April 12, 2023, Proteus mirabilis -December 01, 2022, Enterobacter cloacae complex  -Keflex  250 mg daily        2. High risk hematuria -non-smoker -CTU (2018) -no worrisome findings -cysto (2018) Mild trabeculation with a large wide mouth diverticulum involving the majority of the right lateral wall -urine cytology (2018) negative   3. Mixed incontinence -contributing factors of age, vaginal atrophy, pelvic surgery and depression -Myrbetriq  50 mg daily    4. Vaginal atrophy -vaginal estrogen cream three nights weekly  HPI: Heidi Arias is a 89 y.o. female who presents today for three months follow-up with her daughter, Heidi Arias.  Previous records reviewed.     Her daughter, Heidi Arias, gives the history.  She states for the last 2 days she has noticed her mother complaining of lower back pain, have an increase in urinary incontinence and urgency and a foul odor to the urine.  Patient denies any modifying or aggravating factors.  Patient denies any recent UTI's, gross hematuria, dysuria or suprapubic/flank pain.  Patient denies any fevers, chills, nausea or vomiting.    Today's UA is yellow cloudy, specific gravity 1.020, pH 6.0, 2+ leukocyte, greater than 30 WBCs, 0-2 RBCs, 0-10 epithelial cells, mucus threads pleasant and many bacteria.  She has been taking the Hiprex  1 g twice daily and also applying the Premarin  cream and is taking the Myrbetriq . PMH: Past Medical History:  Diagnosis Date   Anxiety    Cancer (HCC)    melanoma   Cancer (HCC)    squamous  cell   Endometriosis    GERD (gastroesophageal reflux disease)    History of stomach ulcers    Kidney failure    Recurrent UTI (urinary tract infection)    Rheumatoid arthritis (HCC)     Surgical History: Past Surgical History:  Procedure Laterality Date   ABDOMINAL HYSTERECTOMY     APPENDECTOMY     CATARACT EXTRACTION     CHOLECYSTECTOMY     COLON SURGERY     ESOPHAGOGASTRODUODENOSCOPY (EGD) WITH PROPOFOL  N/A 03/15/2016   Procedure: ESOPHAGOGASTRODUODENOSCOPY (EGD) WITH PROPOFOL ;  Surgeon: Heidi Click, MD;  Location: [facility] ENDOSCOPY;  Service: Endoscopy;  Laterality: N/A;   INNER EAR SURGERY     wears hearing aide   TONSILLECTOMY      Home Medications:  Allergies as of 08/21/2023       Reactions   Ciprofloxacin  Hcl Rash   Codeine Sulfate    Other reaction(s): Unknown   Doxycycline Hyclate Rash   Penicillin G Rash   Sulfamethoxazole-trimethoprim  Rash        Medication List        Accurate as of Aug 21, 2023  3:35 PM. If you have any questions, ask your nurse or doctor.          STOP taking these medications    methenamine  1 g tablet Commonly known as: Hiprex    nitrofurantoin  (macrocrystal-monohydrate) 100 MG capsule Commonly known as: MACROBID        TAKE these medications    AZO URINARY PAIN PO Take by mouth as needed.   cefUROXime  250 MG tablet Commonly known as: CEFTIN   Take 1 tablet (250 mg total) by mouth 2 (two) times daily with a meal for 7 days.   cephALEXin  250 MG capsule Commonly known as: KEFLEX  Take 1 capsule (250 mg total) by mouth daily.   hydrOXYzine 25 MG tablet Commonly known as: ATARAX Take 25 mg by mouth daily.   lidocaine  5 % Commonly known as: LIDODERM  1 patch daily.   mupirocin ointment 2 % Commonly known as: BACTROBAN   Myrbetriq  50 MG Tb24 tablet Generic drug: mirabegron  ER TAKE (1) TABLET BY MOUTH EVERY DAY   omeprazole 40 MG capsule Commonly known as: PRILOSEC Take 1 capsule by mouth 2 (two) times  daily. Take 30 minutes before breakfast and take 30 minutes before dinner.   Premarin  vaginal cream Generic drug: conjugated estrogens  Place 1 Applicatorful vaginally daily. Apply 0.5mg  (pea-sized amount)  just inside the vaginal introitus with a finger-tip on Monday, Wednesday and Friday nights.   sertraline  50 MG tablet Commonly known as: ZOLOFT    VITAMIN C PO Take by mouth.   VITAMIN D3 PO Take by mouth.        Allergies:  Allergies  Allergen Reactions   Ciprofloxacin  Hcl Rash   Codeine Sulfate     Other reaction(s): Unknown   Doxycycline Hyclate Rash   Penicillin G Rash   Sulfamethoxazole-Trimethoprim  Rash    Family History: Family History  Problem Relation Age of Onset   CVA Father    Breast cancer Cousin        mat cousin   Prostate cancer Neg Hx    Kidney cancer Neg Hx    Kidney disease Neg Hx    Bladder Cancer Neg Hx     Social History:  reports that she has never smoked. She has never used smokeless tobacco. She reports that she does not drink alcohol and does not use drugs.  ROS: Pertinent ROS in HPI  Physical Exam: BP 130/80   Pulse 74   Ht 5\' 1"  (1.549 m)   Wt 135 lb (61.2 kg)   BMI 25.51 kg/m   Constitutional:  Well nourished. Alert and oriented, No acute distress. HEENT:  AT, moist mucus membranes.  Trachea midline Cardiovascular: No clubbing, cyanosis, or edema. Respiratory: Normal respiratory effort, no increased work of breathing. Neurologic: Grossly intact, no focal deficits, moving all 4 extremities. Psychiatric: Normal mood and affect.    Laboratory Data: Urinalysis See HPI and EPIC I have reviewed the labs.  See HPI.      Pertinent Imaging: N/A  Assessment & Plan:    1. Recurrent UTI - Patient having breakthrough infections despite taking Hiprex  twice daily and applying vaginal estrogen cream - UA suspicious for infection today - Urine culture pending -Will start Ceftin  250 mg twice daily empirically until culture  results are available and will adjust if necessary, Heidi Arias will be with her other daughter next week, Heidi Arias, and we will contact her with results - After this current infection is treated appropriately, she will start Keflex  250 mg daily - She will stop the Hiprex   2. Vaginal atrophy -continue applying the vaginal estrogen cream three nights weekly  3. Mixed incontinence -continue the Myrbetriq  50 mg daily                                        Return for pending urine culture results .  These notes generated with voice recognition software. I apologize  for typographical errors.  Briant Camper  [facility] [address]  [address] [place], [place] [address] [phone]

## 2023-08-21 NOTE — Addendum Note (Signed)
 Addended by: Matilde Son A on: 08/21/2023 04:51 PM   Modules accepted: Orders

## 2023-08-23 ENCOUNTER — Ambulatory Visit: Payer: Self-pay | Admitting: Urology

## 2023-08-23 LAB — CULTURE, URINE COMPREHENSIVE

## 2023-09-03 DIAGNOSIS — M79674 Pain in right toe(s): Secondary | ICD-10-CM | POA: Diagnosis not present

## 2023-09-03 DIAGNOSIS — M79675 Pain in left toe(s): Secondary | ICD-10-CM | POA: Diagnosis not present

## 2023-09-03 DIAGNOSIS — B351 Tinea unguium: Secondary | ICD-10-CM | POA: Diagnosis not present

## 2023-09-03 DIAGNOSIS — E119 Type 2 diabetes mellitus without complications: Secondary | ICD-10-CM | POA: Diagnosis not present

## 2023-09-10 ENCOUNTER — Inpatient Hospital Stay: Payer: PPO | Attending: Oncology

## 2023-09-10 DIAGNOSIS — D509 Iron deficiency anemia, unspecified: Secondary | ICD-10-CM | POA: Insufficient documentation

## 2023-09-10 LAB — CBC WITH DIFFERENTIAL/PLATELET
Abs Immature Granulocytes: 0.03 10*3/uL (ref 0.00–0.07)
Basophils Absolute: 0.1 10*3/uL (ref 0.0–0.1)
Basophils Relative: 1 %
Eosinophils Absolute: 0.3 10*3/uL (ref 0.0–0.5)
Eosinophils Relative: 4 %
HCT: 29.6 % — ABNORMAL LOW (ref 36.0–46.0)
Hemoglobin: 8.9 g/dL — ABNORMAL LOW (ref 12.0–15.0)
Immature Granulocytes: 0 %
Lymphocytes Relative: 24 %
Lymphs Abs: 1.8 10*3/uL (ref 0.7–4.0)
MCH: 23.5 pg — ABNORMAL LOW (ref 26.0–34.0)
MCHC: 30.1 g/dL (ref 30.0–36.0)
MCV: 78.1 fL — ABNORMAL LOW (ref 80.0–100.0)
Monocytes Absolute: 0.6 10*3/uL (ref 0.1–1.0)
Monocytes Relative: 8 %
Neutro Abs: 4.6 10*3/uL (ref 1.7–7.7)
Neutrophils Relative %: 63 %
Platelets: 364 10*3/uL (ref 150–400)
RBC: 3.79 MIL/uL — ABNORMAL LOW (ref 3.87–5.11)
RDW: 15.5 % (ref 11.5–15.5)
WBC: 7.3 10*3/uL (ref 4.0–10.5)
nRBC: 0 % (ref 0.0–0.2)

## 2023-09-10 LAB — IRON AND TIBC
Iron: 17 ug/dL — ABNORMAL LOW (ref 28–170)
Saturation Ratios: 5 % — ABNORMAL LOW (ref 10.4–31.8)
TIBC: 378 ug/dL (ref 250–450)
UIBC: 361 ug/dL

## 2023-09-10 LAB — FERRITIN: Ferritin: 5 ng/mL — ABNORMAL LOW (ref 11–307)

## 2023-09-10 LAB — SAMPLE TO BLOOD BANK

## 2023-09-11 ENCOUNTER — Inpatient Hospital Stay: Payer: PPO | Admitting: Oncology

## 2023-09-11 ENCOUNTER — Encounter: Payer: Self-pay | Admitting: Oncology

## 2023-09-11 ENCOUNTER — Inpatient Hospital Stay: Payer: PPO

## 2023-09-11 VITALS — BP 108/59 | HR 77 | Temp 98.0°F | Resp 16 | Wt 135.0 lb

## 2023-09-11 VITALS — BP 140/75 | HR 72

## 2023-09-11 DIAGNOSIS — D509 Iron deficiency anemia, unspecified: Secondary | ICD-10-CM

## 2023-09-11 MED ORDER — SODIUM CHLORIDE 0.9 % IV SOLN
Freq: Once | INTRAVENOUS | Status: AC
  Filled 2023-09-11: qty 250

## 2023-09-11 MED ORDER — SODIUM CHLORIDE 0.9 % IV SOLN
510.0000 mg | Freq: Once | INTRAVENOUS | Status: AC
  Administered 2023-09-11: 510 mg via INTRAVENOUS
  Filled 2023-09-11: qty 510

## 2023-09-11 NOTE — Patient Instructions (Signed)

## 2023-09-11 NOTE — Progress Notes (Signed)
 [facility]  [phone] [phone]  ID: Heidi Arias OB: 1931/04/26  MR#: [id number]  LKG#:[id number]  Patient Care Team: Deliah Fells, NP as PCP - General (Internal Medicine)  CHIEF COMPLAINT: Iron  deficiency anemia.  INTERVAL HISTORY: Patient returns to clinic today for repeat laboratory work, further evaluation, and consideration of IV Feraheme.  Patient reports she feels well and is asymptomatic, but her daughter has noted she has slowed a little bit over the past several weeks.  She has a good appetite and denies weight loss.  She has no chest pain, shortness of breath, cough, or hemoptysis.  She denies any nausea, vomiting, constipation, or diarrhea.  She denies any melena or hematochezia.  She has no urinary complaints.  Patient offers no further specific complaints today.  REVIEW OF SYSTEMS:   Review of Systems  Constitutional: Negative.  Negative for fever, malaise/fatigue and weight loss.  Respiratory: Negative.  Negative for cough, hemoptysis and shortness of breath.   Cardiovascular: Negative.  Negative for chest pain and leg swelling.  Gastrointestinal: Negative.  Negative for abdominal pain, blood in stool and melena.  Genitourinary: Negative.  Negative for hematuria.  Musculoskeletal: Negative.  Negative for back pain.  Skin: Negative.  Negative for rash.  Neurological: Negative.  Negative for dizziness, focal weakness, weakness and headaches.  Psychiatric/Behavioral: Negative.  The patient is not nervous/anxious.     As per HPI. Otherwise, a complete review of systems is negative.  PAST MEDICAL HISTORY: Past Medical History:  Diagnosis Date   Anxiety    Cancer (HCC)    melanoma   Cancer (HCC)    squamous cell   Endometriosis    GERD (gastroesophageal reflux disease)    History of stomach ulcers    Kidney failure    Recurrent UTI (urinary tract infection)    Rheumatoid arthritis (HCC)     PAST  SURGICAL HISTORY: Past Surgical History:  Procedure Laterality Date   ABDOMINAL HYSTERECTOMY     APPENDECTOMY     CATARACT EXTRACTION     CHOLECYSTECTOMY     COLON SURGERY     ESOPHAGOGASTRODUODENOSCOPY (EGD) WITH PROPOFOL  N/A 03/15/2016   Procedure: ESOPHAGOGASTRODUODENOSCOPY (EGD) WITH PROPOFOL ;  Surgeon: Cassie Click, MD;  Location: [facility] ENDOSCOPY;  Service: Endoscopy;  Laterality: N/A;   INNER EAR SURGERY     wears hearing aide   TONSILLECTOMY      FAMILY HISTORY: Family History  Problem Relation Age of Onset   CVA Father    Breast cancer Cousin        mat cousin   Prostate cancer Neg Hx    Kidney cancer Neg Hx    Kidney disease Neg Hx    Bladder Cancer Neg Hx     ADVANCED DIRECTIVES (Y/N):  N  HEALTH MAINTENANCE: Social History   Tobacco Use   Smoking status: Never   Smokeless tobacco: Never  Substance Use Topics   Alcohol use: No   Drug use: No     Colonoscopy:  PAP:  Bone density:  Lipid panel:  Allergies  Allergen Reactions   Ciprofloxacin  Hcl Rash   Codeine Sulfate     Other reaction(s): Unknown   Doxycycline Hyclate Rash   Penicillin G Rash   Sulfamethoxazole-Trimethoprim  Rash    Current Outpatient Medications  Medication Sig Dispense Refill   Ascorbic Acid (VITAMIN C PO) Take by mouth.     cephALEXin  (KEFLEX ) 250 MG capsule Take 1 capsule (250 mg total) by mouth daily. 90 capsule  0   Cholecalciferol (VITAMIN D3 PO) Take by mouth.     conjugated estrogens  (PREMARIN ) vaginal cream Place 1 Applicatorful vaginally daily. Apply 0.5mg  (pea-sized amount)  just inside the vaginal introitus with a finger-tip on Monday, Wednesday and Friday nights. 30 g 12   hydrOXYzine (ATARAX/VISTARIL) 25 MG tablet Take 25 mg by mouth daily.     lidocaine  (LIDODERM ) 5 % 1 patch daily.     mupirocin ointment (BACTROBAN) 2 %      MYRBETRIQ  50 MG TB24 tablet TAKE (1) TABLET BY MOUTH EVERY DAY 90 tablet 3   omeprazole (PRILOSEC) 40 MG capsule Take 1 capsule by mouth  2 (two) times daily. Take 30 minutes before breakfast and take 30 minutes before dinner.     Phenazopyridine HCl (AZO URINARY PAIN PO) Take by mouth as needed.     sertraline  (ZOLOFT ) 50 MG tablet      No current facility-administered medications for this visit.    OBJECTIVE: Vitals:   09/11/23 1258  BP: (!) 108/59  Pulse: 77  Resp: 16  Temp: 98 F (36.7 C)  SpO2: 97%       Body mass index is 25.51 kg/m.    ECOG FS:0 - Asymptomatic  General: Well-developed, well-nourished, no acute distress.  Sitting in a wheelchair. Eyes: Pink conjunctiva, anicteric sclera. HEENT: Normocephalic, moist mucous membranes. Lungs: No audible wheezing or coughing. Heart: Regular rate and rhythm. Abdomen: Soft, nontender, no obvious distention. Musculoskeletal: No edema, cyanosis, or clubbing. Neuro: Alert, answering all questions appropriately. Cranial nerves grossly intact. Skin: No rashes or petechiae noted. Psych: Normal affect.  LAB RESULTS:  Lab Results  Component Value Date   NA 137 05/08/2021   K 3.5 05/08/2021   CL 106 05/08/2021   CO2 22 05/08/2021   GLUCOSE 154 (H) 05/08/2021   BUN 20 05/08/2021   CREATININE 0.84 05/08/2021   CALCIUM  8.7 (L) 05/08/2021   PROT 7.0 01/12/2016   ALBUMIN 3.6 01/12/2016   AST 17 01/12/2016   ALT 12 (L) 01/12/2016   ALKPHOS 68 01/12/2016   BILITOT 0.2 (L) 01/12/2016   GFRNONAA >60 05/08/2021   GFRAA >60 08/25/2016    Lab Results  Component Value Date   WBC 7.3 09/10/2023   NEUTROABS 4.6 09/10/2023   HGB 8.9 (L) 09/10/2023   HCT 29.6 (L) 09/10/2023   MCV 78.1 (L) 09/10/2023   PLT 364 09/10/2023   Lab Results  Component Value Date   IRON  17 (L) 09/10/2023   TIBC 378 09/10/2023   IRONPCTSAT 5 (L) 09/10/2023   Lab Results  Component Value Date   FERRITIN 5 (L) 09/10/2023     STUDIES: No results found.  ASSESSMENT: Iron  deficiency anemia.  PLAN:    Iron  deficiency anemia: Patient's hemoglobin and iron  stores have  significantly reduced and she is mildly symptomatic.  Proceed with 510 mg IV Feraheme today.  Return to clinic in 1 week for second infusion.  Patient will then return to clinic in 3 months with repeat laboratory work, further evaluation, and consideration of additional treatment.    I spent a total of 30 minutes reviewing chart data, face-to-face evaluation with the patient, counseling and coordination of care as detailed above.   Patient expressed understanding and was in agreement with this plan. She also understands that She can call clinic at any time with any questions, concerns, or complaints.     Shellie Dials, MD   09/11/2023 1:23 PM

## 2023-09-13 ENCOUNTER — Telehealth: Payer: Self-pay | Admitting: Urology

## 2023-09-14 ENCOUNTER — Ambulatory Visit: Admitting: Physician Assistant

## 2023-09-14 DIAGNOSIS — N390 Urinary tract infection, site not specified: Secondary | ICD-10-CM

## 2023-09-14 LAB — URINALYSIS, COMPLETE
Bilirubin, UA: NEGATIVE
Glucose, UA: NEGATIVE
Nitrite, UA: POSITIVE — AB
RBC, UA: NEGATIVE
Specific Gravity, UA: 1.03 (ref 1.005–1.030)
Urobilinogen, Ur: 0.2 mg/dL (ref 0.2–1.0)
pH, UA: 6 (ref 5.0–7.5)

## 2023-09-14 LAB — MICROSCOPIC EXAMINATION: WBC, UA: >30 /HPF — AB (ref 0–5)

## 2023-09-14 MED ORDER — NITROFURANTOIN MONOHYD MACRO 100 MG PO CAPS: 100.0000 mg | ORAL_CAPSULE | Freq: Two times a day (BID) | ORAL | 0 refills | Status: AC

## 2023-09-14 NOTE — Progress Notes (Unsigned)
 09/14/2023 3:18 PM   Heidi Arias 1932/02/19 [id number]  CC: Chief Complaint  Patient presents with   Recurrent UTI   HPI: Heidi Arias is a 89 y.o. female with PMH recurrent UTI on Hiprex , high risk hematuria with benign workup in 2018, GSM on topical vaginal estrogen cream, and OAB wet with mixed incontinence on Myrbetriq  50 mg who presents today for evaluation of possible UTI.  She is accompanied today by her daughter, [name], who contributes to HPI.   Today she reports about 3 days of left low back pain and malodorous urine as well as increased confusion and nausea after she eats.  She denies fever or vomiting.  She has a remote history of kidney stones.  In-office cath UA today with trace protein, nitrates, trace ketones, and 1+ leukocytes; urine microscopy with >30 WBC/hpf, calcium  oxalate crystals, and many bacteria.Aaron Aas  PMH: Past Medical History:  Diagnosis Date   Anxiety    Cancer (HCC)    melanoma   Cancer (HCC)    squamous cell   Endometriosis    GERD (gastroesophageal reflux disease)    History of stomach ulcers    Kidney failure    Recurrent UTI (urinary tract infection)    Rheumatoid arthritis (HCC)     Surgical History: Past Surgical History:  Procedure Laterality Date   ABDOMINAL HYSTERECTOMY     APPENDECTOMY     CATARACT EXTRACTION     CHOLECYSTECTOMY     COLON SURGERY     ESOPHAGOGASTRODUODENOSCOPY (EGD) WITH PROPOFOL  N/A 03/15/2016   Procedure: ESOPHAGOGASTRODUODENOSCOPY (EGD) WITH PROPOFOL ;  Surgeon: Cassie Click, MD;  Location: [facility];  Service: Endoscopy;  Laterality: N/A;   INNER EAR SURGERY     wears hearing aide   TONSILLECTOMY      Home Medications:  Allergies as of 09/14/2023       Reactions   Ciprofloxacin  Hcl Rash   Codeine Sulfate    Other reaction(s): Unknown   Doxycycline Hyclate Rash   Penicillin G Rash   Sulfamethoxazole-trimethoprim  Rash        Medication List        Accurate as of September 14, 2023  3:18 PM. If you have any questions, ask your nurse or doctor.          AZO URINARY PAIN PO Take by mouth as needed.   cephALEXin  250 MG capsule Commonly known as: KEFLEX  Take 1 capsule (250 mg total) by mouth daily.   hydrOXYzine 25 MG tablet Commonly known as: ATARAX Take 25 mg by mouth daily.   lidocaine  5 % Commonly known as: LIDODERM  1 patch daily.   mupirocin ointment 2 % Commonly known as: BACTROBAN   Myrbetriq  50 MG Tb24 tablet Generic drug: mirabegron  ER TAKE (1) TABLET BY MOUTH EVERY DAY   omeprazole 40 MG capsule Commonly known as: PRILOSEC Take 1 capsule by mouth 2 (two) times daily. Take 30 minutes before breakfast and take 30 minutes before dinner.   Premarin  vaginal cream Generic drug: conjugated estrogens  Place 1 Applicatorful vaginally daily. Apply 0.5mg  (pea-sized amount)  just inside the vaginal introitus with a finger-tip on Monday, Wednesday and Friday nights.   sertraline  50 MG tablet Commonly known as: ZOLOFT    VITAMIN C PO Take by mouth.   VITAMIN D3 PO Take by mouth.        Allergies:  Allergies  Allergen Reactions   Ciprofloxacin  Hcl Rash   Codeine Sulfate     Other reaction(s): Unknown  Doxycycline Hyclate Rash   Penicillin G Rash   Sulfamethoxazole-Trimethoprim  Rash    Family History: Family History  Problem Relation Age of Onset   CVA Father    Breast cancer Cousin        mat cousin   Prostate cancer Neg Hx    Kidney cancer Neg Hx    Kidney disease Neg Hx    Bladder Cancer Neg Hx     Social History:   reports that she has never smoked. She has never used smokeless tobacco. She reports that she does not drink alcohol and does not use drugs.  Physical Exam: There were no vitals taken for this visit.  Constitutional:  Alert and oriented, no acute distress, nontoxic appearing HEENT: [place], AT Cardiovascular: No clubbing, cyanosis, or edema Respiratory: Normal respiratory effort, no increased work of  breathing Skin: No rashes, bruises or suspicious lesions Neurologic: Grossly intact, no focal deficits, moving all 4 extremities Psychiatric: Normal mood and affect  Laboratory Data: Results for orders placed or performed in visit on 09/10/23  Iron  and TIBC([facility])   Collection Time: 09/10/23  2:00 PM  Result Value Ref Range   Iron  17 (L) 28 - 170 ug/dL   TIBC 161 096 - 045 ug/dL   Saturation Ratios 5 (L) 10.4 - 31.8 %   UIBC 361 ug/dL  Ferritin   Collection Time: 09/10/23  2:00 PM  Result Value Ref Range   Ferritin 5 (L) 11 - 307 ng/mL  CBC with Differential/Platelet   Collection Time: 09/10/23  2:00 PM  Result Value Ref Range   WBC 7.3 4.0 - 10.5 K/uL   RBC 3.79 (L) 3.87 - 5.11 MIL/uL   Hemoglobin 8.9 (L) 12.0 - 15.0 g/dL   HCT 40.9 (L) 81.1 - 91.4 %   MCV 78.1 (L) 80.0 - 100.0 fL   MCH 23.5 (L) 26.0 - 34.0 pg   MCHC 30.1 30.0 - 36.0 g/dL   RDW 78.2 95.6 - 21.3 %   Platelets 364 150 - 400 K/uL   nRBC 0.0 0.0 - 0.2 %   Neutrophils Relative % 63 %   Neutro Abs 4.6 1.7 - 7.7 K/uL   Lymphocytes Relative 24 %   Lymphs Abs 1.8 0.7 - 4.0 K/uL   Monocytes Relative 8 %   Monocytes Absolute 0.6 0.1 - 1.0 K/uL   Eosinophils Relative 4 %   Eosinophils Absolute 0.3 0.0 - 0.5 K/uL   Basophils Relative 1 %   Basophils Absolute 0.1 0.0 - 0.1 K/uL   Immature Granulocytes 0 %   Abs Immature Granulocytes 0.03 0.00 - 0.07 K/uL  Hold Tube- Blood Bank   Collection Time: 09/10/23  2:00 PM  Result Value Ref Range   Blood Bank Specimen SAMPLE AVAILABLE FOR TESTING    Sample Expiration      09/13/2023,2359 Performed at [facility], [address], [place], [place] [address]    Assessment & Plan:   1. Recurrent UTI (Primary) Cath UA is grossly positive, will start empiric Macrobid  and send for culture.  Will obtain renal ultrasound given her remote history of stones to make sure she does not have a nidus or evidence of urinary obstruction. - Urinalysis, Complete -  CULTURE, URINE COMPREHENSIVE - US  RENAL; Future - nitrofurantoin , macrocrystal-monohydrate, (MACROBID ) 100 MG capsule; Take 1 capsule (100 mg total) by mouth 2 (two) times daily for 5 days.  Dispense: 10 capsule; Refill: 0   Return for Will call with results.  Shannara Winbush, PA-C  [facility] [address] [place], [place] [address] [phone]

## 2023-09-14 NOTE — Telephone Encounter (Signed)
 Heidi Arias

## 2023-09-15 ENCOUNTER — Other Ambulatory Visit: Payer: Self-pay | Admitting: Urology

## 2023-09-19 ENCOUNTER — Inpatient Hospital Stay

## 2023-09-19 VITALS — BP 133/65 | HR 79 | Temp 98.7°F | Resp 18

## 2023-09-19 DIAGNOSIS — D509 Iron deficiency anemia, unspecified: Secondary | ICD-10-CM | POA: Diagnosis not present

## 2023-09-19 MED ORDER — SODIUM CHLORIDE 0.9 % IV SOLN
510.0000 mg | Freq: Once | INTRAVENOUS | Status: AC
  Administered 2023-09-19: 510 mg via INTRAVENOUS
  Filled 2023-09-19: qty 510

## 2023-09-19 MED ORDER — SODIUM CHLORIDE 0.9 % IV SOLN
INTRAVENOUS | Status: DC
  Filled 2023-09-19 (×2): qty 250

## 2023-09-20 ENCOUNTER — Ambulatory Visit: Payer: Self-pay | Admitting: Physician Assistant

## 2023-09-20 LAB — CULTURE, URINE COMPREHENSIVE

## 2023-09-25 ENCOUNTER — Other Ambulatory Visit

## 2023-09-25 DIAGNOSIS — Z872 Personal history of diseases of the skin and subcutaneous tissue: Secondary | ICD-10-CM | POA: Diagnosis not present

## 2023-09-25 DIAGNOSIS — Z859 Personal history of malignant neoplasm, unspecified: Secondary | ICD-10-CM | POA: Diagnosis not present

## 2023-09-25 DIAGNOSIS — L570 Actinic keratosis: Secondary | ICD-10-CM | POA: Diagnosis not present

## 2023-09-25 DIAGNOSIS — Z85828 Personal history of other malignant neoplasm of skin: Secondary | ICD-10-CM | POA: Diagnosis not present

## 2023-09-25 DIAGNOSIS — L578 Other skin changes due to chronic exposure to nonionizing radiation: Secondary | ICD-10-CM | POA: Diagnosis not present

## 2023-09-25 DIAGNOSIS — L853 Xerosis cutis: Secondary | ICD-10-CM | POA: Diagnosis not present

## 2023-09-26 ENCOUNTER — Ambulatory Visit
Admission: RE | Admit: 2023-09-26 | Discharge: 2023-09-26 | Disposition: A | Discharge status: Home / Self Care | Source: Ambulatory Visit | Attending: Physician Assistant | Admitting: Physician Assistant

## 2023-09-26 DIAGNOSIS — N390 Urinary tract infection, site not specified: Secondary | ICD-10-CM | POA: Insufficient documentation

## 2023-09-26 DIAGNOSIS — N200 Calculus of kidney: Secondary | ICD-10-CM | POA: Diagnosis not present

## 2023-10-16 DIAGNOSIS — M1612 Unilateral primary osteoarthritis, left hip: Secondary | ICD-10-CM | POA: Diagnosis not present

## 2023-10-30 DIAGNOSIS — M47816 Spondylosis without myelopathy or radiculopathy, lumbar region: Secondary | ICD-10-CM | POA: Diagnosis not present

## 2023-10-30 DIAGNOSIS — M1612 Unilateral primary osteoarthritis, left hip: Secondary | ICD-10-CM | POA: Diagnosis not present

## 2023-11-16 ENCOUNTER — Other Ambulatory Visit: Payer: Self-pay | Admitting: Urology

## 2023-11-16 DIAGNOSIS — N390 Urinary tract infection, site not specified: Secondary | ICD-10-CM

## 2023-12-04 DIAGNOSIS — G309 Alzheimer's disease, unspecified: Secondary | ICD-10-CM | POA: Diagnosis not present

## 2023-12-04 DIAGNOSIS — H7013 Chronic mastoiditis, bilateral: Secondary | ICD-10-CM | POA: Diagnosis not present

## 2023-12-10 ENCOUNTER — Other Ambulatory Visit

## 2023-12-11 ENCOUNTER — Ambulatory Visit: Admitting: Oncology

## 2023-12-11 ENCOUNTER — Ambulatory Visit

## 2023-12-11 DIAGNOSIS — G8929 Other chronic pain: Secondary | ICD-10-CM | POA: Diagnosis not present

## 2023-12-11 DIAGNOSIS — K219 Gastro-esophageal reflux disease without esophagitis: Secondary | ICD-10-CM | POA: Diagnosis not present

## 2023-12-11 DIAGNOSIS — Z79899 Other long term (current) drug therapy: Secondary | ICD-10-CM | POA: Diagnosis not present

## 2023-12-11 DIAGNOSIS — Z1331 Encounter for screening for depression: Secondary | ICD-10-CM | POA: Diagnosis not present

## 2023-12-11 DIAGNOSIS — E1122 Type 2 diabetes mellitus with diabetic chronic kidney disease: Secondary | ICD-10-CM | POA: Diagnosis not present

## 2023-12-11 DIAGNOSIS — M545 Low back pain, unspecified: Secondary | ICD-10-CM | POA: Diagnosis not present

## 2023-12-11 DIAGNOSIS — N1831 Chronic kidney disease, stage 3a: Secondary | ICD-10-CM | POA: Diagnosis not present

## 2023-12-11 DIAGNOSIS — F419 Anxiety disorder, unspecified: Secondary | ICD-10-CM | POA: Diagnosis not present

## 2023-12-11 DIAGNOSIS — Z23 Encounter for immunization: Secondary | ICD-10-CM | POA: Diagnosis not present

## 2023-12-11 DIAGNOSIS — R54 Age-related physical debility: Secondary | ICD-10-CM | POA: Diagnosis not present

## 2023-12-11 DIAGNOSIS — D509 Iron deficiency anemia, unspecified: Secondary | ICD-10-CM | POA: Diagnosis not present

## 2023-12-12 DIAGNOSIS — M79674 Pain in right toe(s): Secondary | ICD-10-CM | POA: Diagnosis not present

## 2023-12-12 DIAGNOSIS — M79675 Pain in left toe(s): Secondary | ICD-10-CM | POA: Diagnosis not present

## 2023-12-12 DIAGNOSIS — B351 Tinea unguium: Secondary | ICD-10-CM | POA: Diagnosis not present

## 2023-12-14 ENCOUNTER — Other Ambulatory Visit: Payer: Self-pay | Admitting: *Deleted

## 2023-12-14 DIAGNOSIS — D509 Iron deficiency anemia, unspecified: Secondary | ICD-10-CM

## 2023-12-17 ENCOUNTER — Inpatient Hospital Stay: Attending: Oncology

## 2023-12-17 DIAGNOSIS — Z803 Family history of malignant neoplasm of breast: Secondary | ICD-10-CM | POA: Diagnosis not present

## 2023-12-17 DIAGNOSIS — D509 Iron deficiency anemia, unspecified: Secondary | ICD-10-CM | POA: Diagnosis not present

## 2023-12-17 LAB — CBC WITH DIFFERENTIAL/PLATELET
Abs Immature Granulocytes: 0.03 K/uL (ref 0.00–0.07)
Basophils Absolute: 0.1 K/uL (ref 0.0–0.1)
Basophils Relative: 1 %
Eosinophils Absolute: 0.3 K/uL (ref 0.0–0.5)
Eosinophils Relative: 4 %
HCT: 34.8 % — ABNORMAL LOW (ref 36.0–46.0)
Hemoglobin: 11.3 g/dL — ABNORMAL LOW (ref 12.0–15.0)
Immature Granulocytes: 0 %
Lymphocytes Relative: 22 %
Lymphs Abs: 1.7 K/uL (ref 0.7–4.0)
MCH: 28 pg (ref 26.0–34.0)
MCHC: 32.5 g/dL (ref 30.0–36.0)
MCV: 86.1 fL (ref 80.0–100.0)
Monocytes Absolute: 0.5 K/uL (ref 0.1–1.0)
Monocytes Relative: 7 %
Neutro Abs: 5.1 K/uL (ref 1.7–7.7)
Neutrophils Relative %: 66 %
Platelets: 305 K/uL (ref 150–400)
RBC: 4.04 MIL/uL (ref 3.87–5.11)
RDW: 14.5 % (ref 11.5–15.5)
WBC: 7.7 K/uL (ref 4.0–10.5)
nRBC: 0 % (ref 0.0–0.2)

## 2023-12-17 LAB — FERRITIN: Ferritin: 138 ng/mL (ref 11–307)

## 2023-12-17 LAB — IRON AND TIBC
Iron: 30 ug/dL (ref 28–170)
Saturation Ratios: 13 % (ref 10.4–31.8)
TIBC: 230 ug/dL — ABNORMAL LOW (ref 250–450)
UIBC: 200 ug/dL

## 2023-12-18 ENCOUNTER — Inpatient Hospital Stay (HOSPITAL_BASED_OUTPATIENT_CLINIC_OR_DEPARTMENT_OTHER): Admitting: Oncology

## 2023-12-18 ENCOUNTER — Encounter: Payer: Self-pay | Admitting: Oncology

## 2023-12-18 ENCOUNTER — Inpatient Hospital Stay

## 2023-12-18 VITALS — BP 117/85 | HR 87 | Temp 97.6°F | Resp 16 | Wt 130.0 lb

## 2023-12-18 DIAGNOSIS — D509 Iron deficiency anemia, unspecified: Secondary | ICD-10-CM

## 2023-12-18 NOTE — Progress Notes (Signed)
 [facility]  [phone] [phone]  ID: Heidi Arias OB: 1932-01-17  MR#: [id number]  RDW#:[id number]  Patient Care Team: Don Lauraine Collar, NP as PCP - General (Internal Medicine) Jacobo Evalene PARAS, MD as Consulting Physician (Hematology and Oncology)  CHIEF COMPLAINT: Iron  deficiency anemia.  INTERVAL HISTORY: Patient returns to clinic today for repeat laboratory work, further evaluation, and consideration of additional IV Feraheme.  She currently feels well and is at her baseline.  She does not complain of any weakness or fatigue.  She has no neurologic complaints.  She has a good appetite and denies weight loss.  She has no chest pain, shortness of breath, cough, or hemoptysis.  She denies any nausea, vomiting, constipation, or diarrhea.  She denies any melena or hematochezia.  She has no urinary complaints.  Patient offers no further specific complaints today.  REVIEW OF SYSTEMS:   Review of Systems  Constitutional: Negative.  Negative for fever, malaise/fatigue and weight loss.  Respiratory: Negative.  Negative for cough, hemoptysis and shortness of breath.   Cardiovascular: Negative.  Negative for chest pain and leg swelling.  Gastrointestinal: Negative.  Negative for abdominal pain, blood in stool and melena.  Genitourinary: Negative.  Negative for hematuria.  Musculoskeletal: Negative.  Negative for back pain.  Skin: Negative.  Negative for rash.  Neurological: Negative.  Negative for dizziness, focal weakness, weakness and headaches.  Psychiatric/Behavioral: Negative.  The patient is not nervous/anxious.     As per HPI. Otherwise, a complete review of systems is negative.  PAST MEDICAL HISTORY: Past Medical History:  Diagnosis Date   Anxiety    Cancer (HCC)    melanoma   Cancer (HCC)    squamous cell   Endometriosis    GERD (gastroesophageal reflux disease)    History of stomach ulcers    Kidney failure     Recurrent UTI (urinary tract infection)    Rheumatoid arthritis (HCC)     PAST SURGICAL HISTORY: Past Surgical History:  Procedure Laterality Date   ABDOMINAL HYSTERECTOMY     APPENDECTOMY     CATARACT EXTRACTION     CHOLECYSTECTOMY     COLON SURGERY     ESOPHAGOGASTRODUODENOSCOPY (EGD) WITH PROPOFOL  N/A 03/15/2016   Procedure: ESOPHAGOGASTRODUODENOSCOPY (EGD) WITH PROPOFOL ;  Surgeon: Lamar ONEIDA Holmes, MD;  Location: [facility] ENDOSCOPY;  Service: Endoscopy;  Laterality: N/A;   INNER EAR SURGERY     wears hearing aide   TONSILLECTOMY      FAMILY HISTORY: Family History  Problem Relation Age of Onset   CVA Father    Breast cancer Cousin        mat cousin   Prostate cancer Neg Hx    Kidney cancer Neg Hx    Kidney disease Neg Hx    Bladder Cancer Neg Hx     ADVANCED DIRECTIVES (Y/N):  N  HEALTH MAINTENANCE: Social History   Tobacco Use   Smoking status: Never   Smokeless tobacco: Never  Substance Use Topics   Alcohol use: No   Drug use: No     Colonoscopy:  PAP:  Bone density:  Lipid panel:  Allergies  Allergen Reactions   Ciprofloxacin  Hcl Rash   Codeine Sulfate     Other reaction(s): Unknown   Doxycycline Hyclate Rash   Penicillin G Rash   Sulfamethoxazole-Trimethoprim  Rash    Current Outpatient Medications  Medication Sig Dispense Refill   Ascorbic Acid (VITAMIN C PO) Take by mouth.     cephALEXin  (KEFLEX ) 250 MG  capsule TAKE 1 CAPSULE BY MOUTH DAILY. 90 capsule 0   Cholecalciferol (VITAMIN D3 PO) Take by mouth.     conjugated estrogens  (PREMARIN ) vaginal cream Place 1 Applicatorful vaginally daily. Apply 0.5mg  (pea-sized amount)  just inside the vaginal introitus with a finger-tip on Monday, Wednesday and Friday nights. 30 g 12   hydrOXYzine (ATARAX/VISTARIL) 25 MG tablet Take 25 mg by mouth daily.     lidocaine  (LIDODERM ) 5 % 1 patch daily.     mupirocin ointment (BACTROBAN) 2 %      MYRBETRIQ  50 MG TB24 tablet TAKE (1) TABLET BY MOUTH EVERY DAY 90  tablet 3   omeprazole (PRILOSEC) 40 MG capsule Take 1 capsule by mouth 2 (two) times daily. Take 30 minutes before breakfast and take 30 minutes before dinner.     Phenazopyridine HCl (AZO URINARY PAIN PO) Take by mouth as needed.     sertraline  (ZOLOFT ) 50 MG tablet      No current facility-administered medications for this visit.    OBJECTIVE: Vitals:   12/18/23 1347  BP: 117/85  Pulse: 87  Resp: 16  Temp: 97.6 F (36.4 C)  SpO2: 99%       Body mass index is 24.56 kg/m.    ECOG FS:0 - Asymptomatic  General: Well-developed, well-nourished, no acute distress.  Sitting in a wheelchair. Eyes: Pink conjunctiva, anicteric sclera. HEENT: Normocephalic, moist mucous membranes. Lungs: No audible wheezing or coughing. Heart: Regular rate and rhythm. Abdomen: Soft, nontender, no obvious distention. Musculoskeletal: No edema, cyanosis, or clubbing. Neuro: Alert, answering all questions appropriately. Cranial nerves grossly intact. Skin: No rashes or petechiae noted. Psych: Normal affect.  LAB RESULTS:  Lab Results  Component Value Date   NA 137 05/08/2021   K 3.5 05/08/2021   CL 106 05/08/2021   CO2 22 05/08/2021   GLUCOSE 154 (H) 05/08/2021   BUN 20 05/08/2021   CREATININE 0.84 05/08/2021   CALCIUM  8.7 (L) 05/08/2021   PROT 7.0 01/12/2016   ALBUMIN 3.6 01/12/2016   AST 17 01/12/2016   ALT 12 (L) 01/12/2016   ALKPHOS 68 01/12/2016   BILITOT 0.2 (L) 01/12/2016   GFRNONAA >60 05/08/2021   GFRAA >60 08/25/2016    Lab Results  Component Value Date   WBC 7.7 12/17/2023   NEUTROABS 5.1 12/17/2023   HGB 11.3 (L) 12/17/2023   HCT 34.8 (L) 12/17/2023   MCV 86.1 12/17/2023   PLT 305 12/17/2023   Lab Results  Component Value Date   IRON  30 12/17/2023   TIBC 230 (L) 12/17/2023   IRONPCTSAT 13 12/17/2023   Lab Results  Component Value Date   FERRITIN 138 12/17/2023     STUDIES: No results found.  ASSESSMENT: Iron  deficiency anemia.  PLAN:    Iron  deficiency  anemia: Patient's hemoglobin remains mildly decreased but significantly improved to 11.3.  Her iron  stores are now within normal limits.  She does not require additional Feraheme today.  Patient last received treatment on September 19, 2023.  No intervention is needed.  Return to clinic in 4 months with repeat laboratory work, further evaluation, and sideration of treatment if needed.  I spent a total of 20 minutes reviewing chart data, face-to-face evaluation with the patient, counseling and coordination of care as detailed above.    Patient expressed understanding and was in agreement with this plan. She also understands that She can call clinic at any time with any questions, concerns, or complaints.     Evalene JINNY Reusing, MD  12/18/2023 2:04 PM

## 2024-01-22 DIAGNOSIS — M1612 Unilateral primary osteoarthritis, left hip: Secondary | ICD-10-CM | POA: Diagnosis not present

## 2024-02-11 ENCOUNTER — Other Ambulatory Visit: Payer: Self-pay | Admitting: Urology

## 2024-02-11 DIAGNOSIS — N390 Urinary tract infection, site not specified: Secondary | ICD-10-CM

## 2024-02-12 DIAGNOSIS — M1612 Unilateral primary osteoarthritis, left hip: Secondary | ICD-10-CM | POA: Diagnosis not present

## 2024-02-12 DIAGNOSIS — M47816 Spondylosis without myelopathy or radiculopathy, lumbar region: Secondary | ICD-10-CM | POA: Diagnosis not present

## 2024-02-28 DIAGNOSIS — L0390 Cellulitis, unspecified: Secondary | ICD-10-CM | POA: Diagnosis not present

## 2024-02-28 DIAGNOSIS — I872 Venous insufficiency (chronic) (peripheral): Secondary | ICD-10-CM | POA: Diagnosis not present

## 2024-02-28 DIAGNOSIS — D485 Neoplasm of uncertain behavior of skin: Secondary | ICD-10-CM | POA: Diagnosis not present

## 2024-04-21 ENCOUNTER — Inpatient Hospital Stay

## 2024-04-22 ENCOUNTER — Inpatient Hospital Stay

## 2024-04-22 ENCOUNTER — Inpatient Hospital Stay: Admitting: Oncology

## 2024-05-23 ENCOUNTER — Inpatient Hospital Stay

## 2024-05-26 ENCOUNTER — Inpatient Hospital Stay

## 2024-05-26 ENCOUNTER — Inpatient Hospital Stay: Admitting: Oncology
